# Patient Record
Sex: Female | Born: 1935 | Race: Black or African American | Hispanic: No | Smoking: Never smoker
Health system: Southern US, Community
[De-identification: ages and names within clinical notes are randomized; demographics above are authoritative.]

## PROBLEM LIST (undated history)

## (undated) DIAGNOSIS — E785 Hyperlipidemia, unspecified: Secondary | ICD-10-CM

## (undated) DIAGNOSIS — K219 Gastro-esophageal reflux disease without esophagitis: Secondary | ICD-10-CM

## (undated) DIAGNOSIS — R9439 Abnormal result of other cardiovascular function study: Secondary | ICD-10-CM

## (undated) DIAGNOSIS — E079 Disorder of thyroid, unspecified: Secondary | ICD-10-CM

## (undated) DIAGNOSIS — M069 Rheumatoid arthritis, unspecified: Secondary | ICD-10-CM

## (undated) DIAGNOSIS — Z90710 Acquired absence of both cervix and uterus: Secondary | ICD-10-CM

## (undated) DIAGNOSIS — I10 Essential (primary) hypertension: Secondary | ICD-10-CM

## (undated) DIAGNOSIS — H269 Unspecified cataract: Secondary | ICD-10-CM

## (undated) DIAGNOSIS — G4730 Sleep apnea, unspecified: Secondary | ICD-10-CM

## (undated) DIAGNOSIS — E119 Type 2 diabetes mellitus without complications: Secondary | ICD-10-CM

## (undated) DIAGNOSIS — T7840XA Allergy, unspecified, initial encounter: Secondary | ICD-10-CM

## (undated) DIAGNOSIS — F419 Anxiety disorder, unspecified: Secondary | ICD-10-CM

## (undated) HISTORY — DX: Abnormal result of other cardiovascular function study: R94.39

## (undated) HISTORY — DX: Gastro-esophageal reflux disease without esophagitis: K21.9

## (undated) HISTORY — DX: Essential (primary) hypertension: I10

## (undated) HISTORY — DX: Allergy, unspecified, initial encounter: T78.40XA

## (undated) HISTORY — DX: Type 2 diabetes mellitus without complications: E11.9

## (undated) HISTORY — DX: Unspecified cataract: H26.9

## (undated) HISTORY — DX: Disorder of thyroid, unspecified: E07.9

## (undated) HISTORY — DX: Anxiety disorder, unspecified: F41.9

## (undated) HISTORY — DX: Acquired absence of both cervix and uterus: Z90.710

## (undated) HISTORY — PX: TOTAL ABDOMINAL HYSTERECTOMY: SHX209

## (undated) HISTORY — DX: Hyperlipidemia, unspecified: E78.5

---

## 2003-08-09 ENCOUNTER — Emergency Department (HOSPITAL_COMMUNITY): Admission: EM | Admit: 2003-08-09 | Discharge: 2003-08-09 | Payer: Self-pay | Admitting: Emergency Medicine

## 2004-10-28 ENCOUNTER — Ambulatory Visit: Payer: Self-pay | Admitting: *Deleted

## 2010-07-21 ENCOUNTER — Ambulatory Visit: Payer: Self-pay | Admitting: Rheumatology

## 2010-07-29 ENCOUNTER — Ambulatory Visit: Payer: Self-pay | Admitting: Rheumatology

## 2013-09-19 ENCOUNTER — Encounter: Payer: Self-pay | Admitting: *Deleted

## 2013-09-20 ENCOUNTER — Encounter: Payer: Self-pay | Admitting: Cardiology

## 2013-09-20 ENCOUNTER — Ambulatory Visit (INDEPENDENT_AMBULATORY_CARE_PROVIDER_SITE_OTHER): Payer: Medicare Other | Admitting: Cardiology

## 2013-09-20 VITALS — BP 121/80 | HR 88 | Ht 67.0 in | Wt 190.0 lb

## 2013-09-20 DIAGNOSIS — E119 Type 2 diabetes mellitus without complications: Secondary | ICD-10-CM

## 2013-09-20 DIAGNOSIS — E785 Hyperlipidemia, unspecified: Secondary | ICD-10-CM

## 2013-09-20 DIAGNOSIS — I10 Essential (primary) hypertension: Secondary | ICD-10-CM

## 2013-09-20 DIAGNOSIS — R9439 Abnormal result of other cardiovascular function study: Secondary | ICD-10-CM

## 2013-09-20 HISTORY — DX: Abnormal result of other cardiovascular function study: R94.39

## 2013-09-20 HISTORY — DX: Hyperlipidemia, unspecified: E78.5

## 2013-09-20 HISTORY — DX: Essential (primary) hypertension: I10

## 2013-09-20 HISTORY — DX: Type 2 diabetes mellitus without complications: E11.9

## 2013-09-20 NOTE — Progress Notes (Signed)
[address] [place], [place]  [address] Phone: [phone] Fax:  [phone]  Date:  09/20/2013   ID:  Kristen Weber, DOB [birth year], MRN [id number]  PCP:  No primary provider on file.   History of Present Illness: Kristen Weber is a 79 y.o. female with diabetes, hypertension, hyperlipidemia who underwent nuclear stress test at [facility] interpreted as probably abnormal myocardial perfusion study with a large, mildly severe mid anterior, mid anteroseptal, apical anterior, apical septal and apical nearly completely reversible defect suggestive of probable ischemia or possible attenuation artifact. There is moderate motion artifact, sensitivity, specificity of the study is reduced by the noted artifact. No TID.  Saw Dr. Julio Alm in [place]. Leo Grosser MD is her PCP. She is not having any particular symptoms. No CP, no SOB. One time in the past had mild CP after eating perhaps.   No prior heart issues. Perhaps heart murmur in the past.     Wt Readings from Last 3 Encounters:  09/20/13 190 lb (86.183 kg)     Past Medical History  Diagnosis Date  . H/O: hysterectomy   . Diabetes mellitus     Type 2    No past surgical history on file.  Current Outpatient Prescriptions  Medication Sig Dispense Refill  . GLIPIZIDE XL 10 MG 24 hr tablet Take 10 mg by mouth daily.       . hydrochlorothiazide (HYDRODIURIL) 25 MG tablet Take 25 mg by mouth daily.       Marland Kitchen ibuprofen (ADVIL,MOTRIN) 800 MG tablet Take by mouth as needed.       Marland Kitchen LANTUS SOLOSTAR 100 UNIT/ML SOPN 30 Units daily.       Marland Kitchen losartan (COZAAR) 50 MG tablet Take 50 mg by mouth daily.       Marland Kitchen NITROSTAT 0.4 MG SL tablet Place 0.4 mg under the tongue every 5 (five) minutes as needed.       Marland Kitchen oxyCODONE-acetaminophen (PERCOCET/ROXICET) 5-325 MG per tablet Take by mouth as directed.       . simvastatin (ZOCOR) 20 MG tablet Take 20 mg by mouth at bedtime.       . traMADol (ULTRAM) 50 MG tablet Take 50 mg by mouth as  directed.        No current facility-administered medications for this visit.    Allergies:   No Known Allergies  Social History:  The patient  reports that she has never smoked. She does not have any smokeless tobacco history on file. She reports that she does not drink alcohol or use illicit drugs.   Family History  Problem Relation Age of Onset  . Diabetes Mother     ROS:  Please see the history of present illness.   Denies any bleeding, syncope, orthopnea, PND, rash, dysphagia.   All other systems reviewed and negative.   PHYSICAL EXAM: VS:  BP 121/80  Pulse 88  Ht 5\' 7"  (1.702 m)  Wt 190 lb (86.183 kg)  BMI 29.75 kg/m2 Well nourished, well developed, in no acute distressAmbulates with a cane HEENT: normal, [place]/AT, EOMI Neck: no JVD, normal carotid upstroke, no bruit Cardiac:  normal S1, S2; RRR; no murmur Lungs:  clear to auscultation bilaterally, no wheezing, rhonchi or rales Abd: soft, nontender, no hepatomegaly, no bruits Ext: no edema, 2+ distal pulses Skin: warm and dry GU: deferred Neuro: no focal abnormalities noted, AAO x 3  EKG:  Sinus rhythm, 88, left axis deviation, probable left atrial enlargement  ASSESSMENT AND PLAN:  1. Probably abnormal cardiovascular stress test/nuclear stress test - with equivocal nature of stress test, no TID, ejection fraction 65%, 5 minutes on treadmill with findings consistent with possible mild ischemia versus breast attenuation and current lack of symptoms, no chest pain, no shortness of breath, I would like to continue to monitor her and continue with aggressive primary prevention especially in light of her diabetes. If chest discomfort returns or other worrisome symptoms develop, I will pursue cardiac catheterization. We discussed the risks and benefits of cardiac catheterization including stroke, heart attack, death. I explained to them that if her stress test result or more conclusive of ischemia and if she were having ongoing  chest discomfort/angina that we would certainly be pursuing catheterization. 2. Diabetes-per primary physician. 3. Hypertension-currently well controlled. No changes made.  4. Hyperlipidemia-continue with statin. Excellent prevention. 5. I will see her back in 6 months or sooner if symptoms occur.  Signed, Donato Schultz, MD [facility]  09/20/2013 3:49 PM

## 2013-09-20 NOTE — Patient Instructions (Signed)
Your physician recommends that you continue on your current medications as directed. Please refer to the Current Medication list given to you today.  Your physician wants you to follow-up in: 6 months with Dr. Skains. You will receive a reminder letter in the mail two months in advance. If you don't receive a letter, please call our office to schedule the follow-up appointment.  

## 2013-09-26 ENCOUNTER — Encounter: Payer: Self-pay | Admitting: Cardiology

## 2013-11-27 ENCOUNTER — Encounter: Payer: Self-pay | Admitting: Neurology

## 2013-11-27 ENCOUNTER — Ambulatory Visit (INDEPENDENT_AMBULATORY_CARE_PROVIDER_SITE_OTHER): Payer: Medicare Other | Admitting: Neurology

## 2013-11-27 VITALS — BP 128/80 | HR 101 | Ht 65.0 in | Wt 192.0 lb

## 2013-11-27 DIAGNOSIS — R251 Tremor, unspecified: Secondary | ICD-10-CM | POA: Insufficient documentation

## 2013-11-27 DIAGNOSIS — R259 Unspecified abnormal involuntary movements: Secondary | ICD-10-CM

## 2013-11-27 MED ORDER — CARBIDOPA-LEVODOPA 25-100 MG PO TABS: 1.0000 | ORAL_TABLET | Freq: Three times a day (TID) | ORAL | Status: DC

## 2013-11-27 NOTE — Patient Instructions (Signed)
Overall you are doing fairly well but I do want to suggest a few things today:   Remember to drink plenty of fluid, eat healthy meals and do not skip any meals. Try to eat protein with a every meal and eat a healthy snack such as fruit or nuts in between meals. Try to keep a regular sleep-wake schedule and try to exercise daily, particularly in the form of walking, 20-30 minutes a day, if you can.   As far as your medications are concerned, I would like to suggest the following: 1)Start Sinemet 25-100 1/2 tablet 3 times a day at 8am, noon and 4pm. Do this for one week and then increase to 1 tablet three times a day at 8am, noon and 4pm.   I would like to see you back in 3 to 4 months sooner if we need to. Please call us with any interim questions, concerns, problems, updates or refill requests.   My clinical assistant and will answer any of your questions and relay your messages to me and also relay most of my messages to you.   Our phone number is [phone]. We also have an after hours call service for urgent matters and there is a physician on-call for urgent questions. For any emergencies you know to call 911 or go to the nearest emergency room

## 2013-11-27 NOTE — Progress Notes (Signed)
[facility]    Provider:  Dr Hosie Poisson Referring Provider: Barron Alvine, MD Primary Care Physician:  No primary provider on file.  CC:  tremor  HPI:  Kristen Weber is a 80 y.o. female here as a referral from Dr. Hollice Espy for tremor  Tremor started around a year ago, seems to be getting worse. Unsure if started unilateral or bilateral. No rest tremor, more of an action/postural tremor. Causing difficulty eating. Shaking of hand when holding cup. Handwriting has gotten messier and smaller. No change in voice. Has some muscle stiffness. Feels she has slowed down, not walking as fast, feels she shuffles some. No known history of REM behavior disorder. No aggravating factors for tremors. No change with caffeine. Does not drink EtOH. No family history of tremor. Memory is sharp. Normal sense of smell.   Has history of DM, HTN and chronic arthritic pain.   Review of Systems: Out of a complete 14 system review, the patient complains of only the following symptoms, and all other reviewed systems are negative. Positive weight loss fatigue blurred vision loss of vision eye pain easy bruising snoring restless legs difficulty swallowing tremor feeling cold snoring chest pain trouble swallowing constipation aching muscles cramps change in appetite  History   Social History  . Marital Status: Married    Spouse Name: N/A    Number of Children: N/A  . Years of Education: N/A   Occupational History  . Retired    Social History Main Topics  . Smoking status: Never Smoker   . Smokeless tobacco: Never Used  . Alcohol Use: No  . Drug Use: No  . Sexual Activity: Not on file   Other Topics Concern  . Not on file   Social History Narrative   Patient lives with her husband Greggory Stallion    Patient has 2 children.    Patient is retired.   Patient is right-handed.      Family History  Problem Relation Age of Onset  . Diabetes Mother     Past Medical History  Diagnosis Date  . H/O:  hysterectomy   . Diabetes mellitus     Type 2  . Abnormal cardiovascular stress test 09/20/2013    Possibly abnormal nuclear stress at [facility]-anteroseptal abnormality interpreted as possible breast attenuation as well, motion artifact. Normal EF. No TID. Watching medically  . Diabetes 09/20/2013  . Essential hypertension 09/20/2013  . Hyperlipidemia 09/20/2013    Past Surgical History  Procedure Laterality Date  . Total abdominal hysterectomy      Current Outpatient Prescriptions  Medication Sig Dispense Refill  . GLIPIZIDE XL 10 MG 24 hr tablet Take 10 mg by mouth daily.       . hydrochlorothiazide (HYDRODIURIL) 25 MG tablet Take 25 mg by mouth daily.       Marland Kitchen LANTUS SOLOSTAR 100 UNIT/ML SOPN 30 Units daily.       Marland Kitchen losartan (COZAAR) 50 MG tablet Take 50 mg by mouth daily.       Marland Kitchen NITROSTAT 0.4 MG SL tablet Place 0.4 mg under the tongue every 5 (five) minutes as needed.       Marland Kitchen oxyCODONE-acetaminophen (PERCOCET/ROXICET) 5-325 MG per tablet Take by mouth as directed.       . simvastatin (ZOCOR) 20 MG tablet Take 20 mg by mouth at bedtime.       . traMADol (ULTRAM) 50 MG tablet Take 50 mg by mouth as directed.        No current facility-administered medications  for this visit.    Allergies as of 11/27/2013  . (No Known Allergies)    Vitals: BP 128/80  Pulse 101  Ht 5\' 5"  (1.651 m)  Wt 192 lb (87.091 kg)  BMI 31.95 kg/m2 Last Weight:  Wt Readings from Last 1 Encounters:  11/27/13 192 lb (87.091 kg)   Last Height:   Ht Readings from Last 1 Encounters:  11/27/13 5\' 5"  (1.651 m)     Physical exam: Exam: Gen: NAD, conversant Eyes: anicteric sclerae, moist conjunctivae HENT: Atraumatic, oropharynx clear Neck: Trachea midline; supple,  Lungs: CTA, no wheezing, rales, rhonic                          CV: RRR, no MRG Abdomen: Soft, non-tender;  Extremities: No peripheral edema  Skin: Normal temperature, no rash,  Psych: Appropriate affect, pleasant  Neuro: MS:  AA&Ox3, appropriately interactive, normal affect   Speech: fluent w/o paraphasic error  Memory: good recent and remote recall  CN: PERRL, EOMI no nystagmus, no ptosis, sensation intact to LT V1-V3 bilat, face symmetric, no weakness, hearing grossly intact, palate elevates symmetrically, shoulder shrug 5/5 bilat,  tongue protrudes midline, no fasiculations noted.  Motor: normal bulk and tone Strength: 5/5  In all extremities  Coord: rapid alternating and point-to-point (FNF, HTS) movements intact. No rest tremor noted. No action or postural tremor noted. Mild right greater left intention tremor of hands. Patient is tremor is usually worse in this. Bradykinesia noted in bilateral hands with finger opening and closing, finger tapping and rapid alternating movements. Bradykinesia with foot tapping.   Reflexes: symmetrical, bilat downgoing toes  Sens: LT intact in all extremities  Gait: Stands slowly without assistance, mildly stooped, shuffling gait, appears antalgic gait favoring right leg, decreased arm swing left greater than right no re emergent tremor.  Assessment and Plan:  1)Tremor:   80 year old right-handed woman presenting for initial evaluation of tremor, described only as a traction postural tremor. Minimal intention tremor noted on exam, otherwise no tremor noted. Patient does have multiple parkinsonian features such as bradykinesia, shuffling gait, decreased arm swing. Patient does have strong arthritis history, so unclear how much of the the bradykinesia is due to arthritic changes. Patient's clinical history is consistent more with an essential tremor, but the physical exam shows no signs essential tremor and is positive for some parkinsonian features. There is often an overlap between these 2, though at this time suspect patient likely has an underlying parkinsonism. The possible diagnosis was discussed extensively with patient and her daughter. Expressed understanding. We  discussed different options, including DAT scan versus try patient on low-dose levodopa to see if there is a therapeutic benefit. Benefits and risks of both options were discussed and patient understanding. At this time will try patient on low-dose Sinemet 3 times a day. Patient followup in 3-4 months.   , DO  [facility] [address] [place], [address] [place]  Phone [phone] Fax [phone]

## 2014-02-05 ENCOUNTER — Telehealth: Payer: Self-pay | Admitting: Neurology

## 2014-02-05 NOTE — Telephone Encounter (Signed)
Patient came to the office today to relay that she tried the Sinemet IR for two weeks and didn't see any change, she is no longer taking med.  She just wanted the doctor to know.  She has a revisit on 03-05-14.

## 2014-03-05 ENCOUNTER — Ambulatory Visit (INDEPENDENT_AMBULATORY_CARE_PROVIDER_SITE_OTHER): Payer: Medicare Other | Admitting: Neurology

## 2014-03-05 ENCOUNTER — Encounter: Payer: Self-pay | Admitting: Neurology

## 2014-03-05 VITALS — BP 126/83 | HR 99 | Ht 65.0 in | Wt 194.0 lb

## 2014-03-05 DIAGNOSIS — R259 Unspecified abnormal involuntary movements: Secondary | ICD-10-CM

## 2014-03-05 DIAGNOSIS — R251 Tremor, unspecified: Secondary | ICD-10-CM

## 2014-03-05 MED ORDER — PSYLLIUM 0.52 G PO CAPS: 0.5200 g | ORAL_CAPSULE | Freq: Every day | ORAL | Status: DC

## 2014-03-05 MED ORDER — PROPRANOLOL HCL 10 MG PO TABS: 10.0000 mg | ORAL_TABLET | Freq: Two times a day (BID) | ORAL | Status: DC

## 2014-03-05 NOTE — Progress Notes (Signed)
[facility]    Provider:  Dr Hosie Poisson Referring Provider: No ref. provider found Primary Care Physician:  No primary provider on file.  CC:  tremor  HPI:  Kristen Weber is a 80 y.o. female here as a follow up for tremor. Returns today with continued tremor and concern over continued pain in bilateral knees. Scheduled to follow up for aquatic therapy for treatment. No other acute concerns at this time.   Initial visit 11/2013: Tremor started around a year ago, seems to be getting worse. Unsure if started unilateral or bilateral. No rest tremor, more of an action/postural tremor. Causing difficulty eating. Shaking of hand when holding cup. Handwriting has gotten messier and smaller. No change in voice. Has some muscle stiffness. Feels she has slowed down, not walking as fast, feels she shuffles some. No known history of REM behavior disorder. No aggravating factors for tremors. No change with caffeine. Does not drink EtOH. No family history of tremor. Memory is sharp. Normal sense of smell.   Has history of DM, HTN and chronic arthritic pain.   Review of Systems: Out of a complete 14 system review, the patient complains of only the following symptoms, and all other reviewed systems are negative. Positive loss of vision, joint pain, restless leg, back pain, aching muscles, muscle cramps, walking difficulty, tremors  History   Social History  . Marital Status: Married    Spouse Name: N/A    Number of Children: N/A  . Years of Education: N/A   Occupational History  . Retired    Social History Main Topics  . Smoking status: Never Smoker   . Smokeless tobacco: Never Used  . Alcohol Use: No  . Drug Use: No  . Sexual Activity: Not on file   Other Topics Concern  . Not on file   Social History Narrative   Patient lives with her husband Greggory Stallion    Patient has 2 children.    Patient is retired.   Patient is right-handed.      Family History  Problem Relation Age  of Onset  . Diabetes Mother     Past Medical History  Diagnosis Date  . H/O: hysterectomy   . Diabetes mellitus     Type 2  . Abnormal cardiovascular stress test 09/20/2013    Possibly abnormal nuclear stress at [facility]-anteroseptal abnormality interpreted as possible breast attenuation as well, motion artifact. Normal EF. No TID. Watching medically  . Diabetes 09/20/2013  . Essential hypertension 09/20/2013  . Hyperlipidemia 09/20/2013    Past Surgical History  Procedure Laterality Date  . Total abdominal hysterectomy      Current Outpatient Prescriptions  Medication Sig Dispense Refill  . ACCU-CHEK AVIVA PLUS test strip       . carbidopa-levodopa (SINEMET IR) 25-100 MG per tablet Take 1 tablet by mouth 3 (three) times daily.  90 tablet  6  . GLIPIZIDE XL 10 MG 24 hr tablet Take 10 mg by mouth daily.       . hydrochlorothiazide (HYDRODIURIL) 25 MG tablet Take 25 mg by mouth daily.       Marland Kitchen HYDROcodone-acetaminophen (NORCO/VICODIN) 5-325 MG per tablet       . LANTUS SOLOSTAR 100 UNIT/ML SOPN 30 Units daily.       Marland Kitchen losartan (COZAAR) 50 MG tablet Take 50 mg by mouth daily.       Marland Kitchen NITROSTAT 0.4 MG SL tablet Place 0.4 mg under the tongue every 5 (five) minutes as needed.       Marland Kitchen  oxyCODONE-acetaminophen (PERCOCET/ROXICET) 5-325 MG per tablet Take by mouth as directed.       . polyethylene glycol powder (GLYCOLAX/MIRALAX) powder       . simvastatin (ZOCOR) 20 MG tablet Take 20 mg by mouth at bedtime.       . traMADol (ULTRAM) 50 MG tablet Take 50 mg by mouth as directed.       . propranolol (INDERAL) 10 MG tablet Take 1 tablet (10 mg total) by mouth 2 (two) times daily.  60 tablet  3  . psyllium (REGULOID) 0.52 G capsule Take 1 capsule (0.52 g total) by mouth daily.  30 capsule  6   No current facility-administered medications for this visit.    Allergies as of 03/05/2014  . (No Known Allergies)    Vitals: BP 126/83  Pulse 99  Ht 5\' 5"  (1.651 m)  Wt 194 lb (87.998 kg)   BMI 32.28 kg/m2 Last Weight:  Wt Readings from Last 1 Encounters:  03/05/14 194 lb (87.998 kg)   Last Height:   Ht Readings from Last 1 Encounters:  03/05/14 5\' 5"  (1.651 m)     Physical exam: Exam: Gen: NAD, conversant Eyes: anicteric sclerae, moist conjunctivae HENT: Atraumatic, oropharynx clear Neck: Trachea midline; supple,  Lungs: CTA, no wheezing, rales, rhonic                          CV: RRR, no MRG Abdomen: Soft, non-tender;  Extremities: No peripheral edema  Skin: Normal temperature, no rash,  Psych: Appropriate affect, pleasant  Neuro: MS: AA&Ox3, appropriately interactive, normal affect   Speech: fluent w/o paraphasic error  Memory: good recent and remote recall  CN: PERRL, EOMI no nystagmus, no ptosis, sensation intact to LT V1-V3 bilat, face symmetric, no weakness, hearing grossly intact, palate elevates symmetrically, shoulder shrug 5/5 bilat,  tongue protrudes midline, no fasiculations noted.  Motor: normal bulk and tone Strength: 5/5  In all extremities  Coord: rapid alternating and point-to-point (FNF, HTS) movements intact. No rest tremor noted. No action or postural tremor noted. Mild right greater left intention tremor of hands. Patient states tremor is usually worse than this. Bradykinesia noted in bilateral hands with finger opening and closing, finger tapping and rapid alternating movements. Bradykinesia with foot tapping.   Reflexes: symmetrical, bilat downgoing toes  Sens: LT intact in all extremities  Gait: Stands slowly without assistance, mildly stooped, shuffling gait, appears antalgic gait favoring right leg, decreased arm swing left greater than right no re emergent tremor.  Assessment and Plan:  1)Tremor:   80 year old right-handed woman presenting for initial evaluation of tremor, described only as a action/intention tremor. Minimal intention tremor noted on exam but otherwise unremarkable. Tremor appears very mild but patient notes  it is negatively impacting her quality of life. Based on history and exam suspect this likely represents a mild essential tremor. Patient does have some bradykinesia on exam but this may represent symptoms of arthritis. The possible diagnosis was discussed extensively with patient and her daughter. Expressed understanding. Will try low dose beta blocker, can titrate up as tolerated. Follow up in 6 months.   , DO  [facility] [address] [place], [address] [place]  Phone [phone] Fax [phone]

## 2014-03-05 NOTE — Patient Instructions (Signed)
Overall you are doing fairly well but I do want to suggest a few things today:   Remember to drink plenty of fluid, eat healthy meals and do not skip any meals. Try to eat protein with a every meal and eat a healthy snack such as fruit or nuts in between meals. Try to keep a regular sleep-wake schedule and try to exercise daily, particularly in the form of walking, 20-30 minutes a day, if you can.   As far as your medications are concerned, I would like to suggest the following: 1)Start taking Propanolol 10mg  nightly for 1 week and then increase to 10mg  twice a day  I would like to see you back in 6 months, sooner if we need to. Please call with any interim questions, concerns, problems, updates or refill requests.   Please also call for any test results so we can go over those with you on the phone.  My clinical assistant and will answer any of your questions and relay your messages to me and also relay most of my messages to you.   Our phone number is [phone]. We also have an after hours call service for urgent matters and there is a physician on-call for urgent questions. For any emergencies you know to call 911 or go to the nearest emergency room

## 2014-03-06 ENCOUNTER — Telehealth: Payer: Self-pay | Admitting: Neurology

## 2014-03-06 NOTE — Telephone Encounter (Signed)
Spoke with patient, she states that her Rx for Tramadol 50 mg, has ran out and is requesting another Rx, Please advise

## 2014-03-06 NOTE — Telephone Encounter (Signed)
Informed patient that Rx refill will need to be obtained from prescribing doctor, patient expressed understanding, no further questions or concerns.

## 2014-03-06 NOTE — Telephone Encounter (Signed)
Please let her know that I did not order this and she needs to follow up with the prescribing doctor to get a refill

## 2014-09-05 ENCOUNTER — Ambulatory Visit: Payer: Medicare Other | Admitting: Neurology

## 2014-10-17 ENCOUNTER — Ambulatory Visit
Admission: RE | Admit: 2014-10-17 | Discharge: 2014-10-17 | Disposition: A | Discharge status: Home / Self Care | Payer: Medicare Other | Source: Ambulatory Visit | Attending: Nurse Practitioner | Admitting: Nurse Practitioner

## 2014-10-17 ENCOUNTER — Encounter: Payer: Self-pay | Admitting: Nurse Practitioner

## 2014-10-17 ENCOUNTER — Telehealth: Payer: Self-pay

## 2014-10-17 ENCOUNTER — Other Ambulatory Visit: Payer: Self-pay | Admitting: Nurse Practitioner

## 2014-10-17 ENCOUNTER — Inpatient Hospital Stay: Admission: RE | Admit: 2014-10-17 | Payer: Medicare Other | Source: Ambulatory Visit

## 2014-10-17 ENCOUNTER — Ambulatory Visit (INDEPENDENT_AMBULATORY_CARE_PROVIDER_SITE_OTHER): Payer: Medicare Other | Admitting: Nurse Practitioner

## 2014-10-17 VITALS — BP 110/80 | HR 97 | Ht 63.0 in | Wt 190.1 lb

## 2014-10-17 DIAGNOSIS — E119 Type 2 diabetes mellitus without complications: Secondary | ICD-10-CM

## 2014-10-17 DIAGNOSIS — I209 Angina pectoris, unspecified: Secondary | ICD-10-CM

## 2014-10-17 DIAGNOSIS — Z5181 Encounter for therapeutic drug level monitoring: Secondary | ICD-10-CM

## 2014-10-17 DIAGNOSIS — E785 Hyperlipidemia, unspecified: Secondary | ICD-10-CM

## 2014-10-17 DIAGNOSIS — I10 Essential (primary) hypertension: Secondary | ICD-10-CM

## 2014-10-17 DIAGNOSIS — R9439 Abnormal result of other cardiovascular function study: Secondary | ICD-10-CM

## 2014-10-17 DIAGNOSIS — R9389 Abnormal findings on diagnostic imaging of other specified body structures: Secondary | ICD-10-CM

## 2014-10-17 LAB — LIPID PANEL
Cholesterol: 184 mg/dL (ref 0–200)
HDL: 44.8 mg/dL (ref >39.00)
NonHDL: 139.2
Total CHOL/HDL Ratio: 4
Triglycerides: 311 mg/dL — ABNORMAL HIGH (ref 0.0–149.0)
VLDL: 62.2 mg/dL — ABNORMAL HIGH (ref 0.0–40.0)

## 2014-10-17 LAB — PROTIME-INR
INR: 1.2 ratio — ABNORMAL HIGH (ref 0.8–1.0)
Prothrombin Time: 13.5 s — ABNORMAL HIGH (ref 9.6–13.1)

## 2014-10-17 LAB — BASIC METABOLIC PANEL
BUN: 16 mg/dL (ref 6–23)
CO2: 28 mEq/L (ref 19–32)
Calcium: 10.4 mg/dL (ref 8.4–10.5)
Chloride: 98 mEq/L (ref 96–112)
Creatinine, Ser: 1.1 mg/dL (ref 0.4–1.2)
GFR: 61.12 mL/min (ref >60.00)
Glucose, Bld: 105 mg/dL — ABNORMAL HIGH (ref 70–99)
Potassium: 3.9 mEq/L (ref 3.5–5.1)
Sodium: 135 mEq/L (ref 135–145)

## 2014-10-17 LAB — HEPATIC FUNCTION PANEL
ALT: 12 U/L (ref 0–35)
AST: 19 U/L (ref 0–37)
Albumin: 3.7 g/dL (ref 3.5–5.2)
Alkaline Phosphatase: 69 U/L (ref 39–117)
Bilirubin, Direct: 0 mg/dL (ref 0.0–0.3)
Total Bilirubin: 0.4 mg/dL (ref 0.2–1.2)
Total Protein: 7.6 g/dL (ref 6.0–8.3)

## 2014-10-17 LAB — CBC
HCT: 39.7 % (ref 36.0–46.0)
Hemoglobin: 12.9 g/dL (ref 12.0–15.0)
MCHC: 32.5 g/dL (ref 30.0–36.0)
MCV: 88.2 fl (ref 78.0–100.0)
Platelets: 299 10*3/uL (ref 150.0–400.0)
RBC: 4.5 Mil/uL (ref 3.87–5.11)
RDW: 14.4 % (ref 11.5–15.5)
WBC: 3.6 10*3/uL — ABNORMAL LOW (ref 4.0–10.5)

## 2014-10-17 LAB — LDL CHOLESTEROL, DIRECT: Direct LDL: 64.8 mg/dL

## 2014-10-17 LAB — APTT: aPTT: 36 s — ABNORMAL HIGH (ref 23.4–32.7)

## 2014-10-17 LAB — HEMOGLOBIN A1C: Hgb A1c MFr Bld: 7.9 % — ABNORMAL HIGH (ref 4.6–6.5)

## 2014-10-17 MED ORDER — METOPROLOL TARTRATE 25 MG PO TABS: 25.0000 mg | ORAL_TABLET | Freq: Two times a day (BID) | ORAL | Status: DC

## 2014-10-17 MED ORDER — NITROGLYCERIN 0.4 MG SL SUBL: 0.4000 mg | SUBLINGUAL_TABLET | SUBLINGUAL | Status: DC | PRN

## 2014-10-17 MED ORDER — ISOSORBIDE MONONITRATE ER 30 MG PO TB24: 30.0000 mg | ORAL_TABLET | Freq: Every day | ORAL | Status: DC

## 2014-10-17 NOTE — Telephone Encounter (Signed)
Patient called no answer.Left message on personal answering machine to call me.I received message from Norma Fredrickson NP you need to have a chest ct this Friday 10/19/14.Call me back for time.

## 2014-10-17 NOTE — Progress Notes (Addendum)
Kristen Weber Date of Birth: 25-Aug-1934 Medical Record #[id number]  History of Present Illness: Kristen Weber is seen today for a work in visit. Seen for Dr. Anne Fu. She is a 81 y.o. female with diabetes, hypertension, hyperlipidemia, OA and tremor who underwent nuclear stress test noted in June of 2014 at [facility] interpreted as probably abnormal myocardial perfusion study with a large, mildly severe mid anterior, mid anteroseptal, apical anterior, apical septal and apical nearly completely reversible defect suggestive of probable ischemia or possible attenuation artifact. There is moderate motion artifact, sensitivity, specificity of the study is reduced by the noted artifact. No TID. She has been managed medically. She previously had no symptoms reported.  Saw Dr. Anne Fu a little over one year ago. He favored continued medical management but noted that he would pursue cardiac cath if she became symptomatic.   Comes in today. Here with her daughter. She has basically done ok over the past year until about 3 weeks ago. Has started having "heaviness" over her left breast - typically occurs in the mornings but she can have later in the day. She has associated shortness of breath. No radiation of the discomfort. She has more fatigue. Sometimes will last a few minutes - other times up to an hour or so. She has not used NTG - did not think to use that. Symptoms occur at rest. She does not exercise. Limited by arthritis. BP ok. Says her sugars are ok but not able to tell me her A1C and does not check her sugars. She was planning on having cataract surgery on Monday.   Current Outpatient Prescriptions  Medication Sig Dispense Refill  . ACCU-CHEK AVIVA PLUS test strip     . aspirin 81 MG tablet Take 81 mg by mouth.    . carbidopa-levodopa (SINEMET IR) 25-100 MG per tablet Take 1 tablet by mouth 3 (three) times daily. 90 tablet 6  . GLIPIZIDE XL 10 MG 24 hr tablet Take 10 mg by mouth daily.     Marland Kitchen glucose blood  test strip     . hydrochlorothiazide (HYDRODIURIL) 25 MG tablet Take 25 mg by mouth daily.     Marland Kitchen LANTUS SOLOSTAR 100 UNIT/ML SOPN 30 Units daily.     Marland Kitchen losartan (COZAAR) 50 MG tablet Take 50 mg by mouth daily.     Marland Kitchen NITROSTAT 0.4 MG SL tablet Place 0.4 mg under the tongue every 5 (five) minutes as needed.     Marland Kitchen omeprazole (PRILOSEC) 20 MG capsule Take 20 mg by mouth.    . oxyCODONE-acetaminophen (PERCOCET/ROXICET) 5-325 MG per tablet Take by mouth as directed.     . propranolol (INDERAL) 10 MG tablet Take 1 tablet (10 mg total) by mouth 2 (two) times daily. 60 tablet 3  . senna-docusate (SENOKOT-S) 8.6-50 MG per tablet Take by mouth.    . simvastatin (ZOCOR) 20 MG tablet Take 20 mg by mouth at bedtime.     . traMADol (ULTRAM) 50 MG tablet Take 50 mg by mouth as directed.      No current facility-administered medications for this visit.    Allergies  Allergen Reactions  . Lisinopril Cough    cough    Past Medical History  Diagnosis Date  . H/O: hysterectomy   . Diabetes mellitus     Type 2  . Abnormal cardiovascular stress test 09/20/2013    Possibly abnormal nuclear stress at [place]-anteroseptal abnormality interpreted as possible breast attenuation as well, motion artifact. Normal EF. No TID. Watching medically  .  Diabetes 09/20/2013  . Essential hypertension 09/20/2013  . Hyperlipidemia 09/20/2013    Past Surgical History  Procedure Laterality Date  . Total abdominal hysterectomy      History  Smoking status  . Never Smoker   Smokeless tobacco  . Never Used    History  Alcohol Use No    Family History  Problem Relation Age of Onset  . Diabetes Mother     Review of Systems: The review of systems is per the HPI.  All other systems were reviewed and are negative.  Physical Exam: BP 110/80 mmHg  Pulse 97  Ht 5\' 3"  (1.6 m)  Wt 190 lb 1.9 oz (86.238 kg)  BMI 33.69 kg/m2 Patient is very pleasant and in no acute distress. Skin is warm and dry. Color is  normal.  HEENT is unremarkable. Normocephalic/atraumatic. PERRL. Sclera are nonicteric. Neck is supple. No masses. No JVD. Lungs are clear. Cardiac exam shows a regular rate and rhythm -  Occasional ectopic.  Abdomen is obese but soft. Extremities are without edema. Gait and ROM are intact. Using a cane. No gross neurologic deficits noted.  Wt Readings from Last 3 Encounters:  10/17/14 190 lb 1.9 oz (86.238 kg)  03/05/14 194 lb (87.998 kg)  11/27/13 192 lb (87.091 kg)    LABORATORY DATA/PROCEDURES: EKG today with NSR - PVC  No results found for: WBC, HGB, HCT, PLT, GLUCOSE, CHOL, TRIG, HDL, LDLDIRECT, LDLCALC, ALT, AST, NA, K, CL, CREATININE, BUN, CO2, TSH, PSA, INR, GLUF, HGBA1C, MICROALBUR  BNP (last 3 results) No results for input(s): PROBNP in the last 8760 hours.  Nuclear Cardiology Report From Care Everywhere Dated June 2014 Impression: 1.  Probably Abnormal myocardial perfusion study. 2.  There is a large, mildly severe mid anterior, mid anteroseptal, apical  anterior, apical septal and apical nearly completely reversible defect  suggestive of probable ischemia or possible attenuation artifact. 3.  There is moderate motion artifact, sensitivity specificity of the  study is reduced by the noted artifact.  Ordering Provider:  July 2014 Indication:     80 y.o. female presents with complaint of chest pain. Cardiac Risk Factors: Diabetic, hypertension, hyperlipidemia  Procedure:  SPECT myocardial perfusion with rest and stress imaging  Technique:  The patient was brought to the nuclear medicine area and after  informa consent was obtained an IV was started.  The patient then received   10.7 mCi of Tc 99-m sestamibi.  Approximately 30 minutes later resting  images were acquired.  Patient then underwent stress testing utilizing  Bruce protocol and achieved target heart.  At peak stress the patient was  injected with 32.0 mCi of  Tc 99-m sestamibi.  Approximately one hour  later  stress images were obtained.   EKG findings: Baseline EKG Findings: Normal sinus rhythm with occasional PVC Baseline Heart Rate:  88 Baseline BP   110/78 Target Heart Rate:  118 Peak Heart Rate:  136 Peak Blood Pressure:  182/90 Exercise Time:  5 minutes and 0 seconds Workload:   7.0 METs Chest Pain:   none Arrhythmias:   PVCs EKG Findings:   Patient had bigeminy the start of exercise which resolved  after 1 minute.  Nuclear images findings: Rest and stress images were reviewed.  There was a large size, mild in  severity mid anterior, apical anterior, mid anteroseptal, apical septal  and apical partially reversible defect suggestive of probable ischemia or  possible attenuation.  Review of raw data suggest motion artifact.  TID  estimate  of 0.86.  Summed stress score of 13, Summed rest score of 2, and  Summed difference score of 11.  Gated imaging revealed no wall motion with  an estimated ejection fraction of greater than 65%.  Right ventricle is  noted to be normal in size and function.    Assessment / Plan: 1. Chest pain -  Most likely angina - has numerous CV risk factors and prior abnormal Myoview. Stopping Inderal. Start Lopressor 25 mg BID. Start Imdur 30 mg a day. Use NTG prn and to go to the ER if she has used 3 NTG in 15 minutes. Will arrange for cardiac catheterization - arranged with Dr. Eldridge Dace on Monday at 9am. CXR and labs today. The procedure, risks and benefits have been reviewed and she is willing to proceed.   2. Prior abnormal Myoview -  Now with symptoms - will proceed on with cardiac cath  3. HTN -  BP ok on current regimen.  4. DM  5. HLD - check lipids today.   Further disposition to follow.   Patient is agreeable to this plan and will call if any problems develop in the interim.   Rosalio Macadamia, RN, ANP-C [facility] [address] [place], [place]  [address] [phone]  Addendum:  Patient was  sent for CXR today as part of her pre op - result is as follows:  COMPARISON: PA and lateral chest x-ray dated June 17, 2010  FINDINGS: There is new abnormally increased density inferior and lateral to the left hilum. It is difficult to visualize on the lateral film. It measures 2.7 x 3.4 cm The lung markings are coarse bilaterally in the lower lung zones and there is scarring in the lateral costophrenic gutters. There is no significant pleural effusion. The heart and pulmonary vascularity exhibit no acute abnormalities. Central prominence of the right pulmonary artery is stable. The trachea is midline. The bony thorax exhibits no acute abnormality.  IMPRESSION: 1. There is new increased density in the mid to lower left lung. This may lie posteriorly in the lower lobe images difficult to visualize on the lateral film. This may reflect local pneumonia or a parenchymal mass. Chest CT scanning is recommended. 2. There is no evidence of CHF.  These results will be called to the ordering clinician or representative by the Radiologist Assistant, and communication documented in the PACS or zVision Dashboard.   Electronically Signed  By: David Swaziland  On: 10/17/2014 10:37  This has been reviewed with Dr. Ladona Ridgel (DOD). Will try to proceed with CT of the chest to further define - have attempted 4 x to contact the patient and have left instructions for her to call us back so we can try to complete prior to the cardiac cath. Our office is open Friday and we have availability to proceed with CT on Friday.   Rosalio Macadamia, RN, ANP-C [facility] [address] [place], [place]  [address] [phone]

## 2014-10-17 NOTE — Telephone Encounter (Signed)
Patient's daughter Steward Drone called no answer.Left message on personal answering machine to call me.Mother needs to have a chest ct this Friday 10/19/14.Call me for time.

## 2014-10-17 NOTE — Patient Instructions (Addendum)
We will be checking the following labs today BMET, CBC, PT, PTT, HPF, lipids, A1C  Stay on your current medicines but stop Inderal  Start Lopressor 25 mg twice a day  Start Imdur 30 mg a day  Use your NTG under your tongue for recurrent chest pain. May take one tablet every 5 minutes. If you are still having discomfort after 3 tablets in 15 minutes, call 911.  Please go to [facility] to [facility] on the first floor for a chest Xray - you may walk in.   Your provider has recommended a cardiac catherization  You are scheduled for a cardiac catheterization on Monday, November 30th at [facility] with Dr. Eldridge Dace or associate.  Go to [facility] 2nd Floor Short Stay on Monday, November 30th at [facility] thru the [employer] entrance A No food or drink after midnight on Sunday You may take your medications with a sip of water on the day of your procedure but do not take your Glipizide  1/2 dose insulin on Sunday night  Coronary Angiogram A coronary angiogram, also called coronary angiography, is an X-ray procedure used to look at the arteries in the heart. In this procedure, a dye (contrast dye) is injected through a long, hollow tube (catheter). The catheter is about the size of a piece of cooked spaghetti and is inserted through your groin, wrist, or arm. The dye is injected into each artery, and X-rays are then taken to show if there is a blockage in the arteries of your heart.  LET [facility] CARE PROVIDER KNOW ABOUT:  Any allergies you have, including allergies to shellfish or contrast dye.   All medicines you are taking, including vitamins, herbs, eye drops, creams, and over-the-counter medicines.   Previous problems you or members of your family have had with the use of anesthetics.   Any blood disorders you have.   Previous surgeries you have had.  History of kidney problems or failure.   Other medical conditions you have.  RISKS AND COMPLICATIONS   Generally, a coronary angiogram is a safe procedure. However, about 1 person out of 1000 can have problems that may include:  Allergic reaction to the dye.  Bleeding/bruising from the access site or other locations.  Kidney injury, especially in people with impaired kidney function.  Stroke (rare).  Heart attack (rare).  Irregular rhythms (rare)  Death (rare)  BEFORE THE PROCEDURE   Do not eat or drink anything after midnight the night before the procedure or as directed by your health care provider.   Ask your health care provider about changing or stopping your regular medicines. This is especially important if you are taking diabetes medicines or blood thinners.  PROCEDURE  You may be given a medicine to help you relax (sedative) before the procedure. This medicine is given through an intravenous (IV) access tube that is inserted into one of your veins.   The area where the catheter will be inserted will be washed and shaved. This is usually done in the groin but may be done in the fold of your arm (near your elbow) or in the wrist.   A medicine will be given to numb the area where the catheter will be inserted (local anesthetic).   The health care provider will insert the catheter into an artery. The catheter will be guided by using a special type of X-ray (fluoroscopy) of the blood vessel being examined.   A special dye will then be injected into  the catheter, and X-rays will be taken. The dye will help to show where any narrowing or blockages are located in the heart arteries.    AFTER THE PROCEDURE   If the procedure is done through the leg, you will be kept in bed lying flat for several hours. You will be instructed to not bend or cross your legs.  The insertion site will be checked frequently.   The pulse in your feet or wrist will be checked frequently.   Additional blood tests, X-rays, and an electrocardiogram may be done.    Call the [facility] office at [phone] if you have any questions, problems or concerns.

## 2014-10-19 ENCOUNTER — Ambulatory Visit (INDEPENDENT_AMBULATORY_CARE_PROVIDER_SITE_OTHER)
Admission: RE | Admit: 2014-10-19 | Discharge: 2014-10-19 | Disposition: A | Discharge status: Home / Self Care | Payer: Medicare Other | Source: Ambulatory Visit | Attending: Nurse Practitioner | Admitting: Nurse Practitioner

## 2014-10-19 ENCOUNTER — Telehealth: Payer: Self-pay

## 2014-10-19 ENCOUNTER — Telehealth: Payer: Self-pay | Admitting: Nurse Practitioner

## 2014-10-19 DIAGNOSIS — R938 Abnormal findings on diagnostic imaging of other specified body structures: Secondary | ICD-10-CM

## 2014-10-19 MED ORDER — IOHEXOL 300 MG/ML  SOLN
80.0000 mL | Freq: Once | INTRAMUSCULAR | Status: AC | PRN
  Administered 2014-10-19: 80 mL via INTRAVENOUS

## 2014-10-19 NOTE — Telephone Encounter (Signed)
Spoke to patient's daughter Steward Drone someone already called her .Stated she has Ct chest scheduled this morning at 10:00 am.

## 2014-10-22 ENCOUNTER — Encounter (HOSPITAL_COMMUNITY): Admission: RE | Payer: Self-pay | Source: Ambulatory Visit

## 2014-10-22 ENCOUNTER — Telehealth: Payer: Self-pay | Admitting: *Deleted

## 2014-10-22 ENCOUNTER — Ambulatory Visit (HOSPITAL_COMMUNITY)
Admission: RE | Admit: 2014-10-22 | Payer: Medicare Other | Source: Ambulatory Visit | Admitting: Interventional Cardiology

## 2014-10-22 SURGERY — LEFT HEART CATHETERIZATION WITH CORONARY ANGIOGRAM
Anesthesia: LOCAL

## 2014-10-22 NOTE — Telephone Encounter (Signed)
I would like to keep this visit to recheck her.  Is she taking the Imdur and Lopressor as prescribed - would continue this as well.

## 2014-10-22 NOTE — Telephone Encounter (Signed)
S/w pt's daughter is taking medication currently prescribed at o/v.  Is agreeable to plan will come in on December 18 for f/u ov.

## 2014-10-22 NOTE — Telephone Encounter (Signed)
S/w pt's daughter brought pt to [facility] ER and was treated for pneumonia given antibiotic. Pt is feeling better,  pain in chest subsided,  has a little cough.  Daughter stated since pt is feeling better does pt need to come back in for scheduled f/u appointment to discuss cath.  Will route to Norma Fredrickson, NP, to advise.

## 2014-11-09 ENCOUNTER — Ambulatory Visit: Payer: Medicare Other | Admitting: Nurse Practitioner

## 2014-11-14 ENCOUNTER — Ambulatory Visit: Payer: Medicare Other | Admitting: Nurse Practitioner

## 2014-12-13 ENCOUNTER — Encounter: Payer: Self-pay | Admitting: Cardiology

## 2014-12-13 ENCOUNTER — Telehealth: Payer: Self-pay | Admitting: Cardiology

## 2014-12-13 ENCOUNTER — Ambulatory Visit (INDEPENDENT_AMBULATORY_CARE_PROVIDER_SITE_OTHER): Payer: Medicare Other | Admitting: Cardiology

## 2014-12-13 VITALS — BP 114/72 | HR 87 | Ht 63.0 in | Wt 191.0 lb

## 2014-12-13 DIAGNOSIS — E785 Hyperlipidemia, unspecified: Secondary | ICD-10-CM

## 2014-12-13 DIAGNOSIS — R9439 Abnormal result of other cardiovascular function study: Secondary | ICD-10-CM

## 2014-12-13 DIAGNOSIS — I10 Essential (primary) hypertension: Secondary | ICD-10-CM

## 2014-12-13 NOTE — Progress Notes (Signed)
[name] Date of Birth: 13-Dec-1933 Medical Record #[id number]  History of Present Illness: Kristen Weber is a 81 y.o. female with diabetes, hypertension, hyperlipidemia, OA and tremor who underwent nuclear stress test noted in June of 2014 at [facility] interpreted as probably abnormal myocardial perfusion study with a large, mildly severe mid anterior, mid anteroseptal, apical anterior, apical septal and apical nearly completely reversible defect suggestive of probable ischemia or possible attenuation artifact. There is moderate motion artifact, sensitivity, specificity of the study is reduced by the noted artifact. No TID. She has been managed medically. She previously had no symptoms reported.  I saw her a little over one year ago. I favored continued medical management but noted that we would pursue cardiac cath if she became symptomatic.   She ended up seeing Norma Fredrickson with her daughter complaining of heaviness over her left breast typically occurs in the mornings but she can have later in the day. She has associated shortness of breath. No radiation of the discomfort. She has more fatigue. Sometimes will last a few minutes - other times up to an hour or so. She has not used NTG - did not think to use that. Symptoms occur at rest. She does not exercise. Limited by arthritis. BP ok. Says her sugars are ok but not able to tell me her A1C and does not check her sugars. She was planning on having cataract surgery.   During that discussion, cardiac catheterization was set up however she had a chest x-ray which showed a left lower lobe mass which was then evaluated with CT scan of chest which showed possible developing left lower lobe pneumonia. She was referred to her primary physician for further treatment.  She is here today to discuss once again cardiac catheterization.  She states that she is no longer having any chest discomfort and would like to hold off on catheterization.  Current  Outpatient Prescriptions  Medication Sig Dispense Refill  . ACCU-CHEK AVIVA PLUS test strip     . aspirin 81 MG tablet Take 81 mg by mouth.    . DUREZOL 0.05 % EMUL   2  . GLIPIZIDE XL 10 MG 24 hr tablet Take 10 mg by mouth daily.     Marland Kitchen glucose blood test strip     . hydrochlorothiazide (HYDRODIURIL) 25 MG tablet Take 25 mg by mouth daily.     . ILEVRO 0.3 % SUSP   4  . isosorbide mononitrate (IMDUR) 30 MG 24 hr tablet Take 1 tablet (30 mg total) by mouth daily. 30 tablet 3  . LANTUS SOLOSTAR 100 UNIT/ML SOPN 30 Units daily.     Marland Kitchen losartan (COZAAR) 50 MG tablet Take 50 mg by mouth daily.     . metoprolol tartrate (LOPRESSOR) 25 MG tablet Take 1 tablet (25 mg total) by mouth 2 (two) times daily. (Patient taking differently: Take 25 mg by mouth daily. ) 60 tablet 3  . nitroGLYCERIN (NITROSTAT) 0.4 MG SL tablet Place 1 tablet (0.4 mg total) under the tongue every 5 (five) minutes as needed. 25 tablet 6  . omeprazole (PRILOSEC) 20 MG capsule Take 20 mg by mouth.    . senna-docusate (SENOKOT-S) 8.6-50 MG per tablet Take by mouth.    . simvastatin (ZOCOR) 40 MG tablet Take 40 mg by mouth daily.  5  . traMADol (ULTRAM) 50 MG tablet Take 50 mg by mouth as directed.      No current facility-administered medications for this visit.    Allergies  Allergen Reactions  . Lisinopril Cough    cough    Past Medical History  Diagnosis Date  . H/O: hysterectomy   . Diabetes mellitus     Type 2  . Abnormal cardiovascular stress test 09/20/2013    Possibly abnormal nuclear stress at [place]-anteroseptal abnormality interpreted as possible breast attenuation as well, motion artifact. Normal EF. No TID. Watching medically  . Diabetes 09/20/2013  . Essential hypertension 09/20/2013  . Hyperlipidemia 09/20/2013    Past Surgical History  Procedure Laterality Date  . Total abdominal hysterectomy      History  Smoking status  . Never Smoker   Smokeless tobacco  . Never Used    History    Alcohol Use No    Family History  Problem Relation Age of Onset  . Diabetes Mother     Review of Systems: The review of systems is per the HPI.  All other systems were reviewed and are negative.  Physical Exam: BP 114/72 mmHg  Pulse 87  Ht 5\' 3"  (1.6 m)  Wt 191 lb (86.637 kg)  BMI 33.84 kg/m2 Patient is very pleasant and in no acute distress. Skin is warm and dry. Color is normal.  HEENT is unremarkable. Normocephalic/atraumatic. PERRL. Sclera are nonicteric. Neck is supple. No masses. No JVD. Lungs are clear. Cardiac exam shows a regular rate and rhythm -  Occasional ectopic.  Abdomen is obese but soft. Extremities are without edema. Gait and ROM are intact. Using a cane. No gross neurologic deficits noted.  Wt Readings from Last 3 Encounters:  12/13/14 191 lb (86.637 kg)  10/17/14 190 lb 1.9 oz (86.238 kg)  03/05/14 194 lb (87.998 kg)    LABORATORY DATA/PROCEDURES: EKG  with NSR - PVC  Lab Results  Component Value Date   WBC 3.6* 10/17/2014   HGB 12.9 10/17/2014   HCT 39.7 10/17/2014   PLT 299.0 10/17/2014   GLUCOSE 105* 10/17/2014   CHOL 184 10/17/2014   TRIG 311.0* 10/17/2014   HDL 44.80 10/17/2014   LDLDIRECT 64.8 10/17/2014   ALT 12 10/17/2014   AST 19 10/17/2014   NA 135 10/17/2014   K 3.9 10/17/2014   CL 98 10/17/2014   CREATININE 1.1 10/17/2014   BUN 16 10/17/2014   CO2 28 10/17/2014   INR 1.2* 10/17/2014   HGBA1C 7.9* 10/17/2014   Nuclear Cardiology Report From Care Everywhere Dated June 2014 Impression: 1.  Probably Abnormal myocardial perfusion study. 2.  There is a large, mildly severe mid anterior, mid anteroseptal, apical  anterior, apical septal and apical nearly completely reversible defect  suggestive of probable ischemia or possible attenuation artifact. 3.  There is moderate motion artifact, sensitivity specificity of the  study is reduced by the noted artifact.  Ordering Provider:  July 2014 Indication:     81 y.o. female presents  with complaint of chest pain. Cardiac Risk Factors: Diabetic, hypertension, hyperlipidemia  Procedure:  SPECT myocardial perfusion with rest and stress imaging    EKG findings: Baseline EKG Findings: Normal sinus rhythm with occasional PVC Baseline Heart Rate:  88 Baseline BP   110/78 Target Heart Rate:  118 Peak Heart Rate:  136 Peak Blood Pressure:  182/90 Exercise Time:  5 minutes and 0 seconds Workload:   7.0 METs Chest Pain:   none Arrhythmias:   PVCs EKG Findings:   Patient had bigeminy the start of exercise which resolved  after 1 minute.  Nuclear images findings: Rest and stress images were reviewed.  There was a  large size, mild in  severity mid anterior, apical anterior, mid anteroseptal, apical septal  and apical partially reversible defect suggestive of probable ischemia or  possible attenuation.  Review of raw data suggest motion artifact.  TID  estimate of 0.86.  Summed stress score of 13, Summed rest score of 2, and  Summed difference score of 11.  Gated imaging revealed no wall motion with  an estimated ejection fraction of greater than 65%.  Right ventricle is  noted to be normal in size and function.    Assessment / Plan:  1. Chest pain -   has numerous CV risk factors and prior abnormal Myoview.  Lopressor 25 mg BID. Started Imdur 30 mg a day. Use NTG prn and to go to the ER if she has used 3 NTG in 15 minutes. Her previous catheterization was canceled secondary to left lower lobe pneumonia. She does not wish to proceed with catheterization at this time. I will have low threshold for ordering catheterization. I described to her risks and benefits.  2. Prior abnormal Myoview -  Now with symptoms -offered catheterization but she did not wish to proceed. She feels better now that pneumonia is cleared.  3. HTN -  BP ok on current regimen.  4. DM  5. HLD -  LDL 64  Patient is agreeable to this plan and will call if any problems develop in the interim.  We  will see her back in 6 months.  Donato Schultz, MD [facility] [address] [place], [place]  [address] [phone]

## 2014-12-13 NOTE — Patient Instructions (Signed)
The current medical regimen is effective;  continue present plan and medications.  Follow up in 6 months with Dr. Skains.  You will receive a letter in the mail 2 months before you are due.  Please call us when you receive this letter to schedule your follow up appointment.  Thank you for choosing [facility]!!     

## 2014-12-13 NOTE — Telephone Encounter (Signed)
New Msg         Pt daughter calling, pt was medication for tremors a few months ago.   Pt daughter wants to know if medication that Norma Fredrickson prescribed helps with tremors as well?   Please return call.

## 2014-12-13 NOTE — Telephone Encounter (Signed)
Spoke with daughter who wanted to know the name of the medication that Lawson Fiscal stopped at the office visit in 09/2014.  Advised Inderal was stopped at that time.

## 2014-12-14 ENCOUNTER — Ambulatory Visit: Payer: Self-pay | Admitting: Cardiology

## 2015-02-11 ENCOUNTER — Ambulatory Visit (INDEPENDENT_AMBULATORY_CARE_PROVIDER_SITE_OTHER): Payer: Medicare Other | Admitting: Nurse Practitioner

## 2015-02-11 ENCOUNTER — Other Ambulatory Visit: Payer: Self-pay | Admitting: Nurse Practitioner

## 2015-02-11 ENCOUNTER — Observation Stay (HOSPITAL_COMMUNITY)
Admission: AD | Admit: 2015-02-11 | Discharge: 2015-02-12 | Disposition: A | Discharge status: Home / Self Care | Payer: Medicare Other | Source: Ambulatory Visit | Attending: Cardiology | Admitting: Cardiology

## 2015-02-11 ENCOUNTER — Ambulatory Visit (HOSPITAL_COMMUNITY): Admit: 2015-02-11 | Payer: Self-pay | Admitting: Cardiology

## 2015-02-11 ENCOUNTER — Encounter: Payer: Self-pay | Admitting: Nurse Practitioner

## 2015-02-11 ENCOUNTER — Encounter (HOSPITAL_COMMUNITY): Payer: Self-pay

## 2015-02-11 VITALS — BP 142/90 | HR 80 | Ht 64.0 in | Wt 194.8 lb

## 2015-02-11 DIAGNOSIS — R9439 Abnormal result of other cardiovascular function study: Secondary | ICD-10-CM | POA: Diagnosis not present

## 2015-02-11 DIAGNOSIS — M1990 Unspecified osteoarthritis, unspecified site: Secondary | ICD-10-CM | POA: Insufficient documentation

## 2015-02-11 DIAGNOSIS — G4733 Obstructive sleep apnea (adult) (pediatric): Secondary | ICD-10-CM | POA: Diagnosis not present

## 2015-02-11 DIAGNOSIS — E119 Type 2 diabetes mellitus without complications: Secondary | ICD-10-CM | POA: Insufficient documentation

## 2015-02-11 DIAGNOSIS — Z79899 Other long term (current) drug therapy: Secondary | ICD-10-CM | POA: Insufficient documentation

## 2015-02-11 DIAGNOSIS — R079 Chest pain, unspecified: Secondary | ICD-10-CM

## 2015-02-11 DIAGNOSIS — I10 Essential (primary) hypertension: Secondary | ICD-10-CM | POA: Diagnosis not present

## 2015-02-11 DIAGNOSIS — I25110 Atherosclerotic heart disease of native coronary artery with unstable angina pectoris: Secondary | ICD-10-CM | POA: Diagnosis not present

## 2015-02-11 DIAGNOSIS — I200 Unstable angina: Secondary | ICD-10-CM

## 2015-02-11 DIAGNOSIS — R251 Tremor, unspecified: Secondary | ICD-10-CM | POA: Insufficient documentation

## 2015-02-11 DIAGNOSIS — Z794 Long term (current) use of insulin: Secondary | ICD-10-CM | POA: Diagnosis not present

## 2015-02-11 DIAGNOSIS — Z9079 Acquired absence of other genital organ(s): Secondary | ICD-10-CM | POA: Insufficient documentation

## 2015-02-11 DIAGNOSIS — E785 Hyperlipidemia, unspecified: Secondary | ICD-10-CM | POA: Insufficient documentation

## 2015-02-11 HISTORY — DX: Rheumatoid arthritis, unspecified: M06.9

## 2015-02-11 HISTORY — DX: Sleep apnea, unspecified: G47.30

## 2015-02-11 LAB — COMPREHENSIVE METABOLIC PANEL
ALT: 18 U/L (ref 0–35)
AST: 23 U/L (ref 0–37)
Albumin: 3.7 g/dL (ref 3.5–5.2)
Alkaline Phosphatase: 68 U/L (ref 39–117)
Anion gap: 6 (ref 5–15)
BUN: 18 mg/dL (ref 6–23)
CO2: 31 mmol/L (ref 19–32)
Calcium: 10.3 mg/dL (ref 8.4–10.5)
Chloride: 100 mmol/L (ref 96–112)
Creatinine, Ser: 0.94 mg/dL (ref 0.50–1.10)
GFR calc Af Amer: 66 mL/min — ABNORMAL LOW (ref >90)
GFR calc non Af Amer: 57 mL/min — ABNORMAL LOW (ref >90)
Glucose, Bld: 102 mg/dL — ABNORMAL HIGH (ref 70–99)
Potassium: 3.9 mmol/L (ref 3.5–5.1)
Sodium: 137 mmol/L (ref 135–145)
Total Bilirubin: 0.2 mg/dL — ABNORMAL LOW (ref 0.3–1.2)
Total Protein: 6.2 g/dL (ref 6.0–8.3)

## 2015-02-11 LAB — GLUCOSE, CAPILLARY: Glucose-Capillary: 197 mg/dL — ABNORMAL HIGH (ref 70–99)

## 2015-02-11 LAB — CBC WITH DIFFERENTIAL/PLATELET
Basophils Absolute: 0 10*3/uL (ref 0.0–0.1)
Basophils Relative: 1 % (ref 0–1)
Eosinophils Absolute: 0.2 10*3/uL (ref 0.0–0.7)
Eosinophils Relative: 4 % (ref 0–5)
HCT: 41.1 % (ref 36.0–46.0)
Hemoglobin: 13.4 g/dL (ref 12.0–15.0)
Lymphocytes Relative: 41 % (ref 12–46)
Lymphs Abs: 1.4 10*3/uL (ref 0.7–4.0)
MCH: 29.7 pg (ref 26.0–34.0)
MCHC: 32.6 g/dL (ref 30.0–36.0)
MCV: 91.1 fL (ref 78.0–100.0)
Monocytes Absolute: 0.3 10*3/uL (ref 0.1–1.0)
Monocytes Relative: 8 % (ref 3–12)
Neutro Abs: 1.6 10*3/uL — ABNORMAL LOW (ref 1.7–7.7)
Neutrophils Relative %: 46 % (ref 43–77)
Platelets: 180 10*3/uL (ref 150–400)
RBC: 4.51 MIL/uL (ref 3.87–5.11)
RDW: 14.2 % (ref 11.5–15.5)
WBC: 3.5 10*3/uL — ABNORMAL LOW (ref 4.0–10.5)

## 2015-02-11 LAB — TSH: TSH: 1.27 u[IU]/mL (ref 0.350–4.500)

## 2015-02-11 LAB — TROPONIN I
Troponin I: <0.03 ng/mL (ref <0.031)
Troponin I: <0.03 ng/mL (ref <0.031)

## 2015-02-11 LAB — MAGNESIUM: Magnesium: 2.1 mg/dL (ref 1.5–2.5)

## 2015-02-11 LAB — APTT: aPTT: 36 seconds (ref 24–37)

## 2015-02-11 LAB — PROTIME-INR
INR: 1.11 (ref 0.00–1.49)
Prothrombin Time: 14.5 seconds (ref 11.6–15.2)

## 2015-02-11 LAB — BRAIN NATRIURETIC PEPTIDE: B Natriuretic Peptide: 57.1 pg/mL (ref 0.0–100.0)

## 2015-02-11 SURGERY — LEFT HEART CATHETERIZATION WITH CORONARY ANGIOGRAM

## 2015-02-11 MED ORDER — HEPARIN (PORCINE) IN NACL 100-0.45 UNIT/ML-% IJ SOLN
900.0000 [IU]/h | INTRAMUSCULAR | Status: DC
  Administered 2015-02-11 (×2): 900 [IU]/h via INTRAVENOUS
  Filled 2015-02-11 (×2): qty 250

## 2015-02-11 MED ORDER — SODIUM CHLORIDE 0.9 % IV SOLN: 250.0000 mL | INTRAVENOUS | Status: DC | PRN

## 2015-02-11 MED ORDER — ASPIRIN 81 MG PO CHEW
324.0000 mg | CHEWABLE_TABLET | ORAL | Status: AC
  Administered 2015-02-11: 324 mg via ORAL
  Filled 2015-02-11: qty 4

## 2015-02-11 MED ORDER — ACETAMINOPHEN 325 MG PO TABS: 650.0000 mg | ORAL_TABLET | ORAL | Status: DC | PRN

## 2015-02-11 MED ORDER — SODIUM CHLORIDE 0.9 % IJ SOLN: 3.0000 mL | INTRAMUSCULAR | Status: DC | PRN

## 2015-02-11 MED ORDER — LOSARTAN POTASSIUM 50 MG PO TABS
50.0000 mg | ORAL_TABLET | Freq: Every day | ORAL | Status: DC
  Administered 2015-02-11 – 2015-02-12 (×2): 50 mg via ORAL
  Filled 2015-02-11 (×2): qty 1

## 2015-02-11 MED ORDER — SIMVASTATIN 40 MG PO TABS
40.0000 mg | ORAL_TABLET | Freq: Every day | ORAL | Status: DC
  Administered 2015-02-11: 40 mg via ORAL
  Filled 2015-02-11 (×2): qty 1

## 2015-02-11 MED ORDER — ASPIRIN EC 81 MG PO TBEC
81.0000 mg | DELAYED_RELEASE_TABLET | Freq: Every day | ORAL | Status: DC
  Filled 2015-02-11: qty 1

## 2015-02-11 MED ORDER — HYDROCHLOROTHIAZIDE 25 MG PO TABS
25.0000 mg | ORAL_TABLET | Freq: Every day | ORAL | Status: DC
  Filled 2015-02-11 (×2): qty 1

## 2015-02-11 MED ORDER — HEPARIN BOLUS VIA INFUSION
4000.0000 [IU] | Freq: Once | INTRAVENOUS | Status: AC
  Administered 2015-02-11: 4000 [IU] via INTRAVENOUS
  Filled 2015-02-11: qty 4000

## 2015-02-11 MED ORDER — GLIPIZIDE 10 MG PO TABS
10.0000 mg | ORAL_TABLET | Freq: Every day | ORAL | Status: DC
  Filled 2015-02-11 (×2): qty 1

## 2015-02-11 MED ORDER — NITROGLYCERIN 0.4 MG SL SUBL: 0.4000 mg | SUBLINGUAL_TABLET | SUBLINGUAL | Status: DC | PRN

## 2015-02-11 MED ORDER — ONDANSETRON HCL 4 MG/2ML IJ SOLN: 4.0000 mg | Freq: Four times a day (QID) | INTRAMUSCULAR | Status: DC | PRN

## 2015-02-11 MED ORDER — TRAMADOL HCL 50 MG PO TABS
50.0000 mg | ORAL_TABLET | Freq: Two times a day (BID) | ORAL | Status: DC | PRN
Start: 2015-02-11 — End: 2015-02-12

## 2015-02-11 MED ORDER — SODIUM CHLORIDE 0.9 % IJ SOLN: 3.0000 mL | Freq: Two times a day (BID) | INTRAMUSCULAR | Status: DC

## 2015-02-11 MED ORDER — ASPIRIN 300 MG RE SUPP
300.0000 mg | RECTAL | Status: AC
  Filled 2015-02-11: qty 1

## 2015-02-11 MED ORDER — PANTOPRAZOLE SODIUM 40 MG PO TBEC
40.0000 mg | DELAYED_RELEASE_TABLET | Freq: Every day | ORAL | Status: DC
  Administered 2015-02-11 – 2015-02-12 (×2): 40 mg via ORAL
  Filled 2015-02-11: qty 1

## 2015-02-11 MED ORDER — SODIUM CHLORIDE 0.9 % IV SOLN
INTRAVENOUS | Status: DC
  Administered 2015-02-11: 21:00:00 via INTRAVENOUS

## 2015-02-11 MED ORDER — ISOSORBIDE MONONITRATE ER 30 MG PO TB24
30.0000 mg | ORAL_TABLET | Freq: Every day | ORAL | Status: DC
  Administered 2015-02-11: 30 mg via ORAL
  Filled 2015-02-11 (×2): qty 1

## 2015-02-11 MED ORDER — NITROGLYCERIN 2 % TD OINT
1.0000 [in_us] | TOPICAL_OINTMENT | Freq: Three times a day (TID) | TRANSDERMAL | Status: DC
  Administered 2015-02-11 – 2015-02-12 (×2): 1 [in_us] via TOPICAL
  Filled 2015-02-11: qty 30

## 2015-02-11 MED ORDER — INSULIN GLARGINE 100 UNIT/ML ~~LOC~~ SOLN
30.0000 [IU] | Freq: Every day | SUBCUTANEOUS | Status: DC
  Administered 2015-02-11: 30 [IU] via SUBCUTANEOUS
  Filled 2015-02-11 (×2): qty 0.3

## 2015-02-11 MED ORDER — METOPROLOL TARTRATE 25 MG PO TABS
25.0000 mg | ORAL_TABLET | Freq: Two times a day (BID) | ORAL | Status: DC
  Filled 2015-02-11 (×3): qty 1

## 2015-02-11 MED ORDER — SODIUM CHLORIDE 0.9 % IJ SOLN
3.0000 mL | Freq: Two times a day (BID) | INTRAMUSCULAR | Status: DC
  Administered 2015-02-11: 3 mL via INTRAVENOUS

## 2015-02-11 NOTE — Progress Notes (Signed)
CARDIOLOGY OFFICE NOTE  Date:  02/11/2015    Kristen Weber Date of Birth: 1934-06-05 Medical Record #[id number]  PCP:  Jonetta Osgood, NP  Cardiologist:  [facility]    Chief Complaint  Patient presents with  . Chest Pain    Work in visit/post ER - seen for Dr. Anne Fu     History of Present Illness: Kristen Weber is a 81 y.o. female who presents today for a work in visit/post ER - seen for Dr. Anne Fu.   She has a history of diabetes, hypertension, hyperlipidemia, OA and tremor who underwent nuclear stress test noted in June of 2014 at [facility] interpreted as probably abnormal myocardial perfusion study with a large, mildly severe mid anterior, mid anteroseptal, apical anterior, apical septal and apical nearly completely reversible defect suggestive of probable ischemia or possible attenuation artifact. There is moderate motion artifact, sensitivity, specificity of the study is reduced by the noted artifact. No TID. She has been managed medically. She previously had no symptoms reported.  Saw Dr. Anne Fu over one year ago. He favored continued medical management but noted that he would pursue cardiac cath if she became symptomatic.  Last seen by me in November of 2015 - she was having chest pain - I arranged for a cardiac cath but this was cancelled due to pre op CXR showing pneumonia. Her pneumonia was treated - her chest pain was resolved and the patient and family opted for continued observation with medical management.   Saw Dr. Anne Fu this past January - was doing well and again, wanted to hold off on cardiac catheterization.  Records from Care Everywhere reviewed - was at [facility] over the weekend - Was seen for unstable angina over the weekend - had had several days of chest pain - intermittent - associated with shortness of breath as well. Worse with activities.  HR noted to be 100 by EKG. She agreed to proceeding on with cardiac cath - Had arrangements to be transferred for inpatient  cath - patient then refused - wanted to be seen back in the office first. Thus added to my schedule in the [facility] clinic for today.  Comes back today. Here with family. They are all very upset - apparently they were told to be here this morning at 10 am to see Dr. Anne Fu and had a cardiac cath arranged for 4pm today with Dr. [place] (which has been cancelled) - They waited for a while - went to lunch and then came back - basically here since 10 am today. I was not made aware of this. She is willing to proceed on with cardiac cath. Chest pain continues. Had it yesterday. Tells me that this started last Wednesday - feels like a heaviness across her chest - has happened every day - went to the ER on Saturday - did not want to be transferred due to having to care for her husband. Had more discomfort yesterday with vacuuming. Her spells will last 30 minutes to an hour or more. Feels short of breath. She used NTG yesterday with relief. Has not had any today. Lives [place] fair distance away.   Past Medical History  Diagnosis Date  . H/O: hysterectomy   . Diabetes mellitus     Type 2  . Abnormal cardiovascular stress test 09/20/2013    Possibly abnormal nuclear stress at [place]-anteroseptal abnormality interpreted as possible breast attenuation as well, motion artifact. Normal EF. No TID. Watching medically  . Diabetes 09/20/2013  . Essential hypertension  09/20/2013  . Hyperlipidemia 09/20/2013    Past Surgical History  Procedure Laterality Date  . Total abdominal hysterectomy       Medications: Current Outpatient Prescriptions  Medication Sig Dispense Refill  . ACCU-CHEK AVIVA PLUS test strip     . aspirin 81 MG tablet Take 81 mg by mouth.    Marland Kitchen GLIPIZIDE XL 10 MG 24 hr tablet Take 10 mg by mouth daily.     Marland Kitchen glucose blood test strip     . hydrochlorothiazide (HYDRODIURIL) 25 MG tablet Take 25 mg by mouth daily.     . isosorbide mononitrate (IMDUR) 30 MG 24 hr tablet Take 1 tablet (30 mg total) by  mouth daily. 30 tablet 3  . LANTUS SOLOSTAR 100 UNIT/ML SOPN 28 Units daily.     Marland Kitchen losartan (COZAAR) 50 MG tablet Take 50 mg by mouth daily.     . nitroGLYCERIN (NITROSTAT) 0.4 MG SL tablet Place 1 tablet (0.4 mg total) under the tongue every 5 (five) minutes as needed. 25 tablet 6  . omeprazole (PRILOSEC) 20 MG capsule Take 20 mg by mouth.    . simvastatin (ZOCOR) 40 MG tablet Take 40 mg by mouth daily.  5  . traMADol (ULTRAM) 50 MG tablet Take 50 mg by mouth as directed.     . DUREZOL 0.05 % EMUL   2  . ILEVRO 0.3 % SUSP   4  . metoprolol tartrate (LOPRESSOR) 25 MG tablet Take 1 tablet (25 mg total) by mouth 2 (two) times daily. (Patient taking differently: Take 25 mg by mouth daily. ) 60 tablet 3   No current facility-administered medications for this visit.    Allergies: Allergies  Allergen Reactions  . Lisinopril Cough    cough    Social History: The patient  reports that she has never smoked. She has never used smokeless tobacco. She reports that she does not drink alcohol or use illicit drugs.   Family History: The patient's family history includes Diabetes in her mother.   Review of Systems: Please see the history of present illness.   Otherwise, the review of systems is positive for recent chills, chest pain, DOE, visual disturbance, cough, back pain, muscle pain, easy bruising, leg pain, skipped heart beats, irregular heart beats, snoring, constipation and balance issues.   All other systems are reviewed and negative.   Physical Exam: VS:  BP 142/90 mmHg  Pulse 80  Ht 5\' 4"  (1.626 m)  Wt 194 lb 12.8 oz (88.361 kg)  BMI 33.42 kg/m2 .  BMI Body mass index is 33.42 kg/(m^2).  Wt Readings from Last 3 Encounters:  02/11/15 194 lb 12.8 oz (88.361 kg)  12/13/14 191 lb (86.637 kg)  10/17/14 190 lb 1.9 oz (86.238 kg)    General: Pleasant. She is obese. She is in no acute distress. She is currently painfree.  HEENT: Normal. Neck: Supple, no JVD, carotid bruits, or masses  noted.  Cardiac: Regular rate and rhythm. No murmurs, rubs, or gallops. No edema.  Respiratory:  Lungs are clear to auscultation bilaterally with normal work of breathing.  GI: Soft and nontender.  MS: No deformity or atrophy. Gait and ROM intact. Skin: Warm and dry. Color is normal.  Neuro:  Strength and sensation are intact and no gross focal deficits noted.  Psych: Alert, appropriate and with normal affect.   LABORATORY DATA:  EKG:  EKG is ordered today. This shows NSR - rate of 80.   Lab Results  Component Value Date  WBC 3.6* 10/17/2014   HGB 12.9 10/17/2014   HCT 39.7 10/17/2014   PLT 299.0 10/17/2014   GLUCOSE 105* 10/17/2014   CHOL 184 10/17/2014   TRIG 311.0* 10/17/2014   HDL 44.80 10/17/2014   LDLDIRECT 64.8 10/17/2014   ALT 12 10/17/2014   AST 19 10/17/2014   NA 135 10/17/2014   K 3.9 10/17/2014   CL 98 10/17/2014   CREATININE 1.1 10/17/2014   BUN 16 10/17/2014   CO2 28 10/17/2014   INR 1.2* 10/17/2014   HGBA1C 7.9* 10/17/2014    BNP (last 3 results) No results for input(s): BNP in the last 8760 hours.  ProBNP (last 3 results) No results for input(s): PROBNP in the last 8760 hours.   Other Studies Reviewed Today:  Xr Chest Pa And Lateral from Care Everywhere  02/09/2015 CLINICAL DATA: Midsternal to left-sided chest pain EXAM: CHEST 2 VIEW COMPARISON: 10/17/2014 FINDINGS: Heart size and vascular pattern normal. Stable uncoiling of the aorta. Mild horizontally oriented linear opacities lateral left lower lobe may represent scarring from prior pneumonia. There is mild bronchitic change. No pleural effusion. IMPRESSION: Mild scarring or atelectasis left lung base. Electronically Signed By: Esperanza Heir M.D. On: 02/09/2015 11:22   EKG from Care Everywhere Normal sinus rhythm with occasional extra complexes. Rate of 100 beats a minute. Q wave in lead 3. QTC 451. No change from prior EKG of October 19, 2014.   Nuclear Cardiology Report From Care Everywhere  Dated June 2014 Impression: 1. Probably Abnormal myocardial perfusion study. 2. There is a large, mildly severe mid anterior, mid anteroseptal, apical  anterior, apical septal and apical nearly completely reversible defect  suggestive of probable ischemia or possible attenuation artifact. 3. There is moderate motion artifact, sensitivity specificity of the  study is reduced by the noted artifact.  Ordering Provider: Leo Grosser Indication: 81 y.o. female presents with complaint of chest pain. Cardiac Risk Factors: Diabetic, hypertension, hyperlipidemia  Procedure: SPECT myocardial perfusion with rest and stress imaging  Technique: The patient was brought to the nuclear medicine area and after  informa consent was obtained an IV was started. The patient then received  10.7 mCi of Tc 99-m sestamibi. Approximately 30 minutes later resting  images were acquired. Patient then underwent stress testing utilizing  Bruce protocol and achieved target heart. At peak stress the patient was  injected with 32.0 mCi of Tc 99-m sestamibi. Approximately one hour  later stress images were obtained.   EKG findings: Baseline EKG Findings: Normal sinus rhythm with occasional PVC Baseline Heart Rate: 88 Baseline BP 110/78 Target Heart Rate: 118 Peak Heart Rate: 136 Peak Blood Pressure: 182/90 Exercise Time: 5 minutes and 0 seconds Workload: 7.0 METs Chest Pain: none Arrhythmias: PVCs EKG Findings: Patient had bigeminy the start of exercise which resolved  after 1 minute.  Nuclear images findings: Rest and stress images were reviewed. There was a large size, mild in  severity mid anterior, apical anterior, mid anteroseptal, apical septal  and apical partially reversible defect suggestive of probable ischemia or  possible attenuation. Review of raw data suggest motion artifact. TID  estimate of 0.86. Summed stress score of 13, Summed rest score of 2, and  Summed  difference score of 11. Gated imaging revealed no wall motion with  an estimated ejection fraction of greater than 65%. Right ventricle is  noted to be normal in size and function.   Assessment/Plan:  1. Chest pain - with known previous abnormal nuclear study - now with recurrent symptoms -  would proceed on with cardiac cath as previously planned - The patient understands that risks include but are not limited to stroke (1 in 1000), death (1 in 1000), kidney failure [usually temporary] (1 in 500), bleeding (1 in 200), allergic reaction [possibly serious] (1 in 200), and agrees to proceed. Due to progression in symptoms - will go ahead and admit to [facility] - seen by Dr. Katrinka Blazing who is in agreement.   2. DM  3. HTN - borderline control  4. HLD - on statin  Current medicines are reviewed with the patient today.  The patient does not have concerns regarding medicines other than what has been noted above.  The following changes have been made:  See above.  Labs/ tests ordered today include:    Orders Placed This Encounter  Procedures  . EKG 12-Lead     Disposition:   Admit to [facility]. Transferred by private vehicle.   Patient is agreeable to this plan and will call if any problems develop in the interim.   Signed: Rosalio Macadamia, RN, ANP-C 02/11/2015 3:44 PM  [facility] [address] [place], [place]  [address] Phone: [phone] Fax: [phone]

## 2015-02-11 NOTE — Patient Instructions (Addendum)
We are going to admit you to the hospital today  We have a heart catheterization arranged for 9 am tomorrow morning with Dr. Tresa Endo

## 2015-02-11 NOTE — H&P (Signed)
Kristen Weber  02/11/2015 3:00 PM  Office Visit  MRN:  [id number]   Description: Female DOB: 1934/08/25  Provider: Rosalio Macadamia, NP  Department: [facility]       Vital Signs  Most recent update: 02/11/2015 3:35 PM by Burnell Blanks    BP Pulse Ht Wt BMI    142/90 mmHg 80 5\' 4"  (1.626 m) 194 lb 12.8 oz (88.361 kg) 33.42 kg/m2    Vitals History     Progress Notes      , NP at 02/11/2015 3:11 PM     Status: Signed       Expand All Collapse All       CARDIOLOGY OFFICE NOTE  Date: 02/11/2015    02/13/2015 Date of Birth: 02-13-1934 Medical Record 09/16/1934  PCP: #[id number], NP Cardiologist: [facility]   Chief Complaint  Patient presents with  . Chest Pain    Work in visit/post ER - seen for Dr. [facility]     History of Present Illness: Kristen Weber is a 81 y.o. female who presents today for a work in visit/post ER - seen for Dr. 70.   She has a history of diabetes, hypertension, hyperlipidemia, OA and tremor who underwent nuclear stress test noted in June of 2014 at [facility] interpreted as probably abnormal myocardial perfusion study with a large, mildly severe mid anterior, mid anteroseptal, apical anterior, apical septal and apical nearly completely reversible defect suggestive of probable ischemia or possible attenuation artifact. There is moderate motion artifact, sensitivity, specificity of the study is reduced by the noted artifact. No TID. She has been managed medically. She previously had no symptoms reported.  Saw [facility] over one year ago. He favored continued medical management but noted that he would pursue cardiac cath if she became symptomatic.  Last seen by me in November of 2015 - she was having chest pain - I arranged for a cardiac cath but this was cancelled due to pre op CXR showing pneumonia. Her pneumonia was treated - her chest pain was resolved and the patient and family opted for continued  observation with medical management.   Saw Dr. 09-14-1992 this past January - was doing well and again, wanted to hold off on cardiac catheterization.  Records from Care Everywhere reviewed - was at [facility] over the weekend - Was seen for unstable angina over the weekend - had had several days of chest pain - intermittent - associated with shortness of breath as well. Worse with activities. HR noted to be 100 by EKG. She agreed to proceeding on with cardiac cath - Had arrangements to be transferred for inpatient cath - patient then refused - wanted to be seen back in the office first. Thus added to my schedule in the [facility] clinic for today.  Comes back today. Here with family. They are all very upset - apparently they were told to be here this morning at 10 am to see Dr. [facility] and had a cardiac cath arranged for 4pm today with Dr. Anne Fu (which has been cancelled) - They waited for a while - went to lunch and then came back - basically here since 10 am today. I was not made aware of this. She is willing to proceed on with cardiac cath. Chest pain continues. Had it yesterday. Tells me that this started last Wednesday - feels like a heaviness across her chest - has happened every day - went to the ER on Saturday - did not  want to be transferred due to having to care for her husband. Had more discomfort yesterday with vacuuming. Her spells will last 30 minutes to an hour or more. Feels short of breath. She used NTG yesterday with relief. Has not had any today. Lives a fair distance away.   Past Medical History  Diagnosis Date  . H/O: hysterectomy   . Diabetes mellitus     Type 2  . Abnormal cardiovascular stress test 09/20/2013    Possibly abnormal nuclear stress at [place]-anteroseptal abnormality interpreted as possible breast attenuation as well, motion artifact. Normal EF. No TID. Watching medically  . Diabetes 09/20/2013  . Essential hypertension 09/20/2013  .  Hyperlipidemia 09/20/2013    Past Surgical History  Procedure Laterality Date  . Total abdominal hysterectomy       Medications: Current Outpatient Prescriptions  Medication Sig Dispense Refill  . ACCU-CHEK AVIVA PLUS test strip     . aspirin 81 MG tablet Take 81 mg by mouth.    Marland Kitchen GLIPIZIDE XL 10 MG 24 hr tablet Take 10 mg by mouth daily.     Marland Kitchen glucose blood test strip     . hydrochlorothiazide (HYDRODIURIL) 25 MG tablet Take 25 mg by mouth daily.     . isosorbide mononitrate (IMDUR) 30 MG 24 hr tablet Take 1 tablet (30 mg total) by mouth daily. 30 tablet 3  . LANTUS SOLOSTAR 100 UNIT/ML SOPN 28 Units daily.     Marland Kitchen losartan (COZAAR) 50 MG tablet Take 50 mg by mouth daily.     . nitroGLYCERIN (NITROSTAT) 0.4 MG SL tablet Place 1 tablet (0.4 mg total) under the tongue every 5 (five) minutes as needed. 25 tablet 6  . omeprazole (PRILOSEC) 20 MG capsule Take 20 mg by mouth.    . simvastatin (ZOCOR) 40 MG tablet Take 40 mg by mouth daily.  5  . traMADol (ULTRAM) 50 MG tablet Take 50 mg by mouth as directed.     . DUREZOL 0.05 % EMUL   2  . ILEVRO 0.3 % SUSP   4  . metoprolol tartrate (LOPRESSOR) 25 MG tablet Take 1 tablet (25 mg total) by mouth 2 (two) times daily. (Patient taking differently: Take 25 mg by mouth daily. ) 60 tablet 3   No current facility-administered medications for this visit.    Allergies: Allergies  Allergen Reactions  . Lisinopril Cough    cough    Social History: The patient  reports that she has never smoked. She has never used smokeless tobacco. She reports that she does not drink alcohol or use illicit drugs.  Family History: The patient's family history includes Diabetes in her mother.   Review of Systems: Please see the history of present illness. Otherwise, the review of systems is positive for recent chills, chest pain, DOE, visual  disturbance, cough, back pain, muscle pain, easy bruising, leg pain, skipped heart beats, irregular heart beats, snoring, constipation and balance issues. All other systems are reviewed and negative.   Physical Exam: VS: BP 142/90 mmHg  Pulse 80  Ht 5\' 4"  (1.626 m)  Wt 194 lb 12.8 oz (88.361 kg)  BMI 33.42 kg/m2 . BMI Body mass index is 33.42 kg/(m^2).  Wt Readings from Last 3 Encounters:  02/11/15 194 lb 12.8 oz (88.361 kg)  12/13/14 191 lb (86.637 kg)  10/17/14 190 lb 1.9 oz (86.238 kg)    General: Pleasant. She is obese. She is in no acute distress. She is currently painfree.  HEENT: Normal.  Neck: Supple, no JVD, carotid bruits, or masses noted.  Cardiac: Regular rate and rhythm. No murmurs, rubs, or gallops. No edema.  Respiratory: Lungs are clear to auscultation bilaterally with normal work of breathing.  GI: Soft and nontender.  MS: No deformity or atrophy. Gait and ROM intact.  Skin: Warm and dry. Color is normal.  Neuro: Strength and sensation are intact and no gross focal deficits noted.  Psych: Alert, appropriate and with normal affect.   LABORATORY DATA:  EKG: EKG is ordered today. This shows NSR - rate of 80.    Recent Labs    Lab Results  Component Value Date   WBC 3.6* 10/17/2014   HGB 12.9 10/17/2014   HCT 39.7 10/17/2014   PLT 299.0 10/17/2014   GLUCOSE 105* 10/17/2014   CHOL 184 10/17/2014   TRIG 311.0* 10/17/2014   HDL 44.80 10/17/2014   LDLDIRECT 64.8 10/17/2014   ALT 12 10/17/2014   AST 19 10/17/2014   NA 135 10/17/2014   K 3.9 10/17/2014   CL 98 10/17/2014   CREATININE 1.1 10/17/2014   BUN 16 10/17/2014   CO2 28 10/17/2014   INR 1.2* 10/17/2014   HGBA1C 7.9* 10/17/2014      BNP (last 3 results)  Recent Labs (within last 365 days)    No results for input(s): BNP in the last 8760 hours.    ProBNP (last 3 results)  Recent Labs (within  last 365 days)    No results for input(s): PROBNP in the last 8760 hours.     Other Studies Reviewed Today:  Xr Chest Pa And Lateral from Care Everywhere  02/09/2015 CLINICAL DATA: Midsternal to left-sided chest pain EXAM: CHEST 2 VIEW COMPARISON: 10/17/2014 FINDINGS: Heart size and vascular pattern normal. Stable uncoiling of the aorta. Mild horizontally oriented linear opacities lateral left lower lobe may represent scarring from prior pneumonia. There is mild bronchitic change. No pleural effusion. IMPRESSION: Mild scarring or atelectasis left lung base. Electronically Signed By: Esperanza Heir M.D. On: 02/09/2015 11:22   EKG from Care Everywhere Normal sinus rhythm with occasional extra complexes. Rate of 100 beats a minute. Q wave in lead 3. QTC 451. No change from prior EKG of October 19, 2014.   Nuclear Cardiology Report From Care Everywhere Dated June 2014 Impression: 1. Probably Abnormal myocardial perfusion study. 2. There is a large, mildly severe mid anterior, mid anteroseptal, apical  anterior, apical septal and apical nearly completely reversible defect  suggestive of probable ischemia or possible attenuation artifact. 3. There is moderate motion artifact, sensitivity specificity of the  study is reduced by the noted artifact.  Ordering Provider: Leo Grosser Indication: 81 y.o. female presents with complaint of chest pain. Cardiac Risk Factors: Diabetic, hypertension, hyperlipidemia  Procedure: SPECT myocardial perfusion with rest and stress imaging  Technique: The patient was brought to the nuclear medicine area and after  informa consent was obtained an IV was started. The patient then received  10.7 mCi of Tc 99-m sestamibi. Approximately 30 minutes later resting  images were acquired. Patient then underwent stress testing utilizing  Bruce protocol and achieved target heart. At peak stress the patient was  injected with 32.0 mCi of Tc 99-m  sestamibi. Approximately one hour  later stress images were obtained.   EKG findings: Baseline EKG Findings: Normal sinus rhythm with occasional PVC Baseline Heart Rate: 88 Baseline BP 110/78 Target Heart Rate: 118 Peak Heart Rate: 136 Peak Blood Pressure: 182/90 Exercise Time: 5 minutes and 0 seconds Workload: 7.0  METs Chest Pain: none Arrhythmias: PVCs EKG Findings: Patient had bigeminy the start of exercise which resolved  after 1 minute.  Nuclear images findings: Rest and stress images were reviewed. There was a large size, mild in  severity mid anterior, apical anterior, mid anteroseptal, apical septal  and apical partially reversible defect suggestive of probable ischemia or  possible attenuation. Review of raw data suggest motion artifact. TID  estimate of 0.86. Summed stress score of 13, Summed rest score of 2, and  Summed difference score of 11. Gated imaging revealed no wall motion with  an estimated ejection fraction of greater than 65%. Right ventricle is  noted to be normal in size and function.   Assessment/Plan:  1. Chest pain - with known previous abnormal nuclear study - now with recurrent symptoms - would proceed on with cardiac cath as previously planned - The patient understands that risks include but are not limited to stroke (1 in 1000), death (1 in 1000), kidney failure [usually temporary] (1 in 500), bleeding (1 in 200), allergic reaction [possibly serious] (1 in 200), and agrees to proceed. Due to progression in symptoms - will go ahead and admit to [facility] - seen by Dr. Katrinka Blazing who is in agreement.   2. DM  3. HTN - borderline control  4. HLD - on statin  Current medicines are reviewed with the patient today. The patient does not have concerns regarding medicines other than what has been noted above.  The following changes have been made: See above.  Labs/ tests ordered today include:   Orders Placed This Encounter    Procedures  . EKG 12-Lead     Disposition: Admit to [facility]. Transferred by private vehicle.   Patient is agreeable to this plan and will call if any problems develop in the interim.   Signed: Rosalio Macadamia, RN, ANP-C 02/11/2015 3:44 PM  [facility] [address] [place], [place] [address] Phone: [phone] Fax: [phone]                   Referring Provider     Jonetta Osgood, NP     Diagnoses     Chest pain, unspecified chest pain type - Primary    ICD-9-CM: 786.50 ICD-10-CM: R07.9    Essential hypertension     ICD-9-CM: 401.9 ICD-10-CM: I10    Abnormal stress test     ICD-9-CM: 794.39 ICD-10-CM: R94.39    Hyperlipidemia     ICD-9-CM: 272.4 ICD-10-CM: E78.5    Type 2 diabetes mellitus without complication     ICD-9-CM: 250.00 ICD-10-CM: E11.9       Reason for Visit     Chest Pain    Work in visit/post ER - seen for Dr. Anne Fu    Reason for Visit History        Discontinued Medications       Reason for Discontinue    senna-docusate (SENOKOT-S) 8.6-50 MG per tablet Patient Preference      Level of Service     PR OFFICE OUTPATIENT VISIT 25 MINUTES [99214]      Follow-up and Disposition     Routing History       All Charges for This Encounter     Code Description Service Date Service Provider Modifiers Qty    93000 PR ELECTROCARDIOGRAM, COMPLETE 02/11/2015 Rosalio Macadamia, NP  1    [phone] PR OFFICE OUTPATIENT VISIT 25 MINUTES 02/11/2015 Rosalio Macadamia, NP  1      AVS Reports     No AVS Snapshots are available for this encounter.     Patient Instructions     We are going to admit you to the hospital today  We have a heart catheterization arranged for 9 am tomorrow morning with Dr. Tresa Endo        Patient Instructions History      Routing History     There are no sent or routed communications associated  with this encounter.     Chart Reviewed By     Jake Bathe, MD on 02/11/2015 5:40 PM     Previous Visit       Provider Department Encounter #    12/13/2014 2:57 PM Norma Fredrickson, NP [facility] [address] Office [id number]

## 2015-02-11 NOTE — Progress Notes (Signed)
Pt walked to unit ambulating self to room. No report was called or notification of patient coming. Pt alert and oriented with daughter at bedside. No signs of respiratory distress. No c/o pain. VSS. Pt hooked up to monitor. Will admit patient and contact MD for orders. Jillyn Hidden, RN

## 2015-02-11 NOTE — Progress Notes (Addendum)
ANTICOAGULATION CONSULT NOTE - Initial Consult  Pharmacy Consult for Heparin Indication: chest pain/ACS  Allergies  Allergen Reactions  . Lisinopril Cough    cough    Patient Measurements: Weight: 199 lb 11.8 oz (90.6 kg)  IBW 55 kg Heparin Dosing Weight: 75 kg  Vital Signs: Temp: 97.6 F (36.4 C) (03/21 1732) Temp Source: Oral (03/21 1732) BP: 136/86 mmHg (03/21 1732) Pulse Rate: 80 (03/21 1732)  Labs: No results for input(s): HGB, HCT, PLT, APTT, LABPROT, INR, HEPARINUNFRC, CREATININE, CKTOTAL, CKMB, TROPONINI in the last 72 hours.  CrCl cannot be calculated (Patient has no serum creatinine result on file.).   Medical History: Past Medical History  Diagnosis Date  . H/O: hysterectomy   . Diabetes mellitus     Type 2  . Abnormal cardiovascular stress test 09/20/2013    Possibly abnormal nuclear stress at [place]-anteroseptal abnormality interpreted as possible breast attenuation as well, motion artifact. Normal EF. No TID. Watching medically  . Diabetes 09/20/2013  . Essential hypertension 09/20/2013  . Hyperlipidemia 09/20/2013    Medications:  See electronic med rec  Assessment: 81 y.o. female presents to o/p cardiology office with chest pain - pt with known previous abnormal nuclear study. Pt with recent admission to [facility] over the weekend for unstable angina. To begin IV heparin and plan for cath tomorrow. Baseline labs pending.  Goal of Therapy:  Heparin level 0.3-0.7 units/ml Monitor platelets by anticoagulation protocol: Yes   Plan:  1. Heparin IV bolus 4000 units 2. Heparin gtt at 900 units/hr 3. Will f/u 8 hr heparin level 4. Daily heparin level and CBC  Christoper Fabian, PharmD, BCPS Clinical pharmacist, pager [phone] 02/11/2015,5:53 PM   Addendum: Baseline CBC ok.  Christoper Fabian, PharmD, BCPS Clinical pharmacist, pager [phone] 02/11/2015 7:18 PM

## 2015-02-12 ENCOUNTER — Encounter (HOSPITAL_COMMUNITY)
Admission: AD | Disposition: A | Discharge status: Home / Self Care | Payer: Self-pay | Source: Ambulatory Visit | Attending: Cardiology

## 2015-02-12 ENCOUNTER — Encounter (HOSPITAL_COMMUNITY): Payer: Self-pay | Admitting: General Practice

## 2015-02-12 ENCOUNTER — Ambulatory Visit (HOSPITAL_COMMUNITY): Admission: RE | Admit: 2015-02-12 | Payer: Medicare Other | Source: Ambulatory Visit | Admitting: Cardiovascular Disease

## 2015-02-12 ENCOUNTER — Other Ambulatory Visit: Payer: Self-pay

## 2015-02-12 DIAGNOSIS — R079 Chest pain, unspecified: Secondary | ICD-10-CM

## 2015-02-12 DIAGNOSIS — I10 Essential (primary) hypertension: Secondary | ICD-10-CM | POA: Diagnosis not present

## 2015-02-12 DIAGNOSIS — E119 Type 2 diabetes mellitus without complications: Secondary | ICD-10-CM | POA: Diagnosis not present

## 2015-02-12 DIAGNOSIS — I25110 Atherosclerotic heart disease of native coronary artery with unstable angina pectoris: Secondary | ICD-10-CM | POA: Diagnosis not present

## 2015-02-12 DIAGNOSIS — G4733 Obstructive sleep apnea (adult) (pediatric): Secondary | ICD-10-CM | POA: Diagnosis not present

## 2015-02-12 HISTORY — PX: LEFT HEART CATHETERIZATION WITH CORONARY ANGIOGRAM: SHX5451

## 2015-02-12 HISTORY — PX: CARDIAC CATHETERIZATION: SHX172

## 2015-02-12 LAB — GLUCOSE, CAPILLARY
GLUCOSE-CAPILLARY: 74 mg/dL (ref 70–99)
GLUCOSE-CAPILLARY: 73 mg/dL (ref 70–99)
Glucose-Capillary: 100 mg/dL — ABNORMAL HIGH (ref 70–99)
Glucose-Capillary: 121 mg/dL — ABNORMAL HIGH (ref 70–99)

## 2015-02-12 LAB — CBC
HCT: 38.6 % (ref 36.0–46.0)
Hemoglobin: 12 g/dL (ref 12.0–15.0)
MCH: 28.2 pg (ref 26.0–34.0)
MCHC: 31.1 g/dL (ref 30.0–36.0)
MCV: 90.6 fL (ref 78.0–100.0)
PLATELETS: 157 10*3/uL (ref 150–400)
RBC: 4.26 MIL/uL (ref 3.87–5.11)
RDW: 14.1 % (ref 11.5–15.5)
WBC: 3.2 10*3/uL — ABNORMAL LOW (ref 4.0–10.5)

## 2015-02-12 LAB — TROPONIN I: Troponin I: <0.03 ng/mL (ref <0.031)

## 2015-02-12 LAB — HEPARIN LEVEL (UNFRACTIONATED): Heparin Unfractionated: 0.39 IU/mL (ref 0.30–0.70)

## 2015-02-12 SURGERY — LEFT HEART CATHETERIZATION WITH CORONARY ANGIOGRAM

## 2015-02-12 MED ORDER — ISOSORBIDE MONONITRATE ER 60 MG PO TB24
60.0000 mg | ORAL_TABLET | Freq: Every day | ORAL | Status: DC
Start: 2015-02-13 — End: 2015-03-13

## 2015-02-12 MED ORDER — MIDAZOLAM HCL 2 MG/2ML IJ SOLN
INTRAMUSCULAR | Status: AC
  Filled 2015-02-12: qty 2

## 2015-02-12 MED ORDER — HEPARIN (PORCINE) IN NACL 2-0.9 UNIT/ML-% IJ SOLN
INTRAMUSCULAR | Status: AC
  Filled 2015-02-12: qty 1000

## 2015-02-12 MED ORDER — VERAPAMIL HCL 2.5 MG/ML IV SOLN
INTRAVENOUS | Status: AC
  Filled 2015-02-12: qty 2

## 2015-02-12 MED ORDER — ISOSORBIDE MONONITRATE ER 60 MG PO TB24: 60.0000 mg | ORAL_TABLET | Freq: Every day | ORAL | Status: DC

## 2015-02-12 MED ORDER — ONDANSETRON HCL 4 MG/2ML IJ SOLN: 4.0000 mg | Freq: Four times a day (QID) | INTRAMUSCULAR | Status: DC | PRN

## 2015-02-12 MED ORDER — ASPIRIN 81 MG PO CHEW
CHEWABLE_TABLET | ORAL | Status: AC
  Filled 2015-02-12: qty 1

## 2015-02-12 MED ORDER — SODIUM CHLORIDE 0.9 % IV SOLN
INTRAVENOUS | Status: DC
  Administered 2015-02-12: 12:00:00 via INTRAVENOUS

## 2015-02-12 MED ORDER — FENTANYL CITRATE 0.05 MG/ML IJ SOLN
INTRAMUSCULAR | Status: AC
  Filled 2015-02-12: qty 2

## 2015-02-12 MED ORDER — LIDOCAINE HCL (PF) 1 % IJ SOLN
INTRAMUSCULAR | Status: AC
  Filled 2015-02-12: qty 30

## 2015-02-12 MED ORDER — ACETAMINOPHEN 325 MG PO TABS: 650.0000 mg | ORAL_TABLET | ORAL | Status: DC | PRN

## 2015-02-12 MED ORDER — HEPARIN SODIUM (PORCINE) 1000 UNIT/ML IJ SOLN
INTRAMUSCULAR | Status: AC
  Filled 2015-02-12: qty 1

## 2015-02-12 MED ORDER — NITROGLYCERIN 1 MG/10 ML FOR IR/CATH LAB
INTRA_ARTERIAL | Status: AC
  Filled 2015-02-12: qty 10

## 2015-02-12 MED ORDER — ATORVASTATIN CALCIUM 80 MG PO TABS: 80.0000 mg | ORAL_TABLET | Freq: Every day | ORAL | Status: DC

## 2015-02-12 MED ORDER — ASPIRIN EC 81 MG PO TBEC: 81.0000 mg | DELAYED_RELEASE_TABLET | Freq: Every day | ORAL | Status: DC

## 2015-02-12 NOTE — Progress Notes (Signed)
Subjective: Just back from the cath lab.  No complaints  Objective: Vital signs in last 24 hours: Temp:  [97.6 F (36.4 C)-98.1 F (36.7 C)] 98.1 F (36.7 C) (03/22 0557) Pulse Rate:  [26-80] 80 (03/22 1033) Resp:  [16-22] 22 (03/22 1033) BP: (90-142)/(60-90) 115/85 mmHg (03/22 1033) SpO2:  [97 %-99 %] 97 % (03/22 1033) Weight:  [194 lb 12.8 oz (88.361 kg)-199 lb 11.8 oz (90.6 kg)] 199 lb 11.8 oz (90.6 kg) (03/21 1732)    Intake/Output from previous day: 03/21 0701 - 03/22 0700 In: 120 [P.O.:120] Out: -  Intake/Output this shift:    Medications Current Facility-Administered Medications  Medication Dose Route Frequency Provider Last Rate Last Dose  . 0.9 %  sodium chloride infusion  250 mL Intravenous PRN Rosalio Macadamia, NP      . 0.9 %  sodium chloride infusion   Intravenous Continuous Rosalio Macadamia, NP 50 mL/hr at 02/11/15 2031    . 0.9 %  sodium chloride infusion  250 mL Intravenous PRN Rosalio Macadamia, NP      . 0.9 %  sodium chloride infusion   Intravenous Continuous Lennette Bihari, MD 150 mL/hr at 02/12/15 1050 1,000 mL at 02/12/15 1050  . acetaminophen (TYLENOL) tablet 650 mg  650 mg Oral Q4H PRN Rosalio Macadamia, NP      . aspirin EC tablet 81 mg  81 mg Oral Daily Rosalio Macadamia, NP      . glipiZIDE (GLUCOTROL) tablet 10 mg  10 mg Oral QAC breakfast Rosalio Macadamia, NP   10 mg at 02/12/15 0630  . heparin ADULT infusion 100 units/mL (25000 units/250 mL)  900 Units/hr Intravenous Continuous Titus Mould, RPH 9 mL/hr at 02/11/15 2031 900 Units/hr at 02/11/15 2031  . hydrochlorothiazide (HYDRODIURIL) tablet 25 mg  25 mg Oral Daily Rosalio Macadamia, NP   25 mg at 02/11/15 2305  . insulin glargine (LANTUS) injection 30 Units  30 Units Subcutaneous QHS Rosalio Macadamia, NP   30 Units at 02/11/15 2304  . [START ON 02/13/2015] isosorbide mononitrate (IMDUR) 24 hr tablet 60 mg  60 mg Oral Daily Lennette Bihari, MD      . losartan (COZAAR) tablet 50 mg  50 mg Oral Daily Rosalio Macadamia, NP   50 mg at 02/11/15 2305  . metoprolol tartrate (LOPRESSOR) tablet 25 mg  25 mg Oral BID Rosalio Macadamia, NP   25 mg at 02/11/15 2140  . nitroGLYCERIN (NITROSTAT) SL tablet 0.4 mg  0.4 mg Sublingual Q5 Min x 3 PRN Rosalio Macadamia, NP      . ondansetron [facility]) injection 4 mg  4 mg Intravenous Q6H PRN Rosalio Macadamia, NP      . pantoprazole (PROTONIX) EC tablet 40 mg  40 mg Oral Daily Rosalio Macadamia, NP   40 mg at 02/11/15 2304  . simvastatin (ZOCOR) tablet 40 mg  40 mg Oral q1800 Rosalio Macadamia, NP   40 mg at 02/11/15 2306  . sodium chloride 0.9 % injection 3 mL  3 mL Intravenous Q12H Rosalio Macadamia, NP   3 mL at 02/11/15 2314  . sodium chloride 0.9 % injection 3 mL  3 mL Intravenous PRN Rosalio Macadamia, NP      . sodium chloride 0.9 % injection 3 mL  3 mL Intravenous Q12H Rosalio Macadamia, NP   3 mL at 02/11/15 2311  . sodium chloride 0.9 % injection  3 mL  3 mL Intravenous PRN Rosalio Macadamia, NP      . traMADol Janean Sark) tablet 50 mg  50 mg Oral Q12H PRN Rosalio Macadamia, NP        PE: General appearance: alert, cooperative and no distress Lungs: clear to auscultation bilaterally Heart: regular rate and rhythm, S1, S2 normal, no murmur, click, rub or gallop Extremities: No LEE Pulses: 2+ and symmetric Skin: Warm and dry Neurologic: Grossly normal  Lab Results:   Recent Labs  02/11/15 1816 02/12/15 0528  WBC 3.5* 3.2*  HGB 13.4 12.0  HCT 41.1 38.6  PLT 180 157   BMET  Recent Labs  02/11/15 1816  NA 137  K 3.9  CL 100  CO2 31  GLUCOSE 102*  BUN 18  CREATININE 0.94  CALCIUM 10.3   PT/INR  Recent Labs  02/11/15 1816  LABPROT 14.5  INR 1.11   Cardiac Panel (last 3 results)  Recent Labs  02/11/15 1816 02/11/15 2255 02/12/15 0528  TROPONINI <0.03 <0.03 <0.03    Assessment/Plan  Active Problems:   Unstable angina   DM   HTN   HLD  SP left heart cath this morning revealing normal LV function with an ejection fraction of approximately  55%.  Mild coronary obstructive disease with evidence for mild coronary calcification of the proximal LAD with segmental 30% tandem proximal to mid LAD stenoses; 80% proximal stenosis in a diminutive ramus intermediate vessel; normal large left circumflex coronary artery; mild 40% proximal, 40-50% stenosis just beyond the acute margin and 40-50% mid PDA stenoses.   Imdur increased to 60.   BP stable.  DC home later today.       HAGER, BRYAN PA-C 02/12/2015 11:13 AM  I have seen and examined the patient along with HAGER, BRYAN PA-C.  I have reviewed the chart, notes and new data.  I agree with PA's note.  PLAN: Enhanced antianginal medical therapy. DC home after bed rest complete  Thurmon Fair, MD, [facility] [phone] 02/12/2015, 12:47 PM

## 2015-02-12 NOTE — Interval H&P Note (Signed)
Cath Lab Visit (complete for each Cath Lab visit)  Clinical Evaluation Leading to the Procedure:   ACS: No.  Non-ACS:    Anginal Classification: CCS III  Anti-ischemic medical therapy: Maximal Therapy (2 or more classes of medications)  Non-Invasive Test Results: High-risk stress test findings: cardiac mortality >3%/year  Prior CABG: No previous CABG      History and Physical Interval Note:  02/12/2015 9:26 AM  Kristen Weber  has presented today for surgery, with the diagnosis of cp  The various methods of treatment have been discussed with the patient and family. After consideration of risks, benefits and other options for treatment, the patient has consented to  Procedure(s): LEFT HEART CATHETERIZATION WITH CORONARY ANGIOGRAM (N/A) as a surgical intervention .  The patient's history has been reviewed, patient examined, no change in status, stable for surgery.  I have reviewed the patient's chart and labs.  Questions were answered to the patient's satisfaction.     Coutney Wildermuth A

## 2015-02-12 NOTE — CV Procedure (Signed)
Kristen Weber is a 81 y.o. female    [id number]  [id number] LOCATION:  FACILITY: [facility]  PHYSICIAN: Lennette Bihari, MD, [facility] [birth year]   DATE OF PROCEDURE:  02/12/2015    CARDIAC CATHETERIZATION     HISTORY:    Kristen Weber is a 81 y.o. female who has a history of diabetes mellitus, hypertension, hyperlipidemia, and obstructive sleep apnea.  Remotely, she had an abnormal nuclear perfusion study at [facility] in 2014, however, there was artifact noted on that study.  Patient has experienced episodes of chest pain.  She was seen by Ledell Peoples yesterday and admitted.  She presents now for definitive cardiac catheterization.    PROCEDURE: Left heart catheterization: coronary angiography, left ventriculography   The patient was brought to the [facility] cardiac catherization laboratory in the fasting state.  The right radial artery and right femoral region were prepped and draped.  A right radial pulse was not very strong.  She was premedicated with Versed 2 mg and fentanyl 25 g. Several attempts were made for access and this ultimately was aborted and catheterization was transitioned to the femoral approach.  Right femoral access was obtained without difficulty.  Xylocaine 1% was used for local anesthesia. A 5 French sheath was inserted into the R femoral artery. Diagnostic catheterizatiion was done with 5 [place] FL5, FR4, and pigtail catheters. Left ventriculography was done with  25 cc Omnipaque contrast. Hemostasis was obtained by direct manual compression. The patient tolerated the procedure well.   HEMODYNAMICS:   Central Aorta: 126/73   Left Ventricle: 126/2  ANGIOGRAPHY:  Left main:  Angiographically short vessel which  trifurcated into a large LAD, a diminutive ramus intermediate vessel, and a large left circumflex coronary artery.  LAD: There evidence for mild proximal calcification of the LAD.  The LAD gave rise to 2 proximal diagonal vessels.  There was scattered 30% mild  narrowing in the LAD between the first and second diagonal, and also after the second diagonal vessel.  The LAD was large caliber and extended to the left ventricular apex.  The vessel gave rise to several septal perforating arteries.    Ramus Intermediate: Very small diminutive vessel that had 80% proximal diffuse stenosis in a vessel that was approximately 1.5 mm.   Left circumflex:  Large vessel that gave rise to one major bifurcating obtuse marginal branch.  The circumflex was free of significant disease.  Right coronary artery:  Moderate sized vessel that had smooth 40% eccentric proximal narrowing.  There was 40-50% narrowing just beyond the acute margin.  There was 40-50% mid PDA stenosis.   Left ventriculography revealed  normal LV function with an ejection fraction of 55%.  There were no significant wall motion abnormalities.  There is no evidence for mitral regurgitation.    Total contrast used: 80 cc Omnipaque    IMPRESSION:  Normal LV function with an ejection fraction of approximately 55%.    Mild coronary obstructive disease with evidence for mild coronary calcification of the proximal LAD with segmental 30% tandem proximal to mid LAD stenoses; 80% proximal stenosis in a diminutive ramus intermediate vessel; normal large left circumflex coronary artery; mild 40% proximal, 40-50% stenosis just beyond the acute margin and 40-50% mid PDA stenoses.    RECOMMENDATION:  Medical therapy.      Lennette Bihari, MD, [facility] 02/12/2015 10:30 AM

## 2015-02-12 NOTE — Progress Notes (Signed)
ANTICOAGULATION CONSULT NOTE  Pharmacy Consult for Heparin Indication: chest pain/ACS  Allergies  Allergen Reactions  . Lisinopril Cough    cough    Patient Measurements: Weight: 199 lb 11.8 oz (90.6 kg)  IBW 55 kg Heparin Dosing Weight: 75 kg  Vital Signs: Temp: 98.1 F (36.7 C) (03/21 2011) Temp Source: Oral (03/21 2011) BP: 132/67 mmHg (03/21 2011) Pulse Rate: 50 (03/21 2130)  Labs:  Recent Labs  02/11/15 1816 02/11/15 2255 02/12/15 0250  HGB 13.4  --   --   HCT 41.1  --   --   PLT 180  --   --   APTT 36  --   --   LABPROT 14.5  --   --   INR 1.11  --   --   HEPARINUNFRC  --   --  0.39  CREATININE 0.94  --   --   TROPONINI <0.03 <0.03  --     Estimated Creatinine Clearance: 53.8 mL/min (by C-G formula based on Cr of 0.94).  Assessment: 82 y.o. female with chest pain for heparin  Goal of Therapy:  Heparin level 0.3-0.7 units/ml Monitor platelets by anticoagulation protocol: Yes   Plan:  Continue Heparin at current rate  F/U after cath  Geannie Risen, PharmD, BCPS

## 2015-02-12 NOTE — Discharge Summary (Signed)
Physician Discharge Summary      Cardiologist: Skains Patient ID: Kristen Weber MRN: [id number] DOB/AGE: September 30, 1934 81 y.o.  Admit date: 02/11/2015 Discharge date: 02/12/2015  Admission Diagnoses:  Unstable Angina  Discharge Diagnoses:  Active Problems:   Unstable angina   DM  HTN  HLD   Discharged Condition: stable  Hospital Course:  The patient is a 79 female with a history of diabetes, hypertension, hyperlipidemia, OA and tremor who underwent nuclear stress test noted in June of 2014 at [facility] interpreted as probably abnormal myocardial perfusion study with a large, mildly severe mid anterior, mid anteroseptal, apical anterior, apical septal and apical nearly completely reversible defect suggestive of probable ischemia or possible attenuation artifact. There is moderate motion artifact, sensitivity, specificity of the study is reduced by the noted artifact. No TID. She has been managed medically. She previously had no symptoms reported.  Saw Dr. Anne Fu over one year ago. He favored continued medical management but noted that he would pursue cardiac cath if she became symptomatic.  Last seen by me in November of 2015 - she was having chest pain - I arranged for a cardiac cath but this was cancelled due to pre op CXR showing pneumonia. Her pneumonia was treated - her chest pain was resolved and the patient and family opted for continued observation with medical management.   Saw Dr. Anne Fu this past January - was doing well and again, wanted to hold off on cardiac catheterization.  Records from Care Everywhere reviewed - was at [facility] over the weekend - Was seen for unstable angina over the weekend - had had several days of chest pain - intermittent - associated with shortness of breath as well. Worse with activities. HR noted to be 100 by EKG. She agreed to proceeding on with cardiac cath - Had arrangements to be transferred for inpatient cath - patient then refused - wanted to be  seen back in the office first. Thus added to my schedule in the [facility] clinic for today.  Comes back today. Here with family. They are all very upset - apparently they were told to be here this morning at 10 am to see Dr. Anne Fu and had a cardiac cath arranged for 4pm today with Dr. [place] (which has been cancelled) - They waited for a while - went to lunch and then came back - basically here since 10 am today. I was not made aware of this. She is willing to proceed on with cardiac cath. Chest pain continues. Had it yesterday. Tells me that this started last Wednesday - feels like a heaviness across her chest - has happened every day - went to the ER on Saturday - did not want to be transferred due to having to care for her husband. Had more discomfort yesterday with vacuuming. Her spells will last 30 minutes to an hour or more. Feels short of breath. She used NTG yesterday with relief. Has not had any today. Lives [place] fair distance away.   She was admitted for a left heart cath which was completed and revealed normal LV function with an ejection fraction of approximately 55%. Mild coronary obstructive disease with evidence for mild coronary calcification of the proximal LAD with segmental 30% tandem proximal to mid LAD stenoses; 80% proximal stenosis in a diminutive ramus intermediate vessel; normal large left circumflex coronary artery; mild 40% proximal, 40-50% stenosis just beyond the acute margin and 40-50% mid PDA stenoses. Imdur was increased to 60.  BP stable. The patient was  seen by Dr. Royann Shivers who felt she was stable for DC home.   Consults: None  Significant Diagnostic Studies: CARDIAC CATHETERIZATION   PROCEDURE: Left heart catheterization: coronary angiography, left ventriculography   The patient was brought to the [facility] cardiac catherization laboratory in the fasting state. The right radial artery and right femoral region were prepped and draped. A right radial pulse was not  very strong. She was premedicated with Versed 2 mg and fentanyl 25 g. Several attempts were made for access and this ultimately was aborted and catheterization was transitioned to the femoral approach. Right femoral access was obtained without difficulty. Xylocaine 1% was used for local anesthesia. A 5 French sheath was inserted into the R femoral artery. Diagnostic catheterizatiion was done with 5 [place] FL5, FR4, and pigtail catheters. Left ventriculography was done with 25 cc Omnipaque contrast. Hemostasis was obtained by direct manual compression. The patient tolerated the procedure well.   HEMODYNAMICS:  Central Aorta: 126/73  Left Ventricle: 126/2  ANGIOGRAPHY:  Left main: Angiographically short vessel which trifurcated into a large LAD, a diminutive ramus intermediate vessel, and a large left circumflex coronary artery.  LAD: There evidence for mild proximal calcification of the LAD. The LAD gave rise to 2 proximal diagonal vessels. There was scattered 30% mild narrowing in the LAD between the first and second diagonal, and also after the second diagonal vessel. The LAD was large caliber and extended to the left ventricular apex. The vessel gave rise to several septal perforating arteries.   Ramus Intermediate: Very small diminutive vessel that had 80% proximal diffuse stenosis in a vessel that was approximately 1.5 mm.   Left circumflex: Large vessel that gave rise to one major bifurcating obtuse marginal branch. The circumflex was free of significant disease.  Right coronary artery: Moderate sized vessel that had smooth 40% eccentric proximal narrowing. There was 40-50% narrowing just beyond the acute margin. There was 40-50% mid PDA stenosis.  Left ventriculography revealed normal LV function with an ejection fraction of 55%. There were no significant wall motion abnormalities. There is no evidence for mitral regurgitation.   Total contrast used: 80 cc  Omnipaque   IMPRESSION:  Normal LV function with an ejection fraction of approximately 55%.   Mild coronary obstructive disease with evidence for mild coronary calcification of the proximal LAD with segmental 30% tandem proximal to mid LAD stenoses; 80% proximal stenosis in a diminutive ramus intermediate vessel; normal large left circumflex coronary artery; mild 40% proximal, 40-50% stenosis just beyond the acute margin and 40-50% mid PDA stenoses.   RECOMMENDATION:  Medical therapy.  Lennette Bihari, MD, [facility] 02/12/2015  Treatments: See above  Discharge Exam: Blood pressure 120/65, pulse 75, temperature 98.2 F (36.8 C), temperature source Oral, resp. rate 22, weight 199 lb 11.8 oz (90.6 kg), SpO2 100 %.   Disposition: Final discharge disposition not confirmed     Medication List    TAKE these medications        ACCU-CHEK AVIVA PLUS test strip  Generic drug:  glucose blood  1 each by Other route 2 (two) times daily.     aspirin EC 81 MG tablet  Take 81 mg by mouth daily.     GLIPIZIDE XL 10 MG 24 hr tablet  Generic drug:  glipiZIDE  Take 10 mg by mouth daily.     hydrochlorothiazide 25 MG tablet  Commonly known as:  HYDRODIURIL  Take 25 mg by mouth daily.     isosorbide mononitrate 60 MG  24 hr tablet  Commonly known as:  IMDUR  Take 1 tablet (60 mg total) by mouth daily.  Start taking on:  02/13/2015     LANTUS SOLOSTAR 100 UNIT/ML Solostar Pen  Generic drug:  Insulin Glargine  28 Units daily.     losartan 50 MG tablet  Commonly known as:  COZAAR  Take 50 mg by mouth daily.     metoprolol tartrate 25 MG tablet  Commonly known as:  LOPRESSOR  Take 1 tablet (25 mg total) by mouth 2 (two) times daily.     nitroGLYCERIN 0.4 MG SL tablet  Commonly known as:  NITROSTAT  Place 1 tablet (0.4 mg total) under the tongue every 5 (five) minutes as needed.     omeprazole 20 MG capsule  Commonly known as:  PRILOSEC  Take 20 mg by mouth daily as needed  (indigestion).     OVER THE COUNTER MEDICATION  Apply 1 patch topically daily as needed (back pain). Pain patches     polyethylene glycol packet  Commonly known as:  MIRALAX / GLYCOLAX  Take 17 g by mouth daily as needed for mild constipation, moderate constipation or severe constipation.     simvastatin 40 MG tablet  Commonly known as:  ZOCOR  Take 40 mg by mouth daily.     traMADol 50 MG tablet  Commonly known as:  ULTRAM  Take 50 mg by mouth as directed.     Vitamin D (Ergocalciferol) 50000 UNITS Caps capsule  Commonly known as:  DRISDOL  Take 50,000 Units by mouth every 7 (seven) days.       Follow-up Information    Follow up with Norma Fredrickson, NP.   Specialty:  Nurse Practitioner   Why:  The office will call you with the follow up appt date and time.    Contact information:   [address] [place] [address] [phone]      Greater than 30 minutes was spent completing the patient's discharge.    SignedWilburt Finlay, PAC 02/12/2015, 3:48 PM

## 2015-02-12 NOTE — Progress Notes (Signed)
Site area: RFA Site Prior to Removal:  Level 0 Pressure Applied For:24min Manual:   yes Patient Status During Pull:stable   Post Pull Site:  Level 0 Post Pull Instructions Given:  yes Post Pull Pulses Present: doppler Dressing Applied: clear  Bedrest begins @1115   Comments:

## 2015-02-12 NOTE — Progress Notes (Signed)
UR completed 

## 2015-02-13 ENCOUNTER — Encounter (HOSPITAL_COMMUNITY): Payer: Self-pay | Admitting: Cardiovascular Disease

## 2015-02-13 LAB — HEMOGLOBIN A1C
Hgb A1c MFr Bld: 6.6 % — ABNORMAL HIGH (ref 4.8–5.6)
Mean Plasma Glucose: 143 mg/dL

## 2015-02-14 LAB — POCT ACTIVATED CLOTTING TIME: ACTIVATED CLOTTING TIME: 104 s

## 2015-03-13 ENCOUNTER — Ambulatory Visit (INDEPENDENT_AMBULATORY_CARE_PROVIDER_SITE_OTHER): Payer: Medicare Other | Admitting: Nurse Practitioner

## 2015-03-13 ENCOUNTER — Encounter: Payer: Self-pay | Admitting: Nurse Practitioner

## 2015-03-13 VITALS — BP 98/68 | HR 87 | Resp 18 | Ht 65.0 in | Wt 192.0 lb

## 2015-03-13 DIAGNOSIS — R9439 Abnormal result of other cardiovascular function study: Secondary | ICD-10-CM

## 2015-03-13 DIAGNOSIS — E785 Hyperlipidemia, unspecified: Secondary | ICD-10-CM | POA: Diagnosis not present

## 2015-03-13 DIAGNOSIS — Z9889 Other specified postprocedural states: Secondary | ICD-10-CM

## 2015-03-13 DIAGNOSIS — I10 Essential (primary) hypertension: Secondary | ICD-10-CM

## 2015-03-13 MED ORDER — OMEPRAZOLE 20 MG PO CPDR: 20.0000 mg | DELAYED_RELEASE_CAPSULE | Freq: Every day | ORAL | Status: DC

## 2015-03-13 NOTE — Patient Instructions (Addendum)
No Lab Today  Medication Instructions: Continue with your current medicines but take your Prilosec every day - I have sent this to your drug store.  Verify your dose of Imdur at home today and call me if it is different from the 30 mg that is listed.    Testing/Procedures to be arranged: NONE   Follow-Up: See Dr. Anne Fu back in 6 months   Other Special Instructions: N/A  Call the [facility] office at [phone] if you have any questions, problems or concerns.

## 2015-03-13 NOTE — Progress Notes (Signed)
CARDIOLOGY OFFICE NOTE  Date:  03/13/2015    Kristen Weber Date of Birth: 07/13/1934 Medical Record #[id number]  PCP:  Jonetta Osgood, NP  Cardiologist:  [facility]    Chief Complaint  Patient presents with  . S/P cardiac catheterization    Seen for Dr. Anne Fu     History of Present Illness: Kristen Weber is a 81 y.o. female who presents today for a post cardiac cath visit. Seen for Dr. Anne Fu. She has a history of diabetes, hypertension, hyperlipidemia, OA and tremor who underwent nuclear stress test noted in June of 2014 at [facility] interpreted as probably abnormal myocardial perfusion study with a large, mildly severe mid anterior, mid anteroseptal, apical anterior, apical septal and apical nearly completely reversible defect suggestive of probable ischemia or possible attenuation artifact. There is moderate motion artifact, sensitivity, specificity of the study is reduced by the noted artifact. No TID. She has been managed medically. She previously had no symptoms reported.  I saw her in November of 2015 - she was having chest pain - I arranged for a cardiac cath but this was cancelled due to pre op CXR showing pneumonia. Her pneumonia was treated - her chest pain was resolved and the patient and family opted for continued observation with medical management.   Presented back to ER at [facility] last month with chest pain - refused admission/transfer to [facility]. Seen back in the office and was admitted for cardiac cath - see results below - she is to be managed medically.   Comes back today. Here with her family. She is doing ok. Has woken up with 2 spells of discomfort - no exertional symptoms whatsoever. She is active. Family feels it is more GI related. She typically will not take her Prilosec. Diet is pretty bad. Not dizzy or lightheaded. BP ok at home. Not sure if dose of Imdur is correct - will need to verify.   Past Medical History  Diagnosis Date  . H/O: hysterectomy   . Abnormal  cardiovascular stress test 09/20/2013    Possibly abnormal nuclear stress at [place]-anteroseptal abnormality interpreted as possible breast attenuation as well, motion artifact. Normal EF. No TID. Watching medically  . Essential hypertension 09/20/2013  . Hyperlipidemia 09/20/2013  . Sleep apnea     uses cpap  . Diabetes mellitus     Type 2  . Diabetes 09/20/2013  . RA (rheumatoid arthritis)     Past Surgical History  Procedure Laterality Date  . Total abdominal hysterectomy    . Cardiac catheterization  02/12/2015  . Left heart catheterization with coronary angiogram N/A 02/12/2015    Procedure: LEFT HEART CATHETERIZATION WITH CORONARY ANGIOGRAM;  Surgeon: Lennette Bihari, MD;  Location: [facility] CATH LAB;  Service: Cardiovascular;  Laterality: N/A;     Medications: Current Outpatient Prescriptions  Medication Sig Dispense Refill  . ACCU-CHEK AVIVA PLUS test strip 1 each by Other route 2 (two) times daily.     Marland Kitchen aspirin EC 81 MG tablet Take 81 mg by mouth daily.    Marland Kitchen GLIPIZIDE XL 10 MG 24 hr tablet Take 10 mg by mouth daily.     . hydrochlorothiazide (HYDRODIURIL) 25 MG tablet Take 25 mg by mouth daily.     . isosorbide mononitrate (IMDUR) 30 MG 24 hr tablet Take 30 mg by mouth daily.  3  . LANTUS SOLOSTAR 100 UNIT/ML SOPN 28 Units daily.     Marland Kitchen losartan (COZAAR) 50 MG tablet Take 50 mg by mouth daily.     Marland Kitchen  metoprolol tartrate (LOPRESSOR) 25 MG tablet Take 1 tablet (25 mg total) by mouth 2 (two) times daily. 60 tablet 3  . nitroGLYCERIN (NITROSTAT) 0.4 MG SL tablet Place 1 tablet (0.4 mg total) under the tongue every 5 (five) minutes as needed. 25 tablet 6  . omeprazole (PRILOSEC) 20 MG capsule Take 1 capsule (20 mg total) by mouth daily. 90 capsule 3  . OVER THE COUNTER MEDICATION Apply 1 patch topically daily as needed (back pain). Pain patches    . polyethylene glycol (MIRALAX / GLYCOLAX) packet Take 17 g by mouth daily as needed for mild constipation, moderate constipation or severe  constipation.    . simvastatin (ZOCOR) 40 MG tablet Take 40 mg by mouth daily.  5  . traMADol (ULTRAM) 50 MG tablet Take 50 mg by mouth as directed.     . Vitamin D, Ergocalciferol, (DRISDOL) 50000 UNITS CAPS capsule Take 50,000 Units by mouth every 7 (seven) days.     No current facility-administered medications for this visit.    Allergies: Allergies  Allergen Reactions  . Lisinopril Cough    cough    Social History: The patient  reports that she has never smoked. She has never used smokeless tobacco. She reports that she does not drink alcohol or use illicit drugs.   Family History: The patient's family history includes Diabetes in her mother.   Review of Systems: Please see the history of present illness.  Has had recent GI "bug". This is resolved.    All other systems are reviewed and negative.   Physical Exam: VS:  BP 98/68 mmHg  Pulse 87  Resp 18  Ht 5\' 5"  (1.651 m)  Wt 192 lb (87.091 kg)  BMI 31.95 kg/m2  SpO2 99% .  BMI Body mass index is 31.95 kg/(m^2).  Wt Readings from Last 3 Encounters:  03/13/15 192 lb (87.091 kg)  02/11/15 199 lb 11.8 oz (90.6 kg)  02/11/15 194 lb 12.8 oz (88.361 kg)    General: Pleasant. Well developed, well nourished and in no acute distress.  HEENT: Normal. Neck: Supple, no JVD, carotid bruits, or masses noted.  Cardiac: Regular rate and rhythm. No murmurs, rubs, or gallops. No edema.  Respiratory:  Lungs are clear to auscultation bilaterally with normal work of breathing.  GI: Soft and nontender.  MS: No deformity or atrophy. Gait and ROM intact. Skin: Warm and dry. Color is normal.  Neuro:  Strength and sensation are intact and no gross focal deficits noted.  Psych: Alert, appropriate and with normal affect.   LABORATORY DATA:  EKG:  EKG is not ordered today..  Lab Results  Component Value Date   WBC 3.2* 02/12/2015   HGB 12.0 02/12/2015   HCT 38.6 02/12/2015   PLT 157 02/12/2015   GLUCOSE 102* 02/11/2015   CHOL 184  10/17/2014   TRIG 311.0* 10/17/2014   HDL 44.80 10/17/2014   LDLDIRECT 64.8 10/17/2014   ALT 18 02/11/2015   AST 23 02/11/2015   NA 137 02/11/2015   K 3.9 02/11/2015   CL 100 02/11/2015   CREATININE 0.94 02/11/2015   BUN 18 02/11/2015   CO2 31 02/11/2015   TSH 1.270 02/11/2015   INR 1.11 02/11/2015   HGBA1C 6.6* 02/11/2015    BNP (last 3 results)  Recent Labs  02/11/15 1816  BNP 57.1    ProBNP (last 3 results) No results for input(s): PROBNP in the last 8760 hours.   Other Studies Reviewed Today:   Nuclear Cardiology Report From  Care Everywhere Dated June 2014  Nuclear images findings: Rest and stress images were reviewed. There was a large size, mild in  severity mid anterior, apical anterior, mid anteroseptal, apical septal  and apical partially reversible defect suggestive of probable ischemia or  possible attenuation. Review of raw data suggest motion artifact. TID  estimate of 0.86. Summed stress score of 13, Summed rest score of 2, and  Summed difference score of 11. Gated imaging revealed no wall motion with  an estimated ejection fraction of greater than 65%. Right ventricle is  noted to be normal in size and function.   CARDIAC CATHETERIZATION     PROCEDURE: Left heart catheterization: coronary angiography, left ventriculography   ANGIOGRAPHY:  Left main: Angiographically short vessel which trifurcated into a large LAD, a diminutive ramus intermediate vessel, and a large left circumflex coronary artery.  LAD: There evidence for mild proximal calcification of the LAD. The LAD gave rise to 2 proximal diagonal vessels. There was scattered 30% mild narrowing in the LAD between the first and second diagonal, and also after the second diagonal vessel. The LAD was large caliber and extended to the left ventricular apex. The vessel gave rise to several septal perforating arteries.   Ramus Intermediate: Very small diminutive vessel that had 80%  proximal diffuse stenosis in a vessel that was approximately 1.5 mm.   Left circumflex: Large vessel that gave rise to one major bifurcating obtuse marginal branch. The circumflex was free of significant disease.  Right coronary artery: Moderate sized vessel that had smooth 40% eccentric proximal narrowing. There was 40-50% narrowing just beyond the acute margin. There was 40-50% mid PDA stenosis.   Left ventriculography revealed normal LV function with an ejection fraction of 55%. There were no significant wall motion abnormalities. There is no evidence for mitral regurgitation.   Total contrast used: 80 cc Omnipaque    IMPRESSION:  Normal LV function with an ejection fraction of approximately 55%.   Mild coronary obstructive disease with evidence for mild coronary calcification of the proximal LAD with segmental 30% tandem proximal to mid LAD stenoses; 80% proximal stenosis in a diminutive ramus intermediate vessel; normal large left circumflex coronary artery; mild 40% proximal, 40-50% stenosis just beyond the acute margin and 40-50% mid PDA stenoses.   RECOMMENDATION:  Medical therapy.  Lennette Bihari, MD, [facility] 02/12/2015 10:30 AM  Assessment/Plan:  1. Chest pain - with known previous abnormal nuclear study - now s/p cath - to manage medically - I have asked her to verify her dose of Imdur. Increase PPI to every day and not prn. Continue with her other medicines.   2. DM  3. HTN - recheck by me is 104/70. I have left her on her current regimen.   4. HLD - on statin  Current medicines are reviewed with the patient today.  The patient does not have concerns regarding medicines other than what has been noted above.  The following changes have been made:  See above.  Labs/ tests ordered today include:   No orders of the defined types were placed in this encounter.     Disposition:   FU with Dr. Anne Fu in 6 months.   Patient is agreeable to this plan and  will call if any problems develop in the interim.   Signed: Rosalio Macadamia, RN, ANP-C 03/13/2015 9:02 AM  [facility] [address] [place], [place]  [address] Phone: [phone] Fax: [phone]

## 2015-09-27 ENCOUNTER — Emergency Department (HOSPITAL_COMMUNITY)
Admission: EM | Admit: 2015-09-27 | Discharge: 2015-09-27 | Disposition: A | Discharge status: Home / Self Care | Payer: Medicare Other | Attending: Emergency Medicine | Admitting: Emergency Medicine

## 2015-09-27 ENCOUNTER — Encounter (HOSPITAL_COMMUNITY): Payer: Self-pay | Admitting: Family Medicine

## 2015-09-27 ENCOUNTER — Other Ambulatory Visit: Payer: Self-pay | Admitting: Cardiology

## 2015-09-27 ENCOUNTER — Emergency Department (HOSPITAL_COMMUNITY): Payer: Medicare Other

## 2015-09-27 ENCOUNTER — Ambulatory Visit (INDEPENDENT_AMBULATORY_CARE_PROVIDER_SITE_OTHER): Payer: Medicare Other

## 2015-09-27 DIAGNOSIS — R5383 Other fatigue: Secondary | ICD-10-CM | POA: Insufficient documentation

## 2015-09-27 DIAGNOSIS — Z9889 Other specified postprocedural states: Secondary | ICD-10-CM | POA: Diagnosis not present

## 2015-09-27 DIAGNOSIS — M069 Rheumatoid arthritis, unspecified: Secondary | ICD-10-CM | POA: Diagnosis not present

## 2015-09-27 DIAGNOSIS — Z90710 Acquired absence of both cervix and uterus: Secondary | ICD-10-CM | POA: Insufficient documentation

## 2015-09-27 DIAGNOSIS — Z7982 Long term (current) use of aspirin: Secondary | ICD-10-CM | POA: Diagnosis not present

## 2015-09-27 DIAGNOSIS — R001 Bradycardia, unspecified: Secondary | ICD-10-CM | POA: Diagnosis not present

## 2015-09-27 DIAGNOSIS — I493 Ventricular premature depolarization: Secondary | ICD-10-CM | POA: Diagnosis not present

## 2015-09-27 DIAGNOSIS — R9439 Abnormal result of other cardiovascular function study: Secondary | ICD-10-CM | POA: Diagnosis present

## 2015-09-27 DIAGNOSIS — R0602 Shortness of breath: Secondary | ICD-10-CM | POA: Diagnosis present

## 2015-09-27 DIAGNOSIS — G4730 Sleep apnea, unspecified: Secondary | ICD-10-CM | POA: Diagnosis not present

## 2015-09-27 DIAGNOSIS — Z9981 Dependence on supplemental oxygen: Secondary | ICD-10-CM | POA: Diagnosis not present

## 2015-09-27 DIAGNOSIS — I10 Essential (primary) hypertension: Secondary | ICD-10-CM | POA: Diagnosis not present

## 2015-09-27 DIAGNOSIS — D72819 Decreased white blood cell count, unspecified: Secondary | ICD-10-CM | POA: Diagnosis present

## 2015-09-27 DIAGNOSIS — E785 Hyperlipidemia, unspecified: Secondary | ICD-10-CM | POA: Diagnosis not present

## 2015-09-27 DIAGNOSIS — E119 Type 2 diabetes mellitus without complications: Secondary | ICD-10-CM | POA: Diagnosis not present

## 2015-09-27 DIAGNOSIS — E668 Other obesity: Secondary | ICD-10-CM | POA: Insufficient documentation

## 2015-09-27 DIAGNOSIS — I2510 Atherosclerotic heart disease of native coronary artery without angina pectoris: Secondary | ICD-10-CM | POA: Diagnosis present

## 2015-09-27 DIAGNOSIS — Z79899 Other long term (current) drug therapy: Secondary | ICD-10-CM | POA: Insufficient documentation

## 2015-09-27 DIAGNOSIS — Z794 Long term (current) use of insulin: Secondary | ICD-10-CM | POA: Diagnosis not present

## 2015-09-27 LAB — BASIC METABOLIC PANEL
Anion gap: 6 (ref 5–15)
BUN: 15 mg/dL (ref 6–20)
CHLORIDE: 105 mmol/L (ref 101–111)
CO2: 27 mmol/L (ref 22–32)
Calcium: 10.5 mg/dL — ABNORMAL HIGH (ref 8.9–10.3)
Creatinine, Ser: 1.06 mg/dL — ABNORMAL HIGH (ref 0.44–1.00)
GFR calc Af Amer: 56 mL/min — ABNORMAL LOW (ref >60)
GFR calc non Af Amer: 49 mL/min — ABNORMAL LOW (ref >60)
Glucose, Bld: 103 mg/dL — ABNORMAL HIGH (ref 65–99)
POTASSIUM: 3.7 mmol/L (ref 3.5–5.1)
SODIUM: 138 mmol/L (ref 135–145)

## 2015-09-27 LAB — CBC
HCT: 40.4 % (ref 36.0–46.0)
Hemoglobin: 12.6 g/dL (ref 12.0–15.0)
MCH: 28.4 pg (ref 26.0–34.0)
MCHC: 31.2 g/dL (ref 30.0–36.0)
MCV: 91 fL (ref 78.0–100.0)
Platelets: 156 10*3/uL (ref 150–400)
RBC: 4.44 MIL/uL (ref 3.87–5.11)
RDW: 14.1 % (ref 11.5–15.5)
WBC: 3 10*3/uL — AB (ref 4.0–10.5)

## 2015-09-27 LAB — I-STAT TROPONIN, ED: Troponin i, poc: 0.01 ng/mL (ref 0.00–0.08)

## 2015-09-27 NOTE — Consult Note (Signed)
Reason for Consult:   Fatigue, slow HR  Requesting Physician: ED MD Primary Cardiologist Dr Anne Fu  HPI  81 y/o overweight AA female seen by Dr Anne Fu in the past. She had an abnormal Nuclear stress at [facility] in 2014. She really didn't have symptoms of chest pain till Nov 2015. The plan was for her to have a cath b ut she had an abnormal CXR. Further work up suggested CAP. She was treated for this and did well until March 2016 when she had recurrent chest pain and it was decided to proceed with diagnostic cath. This was done 02/12/15 and revealed moderate CAD, plan was to continue medical Rx. Her LVF was normal.           Today she went to her PCP at [facility] with complaints of fatigue for the past week. "I'm just not myself". No syncope or pre syncope, she did have occasional dizziness. She denies any palpitations or dyspnea. At her PCP's office she was not have a heart rate in the "20's to 30's". This is not documented with telemetry or EKG. The pt says they took her pulse "with a machine". She was transported by EMS. En route she had PVCs, in the ED she has had PVCs and trigeminy. No slow HR have been documented.   PMHx:  Past Medical History  Diagnosis Date  . H/O: hysterectomy   . Abnormal cardiovascular stress test 09/20/2013    Possibly abnormal nuclear stress at [place]-anteroseptal abnormality interpreted as possible breast attenuation as well, motion artifact. Normal EF. No TID. Watching medically  . Essential hypertension 09/20/2013  . Hyperlipidemia 09/20/2013  . Sleep apnea     uses cpap  . Diabetes mellitus (HCC)     Type 2  . Diabetes (HCC) 09/20/2013  . RA (rheumatoid arthritis) [facility])     Past Surgical History  Procedure Laterality Date  . Total abdominal hysterectomy    . Cardiac catheterization  02/12/2015  . Left heart catheterization with coronary angiogram N/A 02/12/2015    Procedure: LEFT HEART CATHETERIZATION WITH CORONARY ANGIOGRAM;   Surgeon: Lennette Bihari, MD;  Location: [facility] CATH LAB;  Service: Cardiovascular;  Laterality: N/A;    SOCHx:  reports that she has never smoked. She has never used smokeless tobacco. She reports that she does not drink alcohol or use illicit drugs.  FAMHx: Family History  Problem Relation Age of Onset  . Diabetes Mother     ALLERGIES: Allergies  Allergen Reactions  . Lisinopril Cough    cough    ROS: Review of Systems: General: negative for chills, fever, night sweats or weight changes.  Cardiovascular: negative for chest pain, dyspnea on exertion, edema, orthopnea, palpitations, paroxysmal nocturnal dyspnea or shortness of breath HEENT: negative for any visual disturbances, blindness, glaucoma Dermatological: negative for rash Respiratory: negative for cough, hemoptysis, or wheezing Urologic: negative for hematuria or dysuria Abdominal: negative for nausea, vomiting, diarrhea, bright red blood per rectum, melena, or hematemesis Neurologic: negative for visual changes, syncope, or dizziness Musculoskeletal: negative for back pain, joint pain, or swelling Psych: cooperative and appropriate All other systems reviewed and are otherwise negative except as noted above.   HOME MEDICATIONS: Prior to Admission medications   Medication Sig Start Date End Date Taking? Authorizing Provider  aspirin EC 81 MG tablet Take 81 mg by mouth daily.   Yes Historical Provider, MD  GLIPIZIDE XL 10 MG 24 hr tablet Take 10 mg by mouth  daily.  09/07/13  Yes Historical Provider, MD  hydrochlorothiazide (HYDRODIURIL) 25 MG tablet Take 25 mg by mouth daily.  09/19/13  Yes Historical Provider, MD  isosorbide mononitrate (IMDUR) 30 MG 24 hr tablet Take 30 mg by mouth daily. 12/28/14  Yes Historical Provider, MD  LANTUS SOLOSTAR 100 UNIT/ML SOPN Inject 30 Units into the skin daily.  09/07/13  Yes Historical Provider, MD  metoprolol tartrate (LOPRESSOR) 25 MG tablet Take 1 tablet (25 mg total) by mouth 2 (two)  times daily. 10/17/14  Yes Rosalio Macadamia, NP  simvastatin (ZOCOR) 40 MG tablet Take 40 mg by mouth daily. 10/26/14  Yes Historical Provider, MD  ACCU-CHEK AVIVA PLUS test strip 1 each by Other route 2 (two) times daily.  02/09/14   Historical Provider, MD  losartan (COZAAR) 50 MG tablet Take 50 mg by mouth daily.  07/14/13   Historical Provider, MD  nitroGLYCERIN (NITROSTAT) 0.4 MG SL tablet Place 1 tablet (0.4 mg total) under the tongue every 5 (five) minutes as needed. 10/17/14   Rosalio Macadamia, NP  omeprazole (PRILOSEC) 20 MG capsule Take 1 capsule (20 mg total) by mouth daily. Patient taking differently: Take 20 mg by mouth daily as needed. For acid reflux 03/13/15 03/12/16  Rosalio Macadamia, NP  OVER THE COUNTER MEDICATION Apply 1 patch topically daily as needed (back pain). Pain patches    Historical Provider, MD  polyethylene glycol (MIRALAX / GLYCOLAX) packet Take 17 g by mouth daily as needed for mild constipation, moderate constipation or severe constipation.    Historical Provider, MD  traMADol (ULTRAM) 50 MG tablet Take 50 mg by mouth as directed.  09/19/13   Historical Provider, MD  Vitamin D, Ergocalciferol, (DRISDOL) 50000 UNITS CAPS capsule Take 50,000 Units by mouth every 7 (seven) days.    Historical Provider, MD    HOSPITAL MEDICATIONS: I have reviewed the patient's current medications.  VITALS: Blood pressure 130/97, pulse 74, temperature 97.7 F (36.5 C), temperature source Oral, resp. rate 17, SpO2 99 %.  PHYSICAL EXAM: General appearance: alert, cooperative, no distress and moderately obese Neck: no carotid bruit and no JVD Lungs: clear to auscultation bilaterally Heart: regular rate and rhythm Abdomen: obese, non tender Extremities: extremities normal, atraumatic, no cyanosis or edema Pulses: 2+ and symmetric Skin: Skin color, texture, turgor normal. No rashes or lesions Neurologic: Grossly normal  LABS: Results for orders placed or performed during the hospital  encounter of 09/27/15 (from the past 24 hour(s))  Basic metabolic panel     Status: Abnormal   Collection Time: 09/27/15 11:59 AM  Result Value Ref Range   Sodium 138 135 - 145 mmol/L   Potassium 3.7 3.5 - 5.1 mmol/L   Chloride 105 101 - 111 mmol/L   CO2 27 22 - 32 mmol/L   Glucose, Bld 103 (H) 65 - 99 mg/dL   BUN 15 6 - 20 mg/dL   Creatinine, Ser 5.49 (H) 0.44 - 1.00 mg/dL   Calcium 82.6 (H) 8.9 - 10.3 mg/dL   GFR calc non Af Amer 49 (L) >60 mL/min   GFR calc Af Amer 56 (L) >60 mL/min   Anion gap 6 5 - 15  CBC     Status: Abnormal   Collection Time: 09/27/15 11:59 AM  Result Value Ref Range   WBC 3.0 (L) 4.0 - 10.5 K/uL   RBC 4.44 3.87 - 5.11 MIL/uL   Hemoglobin 12.6 12.0 - 15.0 g/dL   HCT 41.5 83.0 - 94.0 %  MCV 91.0 78.0 - 100.0 fL   MCH 28.4 26.0 - 34.0 pg   MCHC 31.2 30.0 - 36.0 g/dL   RDW 77.4 12.8 - 78.6 %   Platelets 156 150 - 400 K/uL  I-stat troponin, ED (not at [facility])     Status: None   Collection Time: 09/27/15 12:27 PM  Result Value Ref Range   Troponin i, poc 0.01 0.00 - 0.08 ng/mL   Comment 3            EKG: NSR PVCs  IMAGING: Dg Chest 2 View  09/27/2015  CLINICAL DATA:  Shortness of breath, dyspnea on exertion for 7 days. EXAM: CHEST  2 VIEW COMPARISON:  02/09/2015 FINDINGS: Mild cardiomegaly without edema or CHF. No effusion or pneumothorax. Minor bibasilar streaky atelectasis versus scarring. Aorta is atherosclerotic and mildly ectatic. Degenerative changes of the spine. IMPRESSION: cardiomegaly without CHF or pneumonia Bibasilar atelectasis versus scarring. Stable exam. Electronically Signed   By: Judie Petit.  Shick M.D.   On: 09/27/2015 12:14    IMPRESSION: Principal Problem:   Fatigue Active Problems:   Frequent PVCs   Diabetes (HCC)   Essential hypertension   CAD (coronary artery disease)- mild CAD 02/12/15   Abnormal cardiovascular stress test 2014   Hyperlipidemia   Leukopenia-appeasrs to be chronic   RECOMMENDATION: It's possible her "slow  HR" was actually PVCs which are not always picked up by B/P machines and pulse monitors. Review of previous EKGs show she has had PVCs in the past. I'm not sure this explains her generalized fatigue. TSH was normal in March 2016. Consider decreasing her beta blocker, try a statin holiday.   Time Spent Directly with Patient: 45 minutes  Abelino Derrick [phone] beeper 09/27/2015, 2:34 PM   I have seen and examined the patient along with Corine Shelter, PAI have reviewed the chart, notes and new data.  I agree with PA/NP's note.  Key new complaints: fatigue is her only complaint; no syncope/near-syncope Key examination changes: incessant ectopy, otherwise normal CV exam Key new findings / data: trigeminy and bigeminy with monomorphic PVCs on monitor and ECG  PLAN: Suspect bradycardia was artifactual due to undercounting of PVCs with automatic BP cuff. I do not know if there is any connection between her PVCs and her fatigue (or her beta blocker and her fatigue). Will schedule outpatient arrhythmia monitor and f/u with Dr. Anne Fu.  Thurmon Fair, MD, [facility] [phone] 09/27/2015, 3:03 PM

## 2015-09-27 NOTE — Discharge Instructions (Signed)
If you were given medicines take as directed.  If you are on coumadin or contraceptives realize their levels and effectiveness is altered by many different medicines.  If you have any reaction (rash, tongues swelling, other) to the medicines stop taking and see a physician.    If your blood pressure was elevated in the ER make sure you follow up for management with a primary doctor or return for chest pain, shortness of breath or stroke symptoms.  Please follow up as directed and return to the ER or see a physician for new or worsening symptoms.  Thank you. Filed Vitals:   09/27/15 1118 09/27/15 1200 09/27/15 1245 09/27/15 1330  BP: 127/94 136/90 121/87 130/97  Pulse: 80 80  74  Temp: 97.7 F (36.5 C)     TempSrc: Oral     Resp: 20 18 17 17   SpO2: 97% 100% 98% 99%

## 2015-09-27 NOTE — ED Notes (Signed)
Patient has returned from being out of the department; patient placed back on the monitor, continuous pulse oximetry and blood pressure cuff; visitors at bedside

## 2015-09-27 NOTE — ED Notes (Signed)
Pt comfortable with discharge and follow up instructions. No prescriptions. 

## 2015-09-27 NOTE — ED Provider Notes (Addendum)
CSN: [id number]     Arrival date & time 09/27/15  1101 History   First MD Initiated Contact with Patient 09/27/15 1103     Chief Complaint  Patient presents with  . Shortness of Breath  . Irregular Heart Beat     (Consider location/radiation/quality/duration/timing/severity/associated sxs/prior Treatment) HPI Comments: Pt with DM, lipids, HTN, sleep apnea presents with fatigue and episode of bradycardia.  Currently mild fatigue, no cp or sob. Sxs for the past few weeks.  No new medications or changes.  No infectious sxs. Minimal sxs during low hr.   The history is provided by the patient.    Past Medical History  Diagnosis Date  . H/O: hysterectomy   . Abnormal cardiovascular stress test 09/20/2013    Possibly abnormal nuclear stress at [place]-anteroseptal abnormality interpreted as possible breast attenuation as well, motion artifact. Normal EF. No TID. Watching medically  . Essential hypertension 09/20/2013  . Hyperlipidemia 09/20/2013  . Sleep apnea     uses cpap  . Diabetes mellitus (HCC)     Type 2  . Diabetes (HCC) 09/20/2013  . RA (rheumatoid arthritis) [facility])    Past Surgical History  Procedure Laterality Date  . Total abdominal hysterectomy    . Cardiac catheterization  02/12/2015  . Left heart catheterization with coronary angiogram N/A 02/12/2015    Procedure: LEFT HEART CATHETERIZATION WITH CORONARY ANGIOGRAM;  Surgeon: Lennette Bihari, MD;  Location: [facility] CATH LAB;  Service: Cardiovascular;  Laterality: N/A;   Family History  Problem Relation Age of Onset  . Diabetes Mother    Social History  Substance Use Topics  . Smoking status: Never Smoker   . Smokeless tobacco: Never Used  . Alcohol Use: No   OB History    No data available     Review of Systems  Constitutional: Positive for fatigue. Negative for fever and chills.  HENT: Negative for congestion.   Eyes: Negative for visual disturbance.  Respiratory: Negative for shortness of breath.    Cardiovascular: Negative for chest pain.  Gastrointestinal: Negative for vomiting and abdominal pain.  Genitourinary: Negative for dysuria and flank pain.  Musculoskeletal: Negative for back pain, neck pain and neck stiffness.  Skin: Negative for rash.  Neurological: Negative for light-headedness and headaches.      Allergies  Lisinopril  Home Medications   Prior to Admission medications   Medication Sig Start Date End Date Taking? Authorizing Provider  aspirin EC 81 MG tablet Take 81 mg by mouth daily.   Yes Historical Provider, MD  GLIPIZIDE XL 10 MG 24 hr tablet Take 10 mg by mouth daily.  09/07/13  Yes Historical Provider, MD  hydrochlorothiazide (HYDRODIURIL) 25 MG tablet Take 25 mg by mouth daily.  09/19/13  Yes Historical Provider, MD  isosorbide mononitrate (IMDUR) 30 MG 24 hr tablet Take 30 mg by mouth daily. 12/28/14  Yes Historical Provider, MD  LANTUS SOLOSTAR 100 UNIT/ML SOPN Inject 30 Units into the skin daily.  09/07/13  Yes Historical Provider, MD  metoprolol tartrate (LOPRESSOR) 25 MG tablet Take 1 tablet (25 mg total) by mouth 2 (two) times daily. 10/17/14  Yes Rosalio Macadamia, NP  simvastatin (ZOCOR) 40 MG tablet Take 40 mg by mouth daily. 10/26/14  Yes Historical Provider, MD  ACCU-CHEK AVIVA PLUS test strip 1 each by Other route 2 (two) times daily.  02/09/14   Historical Provider, MD  losartan (COZAAR) 50 MG tablet Take 50 mg by mouth daily.  07/14/13   Historical Provider, MD  nitroGLYCERIN (NITROSTAT) 0.4 MG SL tablet Place 1 tablet (0.4 mg total) under the tongue every 5 (five) minutes as needed. 10/17/14   Rosalio Macadamia, NP  omeprazole (PRILOSEC) 20 MG capsule Take 1 capsule (20 mg total) by mouth daily. Patient taking differently: Take 20 mg by mouth daily as needed. For acid reflux 03/13/15 03/12/16  Rosalio Macadamia, NP  OVER THE COUNTER MEDICATION Apply 1 patch topically daily as needed (back pain). Pain patches    Historical Provider, MD  polyethylene glycol  (MIRALAX / GLYCOLAX) packet Take 17 g by mouth daily as needed for mild constipation, moderate constipation or severe constipation.    Historical Provider, MD  traMADol (ULTRAM) 50 MG tablet Take 50 mg by mouth as directed.  09/19/13   Historical Provider, MD  Vitamin D, Ergocalciferol, (DRISDOL) 50000 UNITS CAPS capsule Take 50,000 Units by mouth every 7 (seven) days.    Historical Provider, MD   BP 130/97 mmHg  Pulse 74  Temp(Src) 97.7 F (36.5 C) (Oral)  Resp 17  SpO2 99% Physical Exam  Constitutional: She appears well-developed and well-nourished.  HENT:  Head: Normocephalic and atraumatic.  Eyes: Conjunctivae are normal. Right eye exhibits no discharge. Left eye exhibits no discharge.  Neck: Normal range of motion. Neck supple. No tracheal deviation present.  Cardiovascular: Normal rate and regular rhythm.   Pulmonary/Chest: Effort normal and breath sounds normal.  Abdominal: Soft. She exhibits no distension. There is no tenderness. There is no guarding.  Musculoskeletal: She exhibits no edema.  Neurological: She is alert.  Skin: Skin is warm. No rash noted.  Psychiatric: She has a normal mood and affect.  Nursing note and vitals reviewed.   ED Course  Procedures (including critical care time) Labs Review Labs Reviewed  BASIC METABOLIC PANEL - Abnormal; Notable for the following:    Glucose, Bld 103 (*)    Creatinine, Ser 1.06 (*)    Calcium 10.5 (*)    GFR calc non Af Amer 49 (*)    GFR calc Af Amer 56 (*)    All other components within normal limits  CBC - Abnormal; Notable for the following:    WBC 3.0 (*)    All other components within normal limits  I-STAT TROPOININ, ED    Imaging Review Dg Chest 2 View  09/27/2015  CLINICAL DATA:  Shortness of breath, dyspnea on exertion for 7 days. EXAM: CHEST  2 VIEW COMPARISON:  02/09/2015 FINDINGS: Mild cardiomegaly without edema or CHF. No effusion or pneumothorax. Minor bibasilar streaky atelectasis versus scarring. Aorta  is atherosclerotic and mildly ectatic. Degenerative changes of the spine. IMPRESSION: cardiomegaly without CHF or pneumonia Bibasilar atelectasis versus scarring. Stable exam. Electronically Signed   By: Judie Petit.  Shick M.D.   On: 09/27/2015 12:14   I have personally reviewed and evaluated these images and lab results as part of my medical decision-making.   EKG Interpretation   Date/Time:  Friday September 27 2015 11:14:36 EDT Ventricular Rate:  76 PR Interval:  170 QRS Duration: 92 QT Interval:  389 QTC Calculation: 437 R Axis:   7 Text Interpretation:  Sinus rhythm Ventricular trigeminy LAE, consider  biatrial enlargement Borderline low voltage, extremity leads Confirmed by  Christiaan Strebeck  MD, Jacier Gladu ([id number]) on 09/27/2015 11:37:40 AM      MDM   Final diagnoses:  Bradycardia  Other fatigue   Patient presents after heart rate episode in the 20s. Patient had minimal symptoms during the event. Discussed with cardiology and will arrange Holter  monitor and outpatient follow-up. Patient had no symptoms or rhythm changes in the ER. Discussed holding metoprolol and discussing further with cardiology. Results and differential diagnosis were discussed with the patient/parent/guardian. Xrays were independently reviewed by myself.  Close follow up outpatient was discussed, comfortable with the plan.   Medications - No data to display  Filed Vitals:   09/27/15 1118 09/27/15 1200 09/27/15 1245 09/27/15 1330  BP: 127/94 136/90 121/87 130/97  Pulse: 80 80  74  Temp: 97.7 F (36.5 C)     TempSrc: Oral     Resp: 20 18 17 17   SpO2: 97% 100% 98% 99%    Final diagnoses:  Bradycardia  Other fatigue       , MD 09/27/15 1450  13/04/16, MD 10/17/15 10/19/15  2878, MD 10/17/15 [id number]

## 2015-09-27 NOTE — ED Notes (Signed)
PT presents from her PCP office via EMS with c/o Dyspnea on Exertion x7days.  Per EMS her heart rate is in 60-70 range at rest, but drops to 20-30 with activity.  She denies pain, no edema, no distress at this time.  Irregular rhythm on monitor.

## 2015-10-01 ENCOUNTER — Telehealth: Payer: Self-pay | Admitting: Cardiology

## 2015-10-01 NOTE — Telephone Encounter (Signed)
Follow up   Pt daughter is calling, waiting on returned call

## 2015-10-01 NOTE — Telephone Encounter (Signed)
Called to speak with Steward Drone as requested RE: concerns of fatigue.  She states she is busy speaking with someone on the other line and will call back.

## 2015-10-01 NOTE — Telephone Encounter (Signed)
Would be OK with me to stop metoprolol and give her diltiazem CD 120 QD.   Donato Schultz, MD

## 2015-10-01 NOTE — Telephone Encounter (Signed)
Spoke with daughter who reports pt has been complaining of fatigue for some time.  She was recently seen in the ED and was having frequent PVCs.  She was ordered to have a cardiac monitor placed which occurred on Friday.  Daughter states they were told pt's Metoprolol was the probable cause of her fatigue and daughter is wanting to know if medication can be changed.  Advised I will review information with Dr Anne Fu however he probably would not would to make a change in her medications until we have the results of her monitor back.  Pt was given a follow up appt at daughter's request for 12/7.  She is aware I will call back with any new orders.

## 2015-10-01 NOTE — Telephone Encounter (Signed)
Left message to c/b.

## 2015-10-01 NOTE — Telephone Encounter (Signed)
New Message  Pt dughter calling to speak w/ RN concerning pt's recent bout of fatigue- since hospital stay. Please call back and discuss.

## 2015-10-02 MED ORDER — ATORVASTATIN CALCIUM 20 MG PO TABS: 20.0000 mg | ORAL_TABLET | Freq: Every day | ORAL | Status: DC

## 2015-10-02 MED ORDER — DILTIAZEM HCL ER COATED BEADS 120 MG PO CP24: 120.0000 mg | ORAL_CAPSULE | Freq: Every day | ORAL | Status: DC

## 2015-10-02 NOTE — Telephone Encounter (Signed)
Follow up   Pt is returning call    

## 2015-10-02 NOTE — Telephone Encounter (Signed)
Left message to call back to discuss medication change. 

## 2015-10-02 NOTE — Telephone Encounter (Signed)
Reviewed information with Steward Drone who states understanding.  RX for Diltiazem CD 120 mg QD will be sent into [facility] at daughter's request and Dr Anne Fu orders.  Daughter will call back if further concerns or issues.

## 2015-10-02 NOTE — Telephone Encounter (Signed)
Reviewed simvastatin and diltiazem interaction with Dr Anne Fu who gave orders for the pt to change simvastatin 40 mg to Atorvastatin 20 mg a day.  Daughter aware.  rx sent into pharmacy.

## 2015-10-10 ENCOUNTER — Encounter: Payer: Self-pay | Admitting: Cardiology

## 2015-10-25 ENCOUNTER — Ambulatory Visit: Payer: Medicare Other | Admitting: Cardiology

## 2015-10-30 ENCOUNTER — Ambulatory Visit: Payer: Medicare Other | Admitting: Cardiology

## 2015-12-02 ENCOUNTER — Ambulatory Visit: Payer: Medicare Other | Admitting: Cardiology

## 2015-12-04 ENCOUNTER — Ambulatory Visit (INDEPENDENT_AMBULATORY_CARE_PROVIDER_SITE_OTHER): Payer: Medicare Other | Admitting: Cardiology

## 2015-12-04 ENCOUNTER — Encounter: Payer: Self-pay | Admitting: Cardiology

## 2015-12-04 VITALS — BP 104/80 | HR 97 | Ht 64.0 in | Wt 198.0 lb

## 2015-12-04 DIAGNOSIS — E785 Hyperlipidemia, unspecified: Secondary | ICD-10-CM

## 2015-12-04 DIAGNOSIS — I10 Essential (primary) hypertension: Secondary | ICD-10-CM

## 2015-12-04 DIAGNOSIS — E119 Type 2 diabetes mellitus without complications: Secondary | ICD-10-CM

## 2015-12-04 DIAGNOSIS — Z9889 Other specified postprocedural states: Secondary | ICD-10-CM

## 2015-12-04 NOTE — Patient Instructions (Signed)
Medication Instructions:  Your physician recommends that you continue on your current medications as directed. Please refer to the Current Medication list given to you today.   Labwork: none  Testing/Procedures: none  Follow-Up: Your physician wants you to follow-up in: 12 months with Dr. Skains. You will receive a reminder letter in the mail two months in advance. If you don't receive a letter, please call our office to schedule the follow-up appointment.   Any Other Special Instructions Will Be Listed Below (If Applicable).     If you need a refill on your cardiac medications before your next appointment, please call your pharmacy.   

## 2015-12-04 NOTE — Progress Notes (Signed)
CARDIOLOGY OFFICE NOTE  Date:  12/04/2015    Kristen Weber Date of Birth: 09-Nov-1934 Medical Record #[id number]  PCP:  Jonetta Osgood, NP  Cardiologist:  Anne Fu       History of Present Illness: Kristen Weber is a 82 y.o. female who presents today for follow-up of coronary artery disease, had cardiac catheterization 01/2015-medical management, moderate diffuse disease.  She has a history of diabetes, hypertension, hyperlipidemia, OA and tremor who underwent nuclear stress test noted in June of 2014 at [facility] interpreted as probably abnormal myocardial perfusion study with a large, mildly severe mid anterior, mid anteroseptal, apical anterior, apical septal and apical nearly completely reversible defect suggestive of probable ischemia or possible attenuation artifact. There is moderate motion artifact, sensitivity, specificity of the study is reduced by the noted artifact. No TID.   In November of 2015 - she was having chest pain - cardiac cath ordered but this was cancelled due to pre op CXR showing pneumonia. Her pneumonia was treated - her chest pain was resolved and the patient and family opted for continued observation with medical management.   Presented back to ER at [facility] with chest pain - refused admission/transfer to [facility]. Seen back in the office and was admitted for cardiac cath - see results below - she is to be managed medically.   Comes back today. She's had some back pain, muscle pain, snoring, nausea, balance issues, leg pain. She dips snuff. No recent CP. No SOB.  Overall she is pleased with how she is doing. Her primary has been monitoring her CBC. She threw one of her pill bottles away and she is unsure of which one that is. She is asking for refills on medications that we give her.  Here with her family member  Past Medical History  Diagnosis Date  . H/O: hysterectomy   . Abnormal cardiovascular stress test 09/20/2013    Possibly abnormal nuclear stress at  [place]-anteroseptal abnormality interpreted as possible breast attenuation as well, motion artifact. Normal EF. No TID. Watching medically  . Essential hypertension 09/20/2013  . Hyperlipidemia 09/20/2013  . Sleep apnea     uses cpap  . Diabetes mellitus (HCC)     Type 2  . Diabetes (HCC) 09/20/2013  . RA (rheumatoid arthritis) [facility])     Past Surgical History  Procedure Laterality Date  . Total abdominal hysterectomy    . Cardiac catheterization  02/12/2015  . Left heart catheterization with coronary angiogram N/A 02/12/2015    Procedure: LEFT HEART CATHETERIZATION WITH CORONARY ANGIOGRAM;  Surgeon: Lennette Bihari, MD;  Location: [facility] CATH LAB;  Service: Cardiovascular;  Laterality: N/A;     Medications: Current Outpatient Prescriptions  Medication Sig Dispense Refill  . ACCU-CHEK AVIVA PLUS test strip 1 each by Other route 2 (two) times daily.     Marland Kitchen aspirin EC 81 MG tablet Take 81 mg by mouth daily.    Marland Kitchen atorvastatin (LIPITOR) 20 MG tablet Take 1 tablet (20 mg total) by mouth daily. 30 tablet 6  . diltiazem (CARDIZEM CD) 120 MG 24 hr capsule Take 1 capsule (120 mg total) by mouth daily. 30 capsule 6  . GLIPIZIDE XL 10 MG 24 hr tablet Take 10 mg by mouth daily.     . hydrochlorothiazide (HYDRODIURIL) 25 MG tablet Take 25 mg by mouth daily.     . isosorbide mononitrate (IMDUR) 30 MG 24 hr tablet Take 30 mg by mouth daily.  3  . LANTUS SOLOSTAR 100 UNIT/ML  SOPN Inject 30 Units into the skin daily.     Marland Kitchen losartan (COZAAR) 50 MG tablet Take 50 mg by mouth daily.     . nitroGLYCERIN (NITROSTAT) 0.4 MG SL tablet Place 1 tablet (0.4 mg total) under the tongue every 5 (five) minutes as needed. 25 tablet 6  . OVER THE COUNTER MEDICATION Apply 1 patch topically daily as needed (back pain). Pain patches    . polyethylene glycol (MIRALAX / GLYCOLAX) packet Take 17 g by mouth daily as needed for mild constipation, moderate constipation or severe constipation.    . traMADol (ULTRAM) 50 MG tablet  Take 50 mg by mouth as directed.      No current facility-administered medications for this visit.    Allergies: Allergies  Allergen Reactions  . Lisinopril Cough    cough    Social History: The patient  reports that she has never smoked. She has never used smokeless tobacco. She reports that she does not drink alcohol or use illicit drugs.   Family History: The patient's family history includes Diabetes in her mother.   Review of Systems: Please see the history of present illness.     All other systems are reviewed and negative.   Physical Exam: VS:  BP 104/80 mmHg  Pulse 97  Ht 5\' 4"  (1.626 m)  Wt 198 lb (89.812 kg)  BMI 33.97 kg/m2  SpO2 96% .  BMI Body mass index is 33.97 kg/(m^2).  Wt Readings from Last 3 Encounters:  12/04/15 198 lb (89.812 kg)  03/13/15 192 lb (87.091 kg)  02/11/15 199 lb 11.8 oz (90.6 kg)    General: Pleasant. Well developed, well nourished and in no acute distress.  HEENT: Normal. Neck: Supple, no JVD, carotid bruits, or masses noted.  Cardiac: Regular rate and rhythm. No murmurs, rubs, or gallops. No edema.  Respiratory:  Lungs are clear to auscultation bilaterally with normal work of breathing.  GI: Soft and nontender.  MS: No deformity or atrophy. Gait and ROM intact. Skin: Warm and dry. Color is normal.  Neuro:  Strength and sensation are intact and no gross focal deficits noted.  Psych: Alert, appropriate and with normal affect.   LABORATORY DATA:  EKG:  EKG is not ordered today..  Lab Results  Component Value Date   WBC 3.0* 09/27/2015   HGB 12.6 09/27/2015   HCT 40.4 09/27/2015   PLT 156 09/27/2015   GLUCOSE 103* 09/27/2015   CHOL 184 10/17/2014   TRIG 311.0* 10/17/2014   HDL 44.80 10/17/2014   LDLDIRECT 64.8 10/17/2014   ALT 18 02/11/2015   AST 23 02/11/2015   NA 138 09/27/2015   K 3.7 09/27/2015   CL 105 09/27/2015   CREATININE 1.06* 09/27/2015   BUN 15 09/27/2015   CO2 27 09/27/2015   TSH 1.270 02/11/2015   INR  1.11 02/11/2015   HGBA1C 6.6* 02/11/2015    BNP (last 3 results)  Recent Labs  02/11/15 1816  BNP 57.1    ProBNP (last 3 results) No results for input(s): PROBNP in the last 8760 hours.   Other Studies Reviewed Today:   Nuclear Cardiology Report From Care Everywhere Dated June 2014  Nuclear images findings: Rest and stress images were reviewed. There was a large size, mild in  severity mid anterior, apical anterior, mid anteroseptal, apical septal  and apical partially reversible defect suggestive of probable ischemia or  possible attenuation. Review of raw data suggest motion artifact. TID  estimate of 0.86. Summed stress score of  13, Summed rest score of 2, and  Summed difference score of 11. Gated imaging revealed no wall motion with  an estimated ejection fraction of greater than 65%. Right ventricle is  noted to be normal in size and function.   CARDIAC CATHETERIZATION     PROCEDURE: Left heart catheterization: coronary angiography, left ventriculography   ANGIOGRAPHY:  Left main: Angiographically short vessel which trifurcated into a large LAD, a diminutive ramus intermediate vessel, and a large left circumflex coronary artery.  LAD: There evidence for mild proximal calcification of the LAD. The LAD gave rise to 2 proximal diagonal vessels. There was scattered 30% mild narrowing in the LAD between the first and second diagonal, and also after the second diagonal vessel. The LAD was large caliber and extended to the left ventricular apex. The vessel gave rise to several septal perforating arteries.   Ramus Intermediate: Very small diminutive vessel that had 80% proximal diffuse stenosis in a vessel that was approximately 1.5 mm.   Left circumflex: Large vessel that gave rise to one major bifurcating obtuse marginal branch. The circumflex was free of significant disease.  Right coronary artery: Moderate sized vessel that had smooth 40% eccentric  proximal narrowing. There was 40-50% narrowing just beyond the acute margin. There was 40-50% mid PDA stenosis.   Left ventriculography revealed normal LV function with an ejection fraction of 55%. There were no significant wall motion abnormalities. There is no evidence for mitral regurgitation.   Total contrast used: 80 cc Omnipaque    IMPRESSION:  Normal LV function with an ejection fraction of approximately 55%.   Mild coronary obstructive disease with evidence for mild coronary calcification of the proximal LAD with segmental 30% tandem proximal to mid LAD stenoses; 80% proximal stenosis in a diminutive ramus intermediate vessel; normal large left circumflex coronary artery; mild 40% proximal, 40-50% stenosis just beyond the acute margin and 40-50% mid PDA stenoses.   RECOMMENDATION:  Medical therapy.  Lennette Bihari, MD, [facility] 02/12/2015 10:30 AM  Assessment/Plan:  1. Chest pain - with known previous abnormal nuclear study - now s/p cath - to manage medically - Imdur.  Continue with her other medicines. Doing well. Secondary prevention. No changes made.  2. DM - stable, medicines reviewed  3. HTN - stable, no significant change. Medications reviewed. She is not dizzy with her current blood pressure 104 systolic. Excellent control. Per Tera Helper, NP  4. HLD - on statin, will be checked by PCP.   Current medicines are reviewed with the patient today.  The patient does not have concerns regarding medicines other than what has been noted above.  The following changes have been made:  See above.  Labs/ tests ordered today include:   No orders of the defined types were placed in this encounter.     Disposition:   FU with Dr. Anne Fu in 12 months.   Patient is agreeable to this plan and will call if any problems develop in the interim.   Signed: Donato Schultz, MD   12/04/2015 11:18 AM  [facility] [address] [place], [place]  [address] Phone: [phone] Fax: [phone]

## 2016-01-28 ENCOUNTER — Other Ambulatory Visit: Payer: Self-pay | Admitting: *Deleted

## 2016-01-28 MED ORDER — ISOSORBIDE MONONITRATE ER 30 MG PO TB24: 30.0000 mg | ORAL_TABLET | Freq: Every day | ORAL | Status: DC

## 2016-01-30 ENCOUNTER — Ambulatory Visit (INDEPENDENT_AMBULATORY_CARE_PROVIDER_SITE_OTHER): Payer: Medicare Other | Admitting: Neurology

## 2016-01-30 ENCOUNTER — Encounter: Payer: Self-pay | Admitting: Neurology

## 2016-01-30 VITALS — BP 132/79 | HR 94 | Ht 64.0 in | Wt 204.6 lb

## 2016-01-30 DIAGNOSIS — R251 Tremor, unspecified: Secondary | ICD-10-CM | POA: Diagnosis not present

## 2016-01-30 DIAGNOSIS — G250 Essential tremor: Secondary | ICD-10-CM | POA: Diagnosis not present

## 2016-01-30 MED ORDER — PRIMIDONE 50 MG PO TABS: 50.0000 mg | ORAL_TABLET | Freq: Every day | ORAL | Status: DC

## 2016-01-30 NOTE — Patient Instructions (Signed)
Remember to drink plenty of fluid, eat healthy meals and do not skip any meals. Try to eat protein with a every meal and eat a healthy snack such as fruit or nuts in between meals. Try to keep a regular sleep-wake schedule and try to exercise daily, particularly in the form of walking, 20-30 minutes a day, if you can.   As far as your medications are concerned, I would like to suggest: Primidone(Mysoline). Start with 1/2 a pill and can increase to a whole pill in 2-3 weeks at bedtime  As far as diagnostic testing: Thyroid level  I would like to see you back in 4 months, sooner if we need to. Please call us with any interim questions, concerns, problems, updates or refill requests.   Our phone number is [phone]. We also have an after hours call service for urgent matters and there is a physician on-call for urgent questions. For any emergencies you know to call 911 or go to the nearest emergency room

## 2016-01-30 NOTE — Progress Notes (Signed)
[facility]    Provider:  Dr Lucia Gaskins Referring Provider: Jonetta Osgood, NP Primary Care Physician:  Jonetta Osgood, NP  CC:  Essential tremor  HPI:  Kristen Weber is a 82 y.o. female here for follow up of tremor. She was diagnosed with essential tremor. She was started on propranolol which worked well. Tremor with action, when drinking a cup or buttoning a blouse or picking up a plate. Slowly progressive and affecting daily life. She has sleep apnea and uses her mask. Not tired during the day. She tried gabapentin in the past and had side effects. No tremor in the head or voice. She doesn't drink a lot of coffee but she eats a lot of chocolate.    Reviewed notes, labs and imaging from outside physicians, which showed:  Reviewed notes from Dr. Hosie Poisson:  03/05/2014: Kristen Weber is a 82 y.o. female here as a follow up for tremor. Returns today with continued tremor and concern over continued pain in bilateral knees. Scheduled to follow up for aquatic therapy for treatment. No other acute concerns at this time.   Initial visit 11/2013: Tremor started around a year ago, seems to be getting worse. Unsure if started unilateral or bilateral. No rest tremor, more of an action/postural tremor. Causing difficulty eating. Shaking of hand when holding cup. Handwriting has gotten messier and smaller. No change in voice. Has some muscle stiffness. Feels she has slowed down, not walking as fast, feels she shuffles some. No known history of REM behavior disorder. No aggravating factors for tremors. No change with caffeine. Does not drink EtOH. No family history of tremor. Memory is sharp. Normal sense of smell.   Has history of DM, HTN and chronic arthritic pain.   Review of Systems: Patient complains of symptoms per HPI as well as the following symptoms:no cp, no sob . Pertinent negatives per HPI. All others negative.   Social History   Social History  . Marital Status: Married   Spouse Name: Greggory Stallion  . Number of Children: 2  . Years of Education: 12   Occupational History  . Retired    Social History Main Topics  . Smoking status: Never Smoker   . Smokeless tobacco: Current User    Types: Snuff  . Alcohol Use: No  . Drug Use: No  . Sexual Activity: Not Currently   Other Topics Concern  . Not on file   Social History Narrative   Patient lives with her husband Greggory Stallion    Patient has 2 children.    Patient is retired.   Patient is right-handed.     Caffeine use: Drinks 1 cup coffee per day       Family History  Problem Relation Age of Onset  . Diabetes Mother     Past Medical History  Diagnosis Date  . H/O: hysterectomy   . Abnormal cardiovascular stress test 09/20/2013    Possibly abnormal nuclear stress at [place]-anteroseptal abnormality interpreted as possible breast attenuation as well, motion artifact. Normal EF. No TID. Watching medically  . Essential hypertension 09/20/2013  . Hyperlipidemia 09/20/2013  . Sleep apnea     uses cpap  . Diabetes mellitus (HCC)     Type 2  . Diabetes (HCC) 09/20/2013  . RA (rheumatoid arthritis) [facility])     Past Surgical History  Procedure Laterality Date  . Total abdominal hysterectomy    . Cardiac catheterization  02/12/2015  . Left heart catheterization with coronary angiogram N/A 02/12/2015  Procedure: LEFT HEART CATHETERIZATION WITH CORONARY ANGIOGRAM;  Surgeon: Lennette Bihari, MD;  Location: [facility] CATH LAB;  Service: Cardiovascular;  Laterality: N/A;    Current Outpatient Prescriptions  Medication Sig Dispense Refill  . ACCU-CHEK AVIVA PLUS test strip 1 each by Other route 2 (two) times daily.     Marland Kitchen aspirin EC 81 MG tablet Take 81 mg by mouth daily.    Marland Kitchen atorvastatin (LIPITOR) 20 MG tablet Take 1 tablet (20 mg total) by mouth daily. 30 tablet 6  . Cholecalciferol (D 1000) 1000 units capsule Take 1,000 Units by mouth daily.    Marland Kitchen diltiazem (CARDIZEM CD) 120 MG 24 hr capsule Take 1 capsule (120 mg  total) by mouth daily. 30 capsule 6  . GLIPIZIDE XL 10 MG 24 hr tablet Take 10 mg by mouth daily.     . hydrochlorothiazide (HYDRODIURIL) 25 MG tablet Take 25 mg by mouth daily.     . isosorbide mononitrate (IMDUR) 30 MG 24 hr tablet Take 1 tablet (30 mg total) by mouth daily. 30 tablet 11  . LANTUS SOLOSTAR 100 UNIT/ML SOPN Inject 30 Units into the skin daily.     Marland Kitchen losartan (COZAAR) 50 MG tablet Take 50 mg by mouth daily.     . nitroGLYCERIN (NITROSTAT) 0.4 MG SL tablet Place 1 tablet (0.4 mg total) under the tongue every 5 (five) minutes as needed. 25 tablet 6  . OVER THE COUNTER MEDICATION Apply 1 patch topically daily as needed (back pain). Pain patches    . polyethylene glycol (MIRALAX / GLYCOLAX) packet Take 17 g by mouth daily as needed for mild constipation, moderate constipation or severe constipation.    . traMADol (ULTRAM) 50 MG tablet Take 50 mg by mouth as directed.     . primidone (MYSOLINE) 50 MG tablet Take 1 tablet (50 mg total) by mouth at bedtime. 30 tablet 12   No current facility-administered medications for this visit.    Allergies as of 01/30/2016 - Review Complete 01/30/2016  Allergen Reaction Noted  . Lisinopril Cough 10/17/2014    Vitals: BP 132/Kristen mmHg  Pulse 94  Ht 5\' 4"  (1.626 m)  Wt 204 lb 9.6 oz (92.806 kg)  BMI 35.10 kg/m2 Last Weight:  Wt Readings from Last 1 Encounters:  01/30/16 204 lb 9.6 oz (92.806 kg)   Last Height:   Ht Readings from Last 1 Encounters:  01/30/16 5\' 4"  (1.626 m)    No change on exam, stable:  Speech: fluent w/o paraphasic error  Memory: good recent and remote recall  CN: PERRL, EOMI no nystagmus, no ptosis, sensation intact to LT V1-V3 bilat, face symmetric, no weakness, hearing grossly intact, palate elevates symmetrically, shoulder shrug 5/5 bilat,  tongue protrudes midline, no fasiculations noted.  Motor: normal bulk and tone Strength: 5/5 In all extremities  Coord: rapid alternating and point-to-point (FNF,  HTS) movements intact. No rest tremor noted. No action or postural tremor noted. Mild right greater left intention tremor of hands. Bradykinesia noted in bilateral hands with finger opening and closing, finger tapping and rapid alternating movements. Bradykinesia with foot tapping.   Reflexes: symmetrical, bilat downgoing toes  Sens: LT intact in all extremities  Gait: Stands slowly without assistance, mildly stooped, shuffling gait, appears antalgic gait favoring right leg, decreased arm swing left greater than right no re emergent tremor.  Assessment and Plan:  1)Essential Tremor: Could not tolerate propranolol. As far as your medications are concerned, I would like to suggest: Primidone(Mysoline). Start with 1/2  a pill and can increase to a whole pill in 2-3 weeks at bedtime  As far as diagnostic testing: Thyroid level  Kristen year old right-handed Weber presenting for followup of tremor, described only as a action/intention tremor. Minimal intention tremor noted on exam but otherwise unremarkable. Tremor appears very mild but patient notes it is negatively impacting her quality of life. Based on history and exam suspect this likely represents a mild essential tremor. Patient does have some bradykinesia on exam but this may represent symptoms of arthritis. The possible diagnosis was discussed extensively with patient and her daughter. Expressed understanding.    Naomie Dean, MD  [facility] [address] [place], [place] [address]  Phone [phone] Fax [phone]  A total of 30 minutes was spent face-to-face with this patient. Over half this time was spent on counseling patient on the essential tremor diagnosis and different diagnostic and therapeutic options available.

## 2016-01-31 LAB — THYROID PANEL WITH TSH
Free Thyroxine Index: 1.9 (ref 1.2–4.9)
T3 Uptake Ratio: 30 % (ref 24–39)
T4, Total: 6.4 ug/dL (ref 4.5–12.0)
TSH: 1.83 u[IU]/mL (ref 0.450–4.500)

## 2016-02-02 DIAGNOSIS — G250 Essential tremor: Secondary | ICD-10-CM | POA: Insufficient documentation

## 2016-02-03 ENCOUNTER — Telehealth: Payer: Self-pay | Admitting: *Deleted

## 2016-02-03 NOTE — Telephone Encounter (Signed)
LVM for pt to call about results. Gave [facility] phone number.  Ok to inform pt/daughter labs normal per Dr Lucia Gaskins.

## 2016-02-03 NOTE — Telephone Encounter (Signed)
-----   Message from Anson Fret, MD sent at 01/31/2016  1:59 PM EST ----- Thyroid panel normal thanks

## 2016-02-06 NOTE — Telephone Encounter (Signed)
Dr Lucia Gaskins- please advise Called pt. Advised thyroid normal per Dr Lucia Gaskins. She verbalized understanding. She stated the primidone (1/2 tablet) makes her too tired. She takes at bedtime as instructed. She cannot take this she stated. Advised I will speak to Dr Lucia Gaskins and if she wants to do anything different, I will call her back. She verbalized understanding.

## 2016-02-10 NOTE — Telephone Encounter (Signed)
I tried calling. Someone said she was not there and hung up on me. Please call and discuss with her. She has failed the best two medications for essential tremor. She did not tolerate the propranolol and now the primidone. Have a talk with her and see how tired she was on the primidone. Explain that all medications have side effects especially the ones we give for her kind of tremor. Ask how long she tried the primidone and if it was mild fatigue or if the side effects had a potential for serious sequelae like falls. If she was mildly tired and she didn't give the primidone a good try, then I would encourage her to try it for at least 4 weeks and see if it gets better. If she really had significant side effects then absolutely have her stop. Next we could try Neurontin which also has side effects of course. Thanks.

## 2016-02-11 NOTE — Telephone Encounter (Signed)
LVM for pt to call office back. Gave [facility] phone number.  

## 2016-02-11 NOTE — Telephone Encounter (Signed)
LVM returning call. Gave [facility] phone number

## 2016-02-11 NOTE — Telephone Encounter (Signed)
Dr Lucia Gaskins- please advise, thank you Called pt back. Relayed Dr Lucia Gaskins message below. Pt stated "primidone made me feel really off balance and I was staggering. I felt like I was going to fall. I do not feel safe taking this". Advised next steps would be possibly to try a medication called Neurontin. I will speak to Dr Lucia Gaskins once she is back in the office and call her back within the next couple days to let her know what the next steps will be. Pt verbalized understanding.

## 2016-02-11 NOTE — Telephone Encounter (Signed)
Patient returned your call and states she will be at home all afternoon for your callback.  Thanks!

## 2016-02-11 NOTE — Telephone Encounter (Signed)
Pt returned Emma's call °

## 2016-02-12 ENCOUNTER — Other Ambulatory Visit: Payer: Self-pay | Admitting: Neurology

## 2016-02-12 DIAGNOSIS — G250 Essential tremor: Secondary | ICD-10-CM

## 2016-02-12 MED ORDER — GABAPENTIN 100 MG PO CAPS: 100.0000 mg | ORAL_CAPSULE | Freq: Three times a day (TID) | ORAL | Status: DC

## 2016-02-12 NOTE — Telephone Encounter (Signed)
I prescribed neurontin 100mg  three times a day. Side effects can include: dizziness, drowsiness, weakness, tired feeling, nausea, diarrhea, constipation, blurred vision, headache, breast swelling, dry mouth, loss of balance or coordination. Stop for anything concenring. Please let her know thanks.

## 2016-02-12 NOTE — Telephone Encounter (Signed)
LVM for pt to call back. Gave [facility] phone number.  

## 2016-02-12 NOTE — Telephone Encounter (Signed)
I called, left a message. Will try again this week.

## 2016-02-12 NOTE — Telephone Encounter (Addendum)
Dr Lucia Gaskins- please advise  Called pt back again. Relayed Dr Lucia Gaskins message. She stated she has tried Neurontin before after looking at medications she had at home. Her PCP gave this to her and she did not tolerate it. She said it caused vision problems and "I do not want to take that medication again". Advised I will have to speak to Dr Lucia Gaskins about next steps. I will call her back to advise after I speak to Dr Lucia Gaskins. She verbalized understanding.

## 2016-02-13 ENCOUNTER — Other Ambulatory Visit: Payer: Self-pay | Admitting: Neurology

## 2016-02-13 MED ORDER — TOPIRAMATE 25 MG PO TABS: 25.0000 mg | ORAL_TABLET | Freq: Every day | ORAL | Status: DC

## 2016-02-13 NOTE — Telephone Encounter (Signed)
Spoke to patient. She has tried propranol, neurontin and primidone all with side effects. Unfortunately those are the best medications for essential tremor. Can try Topamax for essential tremor.   Discussed side effects. Serious side effects can inclue: Abdominal or stomach pain ,fever, chills, or sore throat , lessening of sensations or perception ,loss of appetite , mood or mental changes, including aggression, agitation, apathy, irritability, and mental depression , red, irritated, or bleeding gums , weight loss Rare Blood in the urine , decrease in sexual performance or desire , difficult or painful urination , frequent urination , hearing loss , loss of bladder control , lower back or side pain , nosebleeds , pale skin , red or irritated eyes , ringing or buzzing in the ears , skin rash or itching , swelling , trouble breathing, Common side effects of Topamax include:  tiredness, drowsiness, dizziness, nervousness, numbness or tingly feeling, coordination problems, diarrhea, weight loss, speech/language problems, changes in vision, sensory distortion, loss of appetite, bad taste in your mouth, confusion, slowed thinking, trouble concentrating or paying attention, memory problems,

## 2016-06-01 ENCOUNTER — Ambulatory Visit: Payer: Medicare Other | Admitting: Neurology

## 2016-06-02 ENCOUNTER — Encounter: Payer: Self-pay | Admitting: Neurology

## 2017-02-01 ENCOUNTER — Other Ambulatory Visit: Payer: Self-pay

## 2017-02-01 MED ORDER — ISOSORBIDE MONONITRATE ER 30 MG PO TB24: 30.0000 mg | ORAL_TABLET | Freq: Every day | ORAL | 1 refills | Status: DC

## 2017-03-02 ENCOUNTER — Telehealth: Payer: Self-pay | Admitting: Neurology

## 2017-03-02 NOTE — Telephone Encounter (Signed)
Patient scheduled to see Darrol Angel 03/04/17.  Patient sees Dr. Lucia Gaskins.

## 2017-03-04 ENCOUNTER — Encounter (INDEPENDENT_AMBULATORY_CARE_PROVIDER_SITE_OTHER): Payer: Self-pay

## 2017-03-04 ENCOUNTER — Encounter: Payer: Self-pay | Admitting: Nurse Practitioner

## 2017-03-04 ENCOUNTER — Ambulatory Visit (INDEPENDENT_AMBULATORY_CARE_PROVIDER_SITE_OTHER): Payer: Medicare Other | Admitting: Nurse Practitioner

## 2017-03-04 VITALS — BP 117/84 | HR 95 | Wt 202.0 lb

## 2017-03-04 DIAGNOSIS — R209 Unspecified disturbances of skin sensation: Secondary | ICD-10-CM | POA: Diagnosis not present

## 2017-03-04 DIAGNOSIS — G250 Essential tremor: Secondary | ICD-10-CM | POA: Diagnosis not present

## 2017-03-04 DIAGNOSIS — IMO0001 Reserved for inherently not codable concepts without codable children: Secondary | ICD-10-CM

## 2017-03-04 MED ORDER — TOPIRAMATE 25 MG PO TABS: 25.0000 mg | ORAL_TABLET | Freq: Every day | ORAL | 11 refills | Status: DC

## 2017-03-04 NOTE — Progress Notes (Signed)
[facility]  PATIENT: Kristen Weber DOB: [birth year]   REASON FOR VISIT: Follow-up for essential tremor, new complaint of paresthesias in the feet HISTORY FROM: Patient and daughter Steward Drone    HISTORY OF PRESENT ILLNESS:UPDATE 04/12/2018CM Kristen Weber, 83 year old female returns for follow-up with history of essential tremor.She has tried propranol, neurontin and primidone all with side effects. Unfortunately those are the best medications for essential tremor. She was started on  Topamax for essential tremor at her last visit with Dr. Lucia Gaskins. She has been off the medication for several months. Her tremor has worsened. In addition she has no complaint of paresthesias in the feet. She is diabetic. No recent falls. History of arthritis. She returns for reevaluation TSH  level I.8 01/30/2016  01/30/16 Kristen Weber is a 83 y.o. female here for follow up of tremor. She was diagnosed with essential tremor. She was started on propranolol which worked well. Tremor with action, when drinking a cup or buttoning a blouse or picking up a plate. Slowly progressive and affecting daily life. She has sleep apnea and uses her mask. Not tired during the day. She tried gabapentin in the past and had side effects. No tremor in the head or voice. She doesn't drink a lot of coffee but she eats a lot of chocolate.    REVIEW OF SYSTEMS: Full 14 system review of systems performed and notable only for those listed, all others are neg:  Constitutional: neg  Cardiovascular: neg Ear/Nose/Throat: neg  Skin: neg Eyes: neg Respiratory: neg Gastroitestinal: neg  Hematology/Lymphatic: neg  Endocrine: Intolerance to heat and cold Musculoskeletal:neg Allergy/Immunology: neg Neurological: Tremors, weakness, paresthesias in the feet Psychiatric: neg Sleep : neg   ALLERGIES: Allergies  Allergen Reactions  . Lisinopril Cough    cough    HOME MEDICATIONS: Outpatient Medications Prior to Visit    Medication Sig Dispense Refill  . ACCU-CHEK AVIVA PLUS test strip 1 each by Other route 2 (two) times daily.     Marland Kitchen aspirin EC 81 MG tablet Take 81 mg by mouth daily.    Marland Kitchen atorvastatin (LIPITOR) 20 MG tablet Take 1 tablet (20 mg total) by mouth daily. 30 tablet 6  . Cholecalciferol (D 1000) 1000 units capsule Take 1,000 Units by mouth daily.    Marland Kitchen diltiazem (CARDIZEM CD) 120 MG 24 hr capsule Take 1 capsule (120 mg total) by mouth daily. 30 capsule 6  . GLIPIZIDE XL 10 MG 24 hr tablet Take 10 mg by mouth daily.     . hydrochlorothiazide (HYDRODIURIL) 25 MG tablet Take 25 mg by mouth daily.     . isosorbide mononitrate (IMDUR) 30 MG 24 hr tablet Take 1 tablet (30 mg total) by mouth daily. 30 tablet 1  . LANTUS SOLOSTAR 100 UNIT/ML SOPN Inject 30 Units into the skin daily.     Marland Kitchen losartan (COZAAR) 50 MG tablet Take 50 mg by mouth daily.     Marland Kitchen OVER THE COUNTER MEDICATION Apply 1 patch topically daily as needed (back pain). Pain patches    . polyethylene glycol (MIRALAX / GLYCOLAX) packet Take 17 g by mouth daily as needed for mild constipation, moderate constipation or severe constipation.    . topiramate (TOPAMAX) 25 MG tablet Take 1 tablet (25 mg total) by mouth at bedtime. 30 tablet 11  . traMADol (ULTRAM) 50 MG tablet Take 50 mg by mouth as directed.     . nitroGLYCERIN (NITROSTAT) 0.4 MG SL tablet Place 1 tablet (0.4 mg total) under the tongue  every 5 (five) minutes as needed. (Patient not taking: Reported on 03/04/2017) 25 tablet 6   No facility-administered medications prior to visit.     PAST MEDICAL HISTORY: Past Medical History:  Diagnosis Date  . Abnormal cardiovascular stress test 09/20/2013   Possibly abnormal nuclear stress at [place]-anteroseptal abnormality interpreted as possible breast attenuation as well, motion artifact. Normal EF. No TID. Watching medically  . Diabetes (HCC) 09/20/2013  . Diabetes mellitus (HCC)    Type 2  . Essential hypertension 09/20/2013  . H/O:  hysterectomy   . Hyperlipidemia 09/20/2013  . RA (rheumatoid arthritis) (HCC)   . Sleep apnea    uses cpap    PAST SURGICAL HISTORY: Past Surgical History:  Procedure Laterality Date  . CARDIAC CATHETERIZATION  02/12/2015  . LEFT HEART CATHETERIZATION WITH CORONARY ANGIOGRAM N/A 02/12/2015   Procedure: LEFT HEART CATHETERIZATION WITH CORONARY ANGIOGRAM;  Surgeon: Lennette Bihari, MD;  Location: [facility] CATH LAB;  Service: Cardiovascular;  Laterality: N/A;  . TOTAL ABDOMINAL HYSTERECTOMY      FAMILY HISTORY: Family History  Problem Relation Age of Onset  . Diabetes Mother     SOCIAL HISTORY: Social History   Social History  . Marital status: Widowed    Spouse name: Greggory Stallion  . Number of children: 2  . Years of education: 12   Occupational History  . Retired    Social History Main Topics  . Smoking status: Never Smoker  . Smokeless tobacco: Current User    Types: Snuff  . Alcohol use No  . Drug use: No  . Sexual activity: Not Currently   Other Topics Concern  . Not on file   Social History Narrative   Patient lives alone 03/04/17   Patient has 2 children.    Patient is retired.   Patient is right-handed.     Caffeine use: Drinks 1 cup coffee per day        PHYSICAL EXAM  Vitals:   03/04/17 1404  BP: 117/84  Pulse: 95  Weight: 202 lb (91.6 kg)   Body mass index is 34.67 kg/m.  Generalized: Well developed, Obese female in no acute distress  Head: normocephalic and atraumatic,. Oropharynx benign  Neck: Supple, no carotid bruits  Cardiac: Regular rate rhythm, no murmur  Musculoskeletal: No deformity   Neurological examination   Mentation: Alert oriented to time, place, history taking. Attention span and concentration appropriate. Recent and remote memory intact.  Follows all commands speech and language fluent.   Cranial nerve II-XII: .Pupils were equal round reactive to light extraocular movements were full, visual field were full on confrontational test.  Facial sensation and strength were normal. hearing was intact to finger rubbing bilaterally. Uvula tongue midline. head turning and shoulder shrug were normal and symmetric.Tongue protrusion into cheek strength was normal. Motor: normal bulk and tone, full strength in the BUE, BLE, fine finger movements normal, no pronator drift. No focal weakness Sensory: normal and symmetric to light touch, pinprick, and  Vibration, in the upper and lower extremities  Coordination: finger-nose-finger, heel-to-shin bilaterally, no dysmetria Reflexes: Symmetric upper and lower, plantar responses were flexor bilaterally. Gait and Station: Rising up from seated position slowly without assistance, mildly stooped shuffling gait favoring the right leg , ambulates with single-point cane  DIAGNOSTIC DATA (LABS, IMAGING, TESTING) - I reviewed patient records, labs, notes, testing and imaging myself where available.      Lab Results  Component Value Date   TSH 1.830 01/30/2016      ASSESSMENT  AND PLAN  83 y.o. year old female  has a past medical history of  Diabetes mellitus (HCC); Essential hypertension (09/20/2013);  Hyperlipidemia (09/20/2013); RA (rheumatoid arthritis) (HCC); and Sleep apnea.To follow-up for essential tremor . New complaint of paresthesias of both feet      PLAN: Restart Topamax taking 25 mg at bedtime and may  titrate further if needed  Obtain EMG nerve conduction for burning paresthesias, probably related to diabetes  Follow-up with me in 6 months I spent 25 minutes in total face to face time with the patient more than 50% of which was spent counseling and coordination of care, reviewing test results reviewing medications and discussing and reviewing the diagnosis of ET and further treatment options. As well as purpose of  EMG nerve conduction study . Daughter and patient verbalized understanding, Nilda Riggs, [facility], [facility], APRN  [facility] [address],  [address] [place], [place] [address] [phone]

## 2017-03-04 NOTE — Patient Instructions (Signed)
Restart Topamax taking 25 mg at bedtime Obtain EMG nerve conduction for burning paresthesias Follow-up with me in 6 months

## 2017-03-22 ENCOUNTER — Telehealth: Payer: Self-pay | Admitting: Neurology

## 2017-03-22 NOTE — Telephone Encounter (Signed)
Pt saw Eber Jones for OV on 03/04/17. She has essential tremor for which she's tried propranolol, gabapentin and primidone with side effects. Hx of diabetes and arthritis. C/o burning/numbness in her feet. She was restarted on Topamax 25 mg at bedtime and is scheduled for NCV/EMG on 04/15/17.

## 2017-03-22 NOTE — Telephone Encounter (Signed)
Pt daughter calling  Stating pt legs are bothering her so bad to the point she is hardly able to walk.  Pt daughter aware of date of NCV & EMG, would just like to know of any suggestions to help her mother with this discomfort

## 2017-03-23 NOTE — Telephone Encounter (Signed)
Returned call and spoke to pt's daughter. Advised that she can increase topiramate to 50 mg a day if she's tolerating the 25 mg. Pt is already taking tramadol as well as Tylenol for pain. Will discuss further at EMG/NCV. May call back w/ any additional concerns before then.

## 2017-03-23 NOTE — Telephone Encounter (Signed)
We can increase the Topiramate to 25mg  twice daily. Unfortunately she has failed most of the first-line medications for essential tremor (primidone, propranolol and gabapentin). Please let her know the other medications are not as effective as the ones she already tried and we can discuss at emg/ncs.

## 2017-03-31 ENCOUNTER — Ambulatory Visit: Payer: Medicare Other | Admitting: Cardiology

## 2017-04-01 NOTE — Progress Notes (Signed)
Personally  participated in, made any corrections needed, and agree with history, physical, neuro exam,assessment and plan as stated above.    Antonia Ahern, MD [facility] 

## 2017-04-02 ENCOUNTER — Encounter: Payer: Self-pay | Admitting: Cardiology

## 2017-04-05 ENCOUNTER — Other Ambulatory Visit: Payer: Self-pay | Admitting: Cardiology

## 2017-04-15 ENCOUNTER — Encounter: Payer: Medicare Other | Admitting: Neurology

## 2017-09-20 NOTE — Progress Notes (Signed)
Cardiology Office Note    Date:  09/21/2017   ID:  Kristen Weber, DOB [birth year], MRN [id number]  PCP:  Tacey Ruiz, FNP  Cardiologist:  Dr. Anne Fu   Chief Complaint: 18 months follow up for CAD  History of Present Illness:   Kristen Weber is a 83 y.o. female CAD (medical management), hypertension, hyperlipidemia, diabetes, OSA and RA presents for follow up.  Underwent nuclear stress test noted in June of 2014 at [facility] interpreted as probably abnormal myocardial perfusion study with a large, mildly severe mid anterior, mid anteroseptal, apical anterior, apical septal and apical nearly completely reversible defect suggestive of probable ischemia or possible attenuation artifact. There is moderate motion artifact, sensitivity, specificity of the study is reduced by the noted artifact. No TID. Cath 01/2015 for chest pain showed Mild coronary obstructive disease with evidence for mild coronary calcification of the proximal LAD with segmental 30% tandem proximal to mid LAD stenoses; 80% proximal stenosis in a diminutive ramus intermediate vessel; normal large left circumflex coronary artery; mild 40% proximal, 40-50% stenosis just beyond the acute margin and 40-50% mid PDA stenoses.   She was doing well on cardiac stand point when last seen by Dr. Anne Fu 11/2015.  Patient evaluated by primary care provider October 5 for routine follow-up.  She complained of shortness of breath and chest tightness with and without exertion.  Symptoms lasted for a few minutes with self resolution.  Advised to follow-up with regular cardiologist.  Today she states that her chest tightness and shortness of breath has been intermittent and stable for past 4 weeks.  She did not require any sublingual nitroglycerin.  She denies orthopnea, PND, syncope, lower extremity edema, vomiting or dizziness, palpitation compliant with medication.  Compliant with medication.  Her symptoms are similar to prior angina when she had a  cath in 2016.  Her daughter is present during office visit.   Past Medical History:  Diagnosis Date  . Abnormal cardiovascular stress test 09/20/2013   Possibly abnormal nuclear stress at [place]-anteroseptal abnormality interpreted as possible breast attenuation as well, motion artifact. Normal EF. No TID. Watching medically  . Diabetes (HCC) 09/20/2013  . Diabetes mellitus (HCC)    Type 2  . Essential hypertension 09/20/2013  . H/O: hysterectomy   . Hyperlipidemia 09/20/2013  . RA (rheumatoid arthritis) (HCC)   . Sleep apnea    uses cpap    Past Surgical History:  Procedure Laterality Date  . CARDIAC CATHETERIZATION  02/12/2015  . LEFT HEART CATHETERIZATION WITH CORONARY ANGIOGRAM N/A 02/12/2015   Procedure: LEFT HEART CATHETERIZATION WITH CORONARY ANGIOGRAM;  Surgeon: Lennette Bihari, MD;  Location: [facility] CATH LAB;  Service: Cardiovascular;  Laterality: N/A;  . TOTAL ABDOMINAL HYSTERECTOMY      Current Medications:  Prior to Admission medications   Medication Sig Start Date End Date Taking? Authorizing Provider  ACCU-CHEK AVIVA PLUS test strip 1 each by Other route 2 (two) times daily.  02/09/14  Yes [provider]  aspirin EC 81 MG tablet Take 81 mg by mouth daily.   Yes [provider]  atorvastatin (LIPITOR) 20 MG tablet Take 1 tablet (20 mg total) by mouth daily. 10/02/15  Yes Jake Bathe, MD  diltiazem (CARDIZEM CD) 120 MG 24 hr capsule Take 1 capsule (120 mg total) by mouth daily. 10/02/15  Yes Jake Bathe, MD  GLIPIZIDE XL 10 MG 24 hr tablet Take 10 mg by mouth daily.  09/07/13  Yes [provider]  hydrochlorothiazide (HYDRODIURIL) 25  MG tablet Take 25 mg by mouth daily.  09/19/13  Yes [provider]  isosorbide mononitrate (IMDUR) 30 MG 24 hr tablet TAKE ONE TABLET BY MOUTH DAILY 04/06/17  Yes Jake Bathe, MD  LANTUS SOLOSTAR 100 UNIT/ML SOPN Inject 30 Units into the skin daily.  09/07/13  Yes [provider]  losartan  (COZAAR) 50 MG tablet Take 50 mg by mouth daily.  07/14/13  Yes [provider]  LYRICA 100 MG capsule Take 100 mg by mouth 2 (two) times daily. 09/15/17  Yes [provider]  nitroGLYCERIN (NITROSTAT) 0.4 MG SL tablet Place 1 tablet (0.4 mg total) under the tongue every 5 (five) minutes as needed. 10/17/14  Yes Rosalio Macadamia, NP  Omega-3 Fatty Acids (FISH OIL PO) Take 1,300 mg by mouth daily.   Yes [provider]  OVER THE COUNTER MEDICATION Apply 1 patch topically daily as needed (back pain). Pain patches   Yes [provider]  polyethylene glycol (MIRALAX / GLYCOLAX) packet Take 17 g by mouth daily as needed for mild constipation, moderate constipation or severe constipation.   Yes [provider]  topiramate (TOPAMAX) 25 MG tablet Take 1 tablet (25 mg total) by mouth at bedtime. 03/04/17  Yes Nilda Riggs, NP  traMADol (ULTRAM) 50 MG tablet Take 50 mg by mouth as directed.  09/19/13  Yes [provider]  Allergies:   Lisinopril   Social History   Social History  . Marital status: Widowed    Spouse name: Greggory Stallion  . Number of children: 2  . Years of education: 12   Occupational History  . Retired    Social History Main Topics  . Smoking status: Never Smoker  . Smokeless tobacco: Current User    Types: Snuff  . Alcohol use No  . Drug use: No  . Sexual activity: Not Currently   Other Topics Concern  . None   Social History Narrative   Patient lives alone 03/04/17   Patient has 2 children.    Patient is retired.   Patient is right-handed.     Caffeine use: Drinks 1 cup coffee per day        Family History:  The patient's family history includes Diabetes in her mother.  ROS:   Please see the history of present illness.    ROS All other systems reviewed and are negative.   PHYSICAL EXAM:   VS:  BP 124/86   Pulse 72   Ht 5\' 4"  (1.626 m)   Wt 197 lb (89.4 kg)   BMI 33.81 kg/m    GEN: Well nourished, well  developed, in no acute distress  HEENT: normal  Neck: no JVD, carotid bruits, or masses Cardiac: RRR; no murmurs, rubs, or gallops,no edema  Respiratory:  clear to auscultation bilaterally, normal work of breathing GI: soft, nontender, nondistended, + BS MS: no deformity or atrophy  Skin: warm and dry, no rash Neuro:  Alert and Oriented x 3, Strength and sensation are intact Psych: euthymic mood, full affect  Wt Readings from Last 3 Encounters:  09/21/17 197 lb (89.4 kg)  03/04/17 202 lb (91.6 kg)  01/30/16 204 lb 9.6 oz (92.8 kg)      Studies/Labs Reviewed:   EKG:  EKG is ordered today.  The ekg ordered today demonstrates normal sinus rhythm with PAC.  Recent Labs: No results found for requested labs within last 8760 hours.   Lipid Panel    Component Value Date/Time  CHOL 184 10/17/2014 0946   TRIG 311.0 (H) 10/17/2014 0946   HDL 44.80 10/17/2014 0946   CHOLHDL 4 10/17/2014 0946   VLDL 62.2 (H) 10/17/2014 0946   LDLDIRECT 64.8 10/17/2014 0946    Additional studies/ records that were reviewed today include:   CARDIAC CATHETERIZATION 02/12/15   PROCEDURE: Left heart catheterization: coronary angiography, left ventriculography   ANGIOGRAPHY:  Left main: Angiographically short vessel which trifurcated into a large LAD, a diminutive ramus intermediate vessel, and a large left circumflex coronary artery.  LAD: There evidence for mild proximal calcification of the LAD. The LAD gave rise to 2 proximal diagonal vessels. There was scattered 30% mild narrowing in the LAD between the first and second diagonal, and also after the second diagonal vessel. The LAD was large caliber and extended to the left ventricular apex. The vessel gave rise to several septal perforating arteries.   Ramus Intermediate: Very small diminutive vessel that had 80% proximal diffuse stenosis in a vessel that was approximately 1.5 mm.   Left circumflex: Large vessel that gave rise to  one major bifurcating obtuse marginal branch. The circumflex was free of significant disease.  Right coronary artery: Moderate sized vessel that had smooth 40% eccentric proximal narrowing. There was 40-50% narrowing just beyond the acute margin. There was 40-50% mid PDA stenosis.   Left ventriculography revealed normal LV function with an ejection fraction of 55%. There were no significant wall motion abnormalities. There is no evidence for mitral regurgitation.   Total contrast used: 80 cc Omnipaque    IMPRESSION:  Normal LV function with an ejection fraction of approximately 55%.   Mild coronary obstructive disease with evidence for mild coronary calcification of the proximal LAD with segmental 30% tandem proximal to mid LAD stenoses; 80% proximal stenosis in a diminutive ramus intermediate vessel; normal large left circumflex coronary artery; mild 40% proximal, 40-50% stenosis just beyond the acute margin and 40-50% mid PDA stenoses.   RECOMMENDATION:  Medical therapy.  Lennette Bihari, MD, [facility] 02/12/2015 10:30 AM    ASSESSMENT & PLAN:    1. Unstable angina  - Cath 01/2015 showed mild CAD as noted above. Recommended medical therapy.  -Her symptoms for the past 4 weeks concerning for unstable angina. However did not required any nitro. Symptoms similar to prior angina.  Discussed cardiac catheterization for further evaluation however, patient wants to defer this for now.  Will increase Imdur to 60 mg daily. Continue ASA and statin.   2. HTN -Stable and well controlled on current regimen.  3. HLD - LDL 42 on 05/12/17 in Care Everywhere.   4.  Diabetes -Per PCP.  Medication Adjustments/Labs and Tests Ordered: Current medicines are reviewed at length with the patient today.  Concerns regarding medicines are outlined above.  Medication changes, Labs and Tests ordered today are listed in the Patient Instructions below. Patient Instructions  Medication  Instructions:   Your physician has recommended you make the following change in your medication:   Increase isosorbide mononitrate 60 mg daily. You may take (2) of your 30 mg tablets daily until they are finished.   Continue all other medications the same.  Labwork:  NONE  Testing/Procedures:  NONE  Follow-Up:  Your physician recommends that you schedule a follow-up appointment in: 3 weeks with an extender.  Any Other Special Instructions Will Be Listed Below (If Applicable).  If you need a refill on your cardiac medications before your next appointment, please call your pharmacy.    Signed, Mariana Goytia,  PA  09/21/2017 10:49 AM    [facility] [address] [place], [place], [place]  [address] Phone: [phone]; Fax: [phone]

## 2017-09-21 ENCOUNTER — Encounter: Payer: Self-pay | Admitting: Physician Assistant

## 2017-09-21 ENCOUNTER — Ambulatory Visit (INDEPENDENT_AMBULATORY_CARE_PROVIDER_SITE_OTHER): Payer: Medicare Other | Admitting: Physician Assistant

## 2017-09-21 VITALS — BP 124/86 | HR 72 | Ht 64.0 in | Wt 197.0 lb

## 2017-09-21 DIAGNOSIS — E782 Mixed hyperlipidemia: Secondary | ICD-10-CM | POA: Diagnosis not present

## 2017-09-21 DIAGNOSIS — E119 Type 2 diabetes mellitus without complications: Secondary | ICD-10-CM | POA: Diagnosis not present

## 2017-09-21 DIAGNOSIS — I10 Essential (primary) hypertension: Secondary | ICD-10-CM | POA: Diagnosis not present

## 2017-09-21 DIAGNOSIS — I200 Unstable angina: Secondary | ICD-10-CM

## 2017-09-21 MED ORDER — ISOSORBIDE MONONITRATE ER 60 MG PO TB24: 60.0000 mg | ORAL_TABLET | Freq: Every day | ORAL | 3 refills | Status: DC

## 2017-09-21 NOTE — Patient Instructions (Addendum)
Medication Instructions:   Your physician has recommended you make the following change in your medication:   Increase isosorbide mononitrate 60 mg daily. You may take (2) of your 30 mg tablets daily until they are finished.   Continue all other medications the same.  Labwork:  NONE  Testing/Procedures:  NONE  Follow-Up:  Your physician recommends that you schedule a follow-up appointment in: 3 weeks with an extender.  Any Other Special Instructions Will Be Listed Below (If Applicable).  If you need a refill on your cardiac medications before your next appointment, please call your pharmacy.

## 2017-10-19 ENCOUNTER — Ambulatory Visit: Payer: Medicare Other | Admitting: Nurse Practitioner

## 2017-10-20 ENCOUNTER — Encounter: Payer: Self-pay | Admitting: Cardiology

## 2017-10-20 ENCOUNTER — Ambulatory Visit (INDEPENDENT_AMBULATORY_CARE_PROVIDER_SITE_OTHER): Payer: Medicare Other | Admitting: Cardiology

## 2017-10-20 VITALS — BP 100/62 | HR 91 | Resp 16 | Ht 63.0 in | Wt 202.4 lb

## 2017-10-20 DIAGNOSIS — I2510 Atherosclerotic heart disease of native coronary artery without angina pectoris: Secondary | ICD-10-CM | POA: Diagnosis not present

## 2017-10-20 DIAGNOSIS — I209 Angina pectoris, unspecified: Secondary | ICD-10-CM | POA: Diagnosis not present

## 2017-10-20 DIAGNOSIS — I10 Essential (primary) hypertension: Secondary | ICD-10-CM | POA: Diagnosis not present

## 2017-10-20 NOTE — Patient Instructions (Signed)
Your physician recommends that you continue on your current medications as directed. Please refer to the Current Medication list given to you today.  Your physician recommends that you schedule a follow-up appointment in: 3 MONTHS WITH DR Caro Hight

## 2017-10-20 NOTE — Progress Notes (Signed)
Cardiology Office Note   Date:  10/20/2017   ID:  Kristen Weber, DOB 24-May-1934, MRN [id number]  PCP:  Tacey Ruiz, FNP  Cardiologist:  DrAnne Fu    Chief Complaint  Patient presents with  . Chest Pain    medication change       History of Present Illness: Kristen Weber is a 83 y.o. female who presents for SOB and chest tightness.  Last seen 09/21/17 she did not wish cardiac cath so imdur increased and she continued ASA and statin,   She has a hx of undergoing nuclear stress test June of 2014 at [facility] interpreted as probably abnormal myocardial perfusion study with a large, mildly severe mid anterior, mid anteroseptal, apical anterior, apical septal and apical nearly completely reversible defect suggestive of probable ischemia or possible attenuation artifact. There is moderate motion artifact, sensitivity, specificity of the study is reduced by the noted artifact. No TID. Cath 01/2015 for chest pain showed Mild coronary obstructive disease with evidence for mild coronary calcification of the proximal LAD with segmental 30% tandem proximal to mid LAD stenoses; 80% proximal stenosis in a diminutive ramus intermediate vessel; normal large left circumflex coronary artery; mild 40% proximal, 40-50% stenosis just beyond the acute margin and 40-50% mid PDA stenoses.   Today on medications she has had no further chest pain or SOB.  She has been feeling well.  Able to do her regular activities. Her BP is slightly lower so reminded to sit on side of bed in AM and careful with standing.      Past Medical History:  Diagnosis Date  . Abnormal cardiovascular stress test 09/20/2013   Possibly abnormal nuclear stress at [place]-anteroseptal abnormality interpreted as possible breast attenuation as well, motion artifact. Normal EF. No TID. Watching medically  . Diabetes (HCC) 09/20/2013  . Diabetes mellitus (HCC)    Type 2  . Essential hypertension 09/20/2013  . H/O: hysterectomy   .  Hyperlipidemia 09/20/2013  . RA (rheumatoid arthritis) (HCC)   . Sleep apnea    uses cpap    Past Surgical History:  Procedure Laterality Date  . CARDIAC CATHETERIZATION  02/12/2015  . LEFT HEART CATHETERIZATION WITH CORONARY ANGIOGRAM N/A 02/12/2015   Procedure: LEFT HEART CATHETERIZATION WITH CORONARY ANGIOGRAM;  Surgeon: Lennette Bihari, MD;  Location: [facility] CATH LAB;  Service: Cardiovascular;  Laterality: N/A;  . TOTAL ABDOMINAL HYSTERECTOMY       Current Outpatient Medications  Medication Sig Dispense Refill  . ACCU-CHEK AVIVA PLUS test strip 1 each by Other route 2 (two) times daily.     Marland Kitchen aspirin EC 81 MG tablet Take 81 mg by mouth daily.    Marland Kitchen atorvastatin (LIPITOR) 20 MG tablet Take 1 tablet (20 mg total) by mouth daily. 30 tablet 6  . diltiazem (CARDIZEM CD) 120 MG 24 hr capsule Take 1 capsule (120 mg total) by mouth daily. 30 capsule 6  . GLIPIZIDE XL 10 MG 24 hr tablet Take 10 mg by mouth daily.     . hydrochlorothiazide (HYDRODIURIL) 25 MG tablet Take 25 mg by mouth daily.     . isosorbide mononitrate (IMDUR) 60 MG 24 hr tablet Take 1 tablet (60 mg total) by mouth daily. 90 tablet 3  . LANTUS SOLOSTAR 100 UNIT/ML SOPN Inject 30 Units into the skin daily.     Marland Kitchen losartan (COZAAR) 50 MG tablet Take 50 mg by mouth daily.     Marland Kitchen LYRICA 100 MG capsule Take 100 mg by mouth 2 (two)  times daily.  1  . nitroGLYCERIN (NITROSTAT) 0.4 MG SL tablet Place 1 tablet (0.4 mg total) under the tongue every 5 (five) minutes as needed. 25 tablet 6  . Omega-3 Fatty Acids (FISH OIL PO) Take 1,300 mg by mouth daily.    Marland Kitchen OVER THE COUNTER MEDICATION Apply 1 patch topically daily as needed (back pain). Pain patches    . polyethylene glycol (MIRALAX / GLYCOLAX) packet Take 17 g by mouth daily as needed for mild constipation, moderate constipation or severe constipation.    . topiramate (TOPAMAX) 25 MG tablet Take 1 tablet (25 mg total) by mouth at bedtime. 30 tablet 11  . traMADol (ULTRAM) 50 MG tablet  Take 50 mg by mouth as directed.      No current facility-administered medications for this visit.     Allergies:   Lisinopril    Social History:  The patient  reports that  has never smoked. Her smokeless tobacco use includes snuff. She reports that she does not drink alcohol or use drugs.   Family History:  The patient's family history includes Diabetes in her mother.    ROS:  General:no colds or fevers, no weight changes Skin:no rashes or ulcers HEENT:no blurred vision, no congestion CV:see HPI PUL:see HPI  GU:no hematuria, no dysuria  Neuro:no syncope, no lightheadedness  Wt Readings from Last 3 Encounters:  10/20/17 202 lb 6.4 oz (91.8 kg)  09/21/17 197 lb (89.4 kg)  03/04/17 202 lb (91.6 kg)     PHYSICAL EXAM: VS:  BP 100/62   Pulse 91   Resp 16   Ht 5\' 3"  (1.6 m)   Wt 202 lb 6.4 oz (91.8 kg)   SpO2 94%   BMI 35.85 kg/m  , BMI Body mass index is 35.85 kg/m. General:Pleasant affect, NAD Skin:Warm and dry, brisk capillary refill HEENT:normocephalic, sclera clear, mucus membranes moist Neck:supple, no JVD, no bruits  Heart:S1S2 RRR without murmur, gallup, rub or click Lungs:clear without rales, rhonchi, or wheezes , non tender, + BS, do not palpate liver spleen or masses Ext:no lower ext edema, 2+ pedal pulses, 2+ radial pulses Neuro:alert and oriented, MAE, follows commands, + facial symmetry    EKG:  EKG is NOT ordered today.    Recent Labs: No results found for requested labs within last 8760 hours.    Lipid Panel    Component Value Date/Time   CHOL 184 10/17/2014 0946   TRIG 311.0 (H) 10/17/2014 0946   HDL 44.80 10/17/2014 0946   CHOLHDL 4 10/17/2014 0946   VLDL 62.2 (H) 10/17/2014 0946   LDLDIRECT 64.8 10/17/2014 0946       Other studies Reviewed: Additional studies/ records that were reviewed today include:  Cath 2016 ANGIOGRAPHY:  Left main:  Angiographically short vessel which  trifurcated into a large LAD, a diminutive  ramus intermediate vessel, and a large left circumflex coronary artery.  LAD: There evidence for mild proximal calcification of the LAD.  The LAD gave rise to 2 proximal diagonal vessels.  There was scattered 30% mild narrowing in the LAD between the first and second diagonal, and also after the second diagonal vessel.  The LAD was large caliber and extended to the left ventricular apex.  The vessel gave rise to several septal perforating arteries.    Ramus Intermediate: Very small diminutive vessel that had 80% proximal diffuse stenosis in a vessel that was approximately 1.5 mm.   Left circumflex:  Large vessel that gave rise to one major bifurcating obtuse marginal  branch.  The circumflex was free of significant disease.  Right coronary artery:  Moderate sized vessel that had smooth 40% eccentric proximal narrowing.  There was 40-50% narrowing just beyond the acute margin.  There was 40-50% mid PDA stenosis.   Left ventriculography revealed  normal LV function with an ejection fraction of 55%.  There were no significant wall motion abnormalities.  There is no evidence for mitral regurgitation.    Total contrast used: 80 cc Omnipaque    IMPRESSION:  Normal LV function with an ejection fraction of approximately 55%.    Mild coronary obstructive disease with evidence for mild coronary calcification of the proximal LAD with segmental 30% tandem proximal to mid LAD stenoses; 80% proximal stenosis in a diminutive ramus intermediate vessel; normal large left circumflex coronary artery; mild 40% proximal, 40-50% stenosis just beyond the acute margin and 40-50% mid PDA stenoses.     ASSESSMENT AND PLAN:  1.  Angina with DOE has resolved with increase of imdur.  Follow up with Dr. Anne Fu in 3 months.   2.  CAD with last cath 2016, pt did not wish to undergo cardiac cath currently.  She will continue on current meds but if pain returns she should call and we will discuss cardiac  cath.  3.  HTN, today borderline but no dizziness or lightheadedness.  Continue Imdur, if BP remains low may need to decrease HCTZ.       Current medicines are reviewed with the patient today.  The patient Has no concerns regarding medicines.  The following changes have been made:  See above Labs/ tests ordered today include:see above  Disposition:   FU:  see above  Signed, Nada Boozer, NP  10/20/2017 2:53 PM    [facility] [address] [place], [place], [place]  [address]/ [address] [place], [place] Phone: [phone]; Fax: [phone]  [phone]

## 2017-10-21 ENCOUNTER — Ambulatory Visit: Payer: Medicare Other | Admitting: Cardiology

## 2018-01-13 ENCOUNTER — Encounter: Payer: Self-pay | Admitting: Cardiology

## 2018-01-20 ENCOUNTER — Ambulatory Visit (INDEPENDENT_AMBULATORY_CARE_PROVIDER_SITE_OTHER): Payer: Medicare Other | Admitting: Cardiology

## 2018-01-20 ENCOUNTER — Encounter: Payer: Self-pay | Admitting: Cardiology

## 2018-01-20 VITALS — BP 118/78 | HR 92 | Ht 63.0 in | Wt 198.8 lb

## 2018-01-20 DIAGNOSIS — I2510 Atherosclerotic heart disease of native coronary artery without angina pectoris: Secondary | ICD-10-CM

## 2018-01-20 DIAGNOSIS — E119 Type 2 diabetes mellitus without complications: Secondary | ICD-10-CM

## 2018-01-20 DIAGNOSIS — I209 Angina pectoris, unspecified: Secondary | ICD-10-CM

## 2018-01-20 DIAGNOSIS — I10 Essential (primary) hypertension: Secondary | ICD-10-CM | POA: Diagnosis not present

## 2018-01-20 NOTE — Patient Instructions (Signed)

## 2018-01-20 NOTE — Progress Notes (Signed)
Cardiology Office Note:    Date:  01/20/2018   ID:  [name], DOB [birth year], MRN [id number]  PCP:  Tacey Ruiz, FNP  Cardiologist:  No primary care provider on file.   Referring MD: Tacey Ruiz, FNP     History of Present Illness:    Kristen Weber is a 84 y.o. female here for follow-up, shortness of breath and chest tightness.  Previous catheterization in March 2016 showed mild coronary artery disease with mild coronary artery calcification of the proximal LAD 80% stenosis of a some very small ramus branch, not amenable to PCI, 40% circumflex, 40-50% mid PDA stenosis.  At a prior visit in October 2018 she did not wish to pursue cardiac catheterization so isosorbide was increased.  Thankfully, this seemed to help control her angina.  She was not having any further chest pain or shortness of breath.  Continues to do quite well, no chest pain, no shortness of breath.  Sometimes when she gets up quickly she may feel slight dizziness.  She walks with a cane.  No bleeding, no syncope, no orthopnea, no PND.  Past Medical History:  Diagnosis Date  . Abnormal cardiovascular stress test 09/20/2013   Possibly abnormal nuclear stress at [place]-anteroseptal abnormality interpreted as possible breast attenuation as well, motion artifact. Normal EF. No TID. Watching medically  . Diabetes (HCC) 09/20/2013  . Diabetes mellitus (HCC)    Type 2  . Essential hypertension 09/20/2013  . H/O: hysterectomy   . Hyperlipidemia 09/20/2013  . RA (rheumatoid arthritis) (HCC)   . Sleep apnea    uses cpap    Past Surgical History:  Procedure Laterality Date  . CARDIAC CATHETERIZATION  02/12/2015  . LEFT HEART CATHETERIZATION WITH CORONARY ANGIOGRAM N/A 02/12/2015   Procedure: LEFT HEART CATHETERIZATION WITH CORONARY ANGIOGRAM;  Surgeon: Lennette Bihari, MD;  Location: [facility] CATH LAB;  Service: Cardiovascular;  Laterality: N/A;  . TOTAL ABDOMINAL HYSTERECTOMY      Current  Medications: Current Meds  Medication Sig  . ACCU-CHEK AVIVA PLUS test strip 1 each by Other route 2 (two) times daily.   Marland Kitchen aspirin EC 81 MG tablet Take 81 mg by mouth daily.  Marland Kitchen atorvastatin (LIPITOR) 20 MG tablet Take 1 tablet (20 mg total) by mouth daily.  . Cholecalciferol (VITAMIN D3 PO) Take 850 mg by mouth 2 (two) times daily.  Marland Kitchen diltiazem (CARDIZEM CD) 120 MG 24 hr capsule Take 1 capsule (120 mg total) by mouth daily.  Marland Kitchen GLIPIZIDE XL 10 MG 24 hr tablet Take 10 mg by mouth daily.   . hydrochlorothiazide (HYDRODIURIL) 25 MG tablet Take 25 mg by mouth daily.   . isosorbide mononitrate (IMDUR) 60 MG 24 hr tablet Take 1 tablet (60 mg total) by mouth daily.  Marland Kitchen LANTUS SOLOSTAR 100 UNIT/ML SOPN Inject 30 Units into the skin daily.   Marland Kitchen losartan (COZAAR) 50 MG tablet Take 50 mg by mouth daily.   . nitroGLYCERIN (NITROSTAT) 0.4 MG SL tablet Place 1 tablet (0.4 mg total) under the tongue every 5 (five) minutes as needed.  . Omega-3 Fatty Acids (FISH OIL PO) Take 1,300 mg by mouth daily.  . polyethylene glycol (MIRALAX / GLYCOLAX) packet Take 17 g by mouth daily as needed for mild constipation, moderate constipation or severe constipation.  . topiramate (TOPAMAX) 25 MG tablet Take 1 tablet (25 mg total) by mouth at bedtime.  . traMADol (ULTRAM) 50 MG tablet Take 50 mg by mouth as directed.   . traMADol (ULTRAM-ER) 100 MG 24  hr tablet Take 100 mg by mouth daily.     Allergies:   Lisinopril   Social History   Socioeconomic History  . Marital status: Widowed    Spouse name: Greggory Stallion  . Number of children: 2  . Years of education: 67  . Highest education level: None  Social Needs  . Financial resource strain: None  . Food insecurity - worry: None  . Food insecurity - inability: None  . Transportation needs - medical: None  . Transportation needs - non-medical: None  Occupational History  . Occupation: Retired  Tobacco Use  . Smoking status: Never Smoker  . Smokeless tobacco: Current User     Types: Snuff  Substance and Sexual Activity  . Alcohol use: No    Alcohol/week: 0.0 oz  . Drug use: No  . Sexual activity: Not Currently  Other Topics Concern  . None  Social History Narrative   Patient lives alone 03/04/17   Patient has 2 children.    Patient is retired.   Patient is right-handed.     Caffeine use: Drinks 1 cup coffee per day     Family History: The patient's family history includes Diabetes in her mother.  ROS:   Please see the history of present illness.     All other systems reviewed and are negative.  EKGs/Labs/Other Studies Reviewed:    The following studies were reviewed today: Prior office notes reviewed, lab work reviewed, EKG reviewed  EKG: 09/21/17-sinus rhythm with no other significant abnormality's.  Recent Labs: No results found for requested labs within last 8760 hours.  Recent Lipid Panel    Component Value Date/Time   CHOL 184 10/17/2014 0946   TRIG 311.0 (H) 10/17/2014 0946   HDL 44.80 10/17/2014 0946   CHOLHDL 4 10/17/2014 0946   VLDL 62.2 (H) 10/17/2014 0946   LDLDIRECT 64.8 10/17/2014 0946    Physical Exam:    VS:  BP 118/78   Pulse 92   Ht 5\' 3"  (1.6 m)   Wt 198 lb 12.8 oz (90.2 kg)   SpO2 93%   BMI 35.22 kg/m     Wt Readings from Last 3 Encounters:  01/20/18 198 lb 12.8 oz (90.2 kg)  10/20/17 202 lb 6.4 oz (91.8 kg)  09/21/17 197 lb (89.4 kg)     GEN: Well nourished, well developed, in no acute distress, obese HEENT: normal  Neck: no JVD, carotid bruits, or masses Cardiac: RRR; no murmurs, rubs, or gallops,no edema  Respiratory:  clear to auscultation bilaterally, normal work of breathing GI: soft, nontender, nondistended, + BS MS: no deformity or atrophy  Skin: warm and dry, no rash Neuro:  Alert and Oriented x 3, Strength and sensation are intact Psych: euthymic mood, full affect   ASSESSMENT:    1. Angina pectoris (HCC)   2. Coronary artery disease involving native coronary artery of native heart  without angina pectoris   3. Diabetes mellitus with coincident hypertension (HCC)    PLAN:    In order of problems listed above:  Angina, stable -She does have moderate coronary artery disease, does not appear to be flow-limiting from her prior cardiac catheterization 2016, certainly some of this may have progressed.  She did not wish to proceed with cardiac catheterization.  We will continue with isosorbide./CAD  Diabetes with Essential hypertension - Sometimes can be fairly low.  Not complaining of any syncope or dizziness.  If this continues to be the case, may need to stop or decrease her HCTZ.   -  A1c6.6 - great  - atorvastatin, LDL 64  Diabetes with hypertension -Per primary team.   Medication Adjustments/Labs and Tests Ordered: Current medicines are reviewed at length with the patient today.  Concerns regarding medicines are outlined above.  No orders of the defined types were placed in this encounter.  No orders of the defined types were placed in this encounter.   Signed, Donato Schultz, MD  01/20/2018 9:33 AM    [facility]

## 2018-03-21 ENCOUNTER — Other Ambulatory Visit: Payer: Self-pay | Admitting: *Deleted

## 2018-03-21 MED ORDER — TOPIRAMATE 25 MG PO TABS: 25.0000 mg | ORAL_TABLET | Freq: Every day | ORAL | 1 refills | Status: DC

## 2018-03-21 NOTE — Telephone Encounter (Signed)
Has appt 04-27-18 with Dr. Lucia Gaskins.

## 2018-04-27 ENCOUNTER — Encounter

## 2018-04-27 ENCOUNTER — Encounter: Payer: Self-pay | Admitting: Neurology

## 2018-04-27 ENCOUNTER — Ambulatory Visit (INDEPENDENT_AMBULATORY_CARE_PROVIDER_SITE_OTHER): Payer: Medicare Other | Admitting: Neurology

## 2018-04-27 VITALS — BP 100/62 | HR 74 | Ht 63.0 in | Wt 196.0 lb

## 2018-04-27 DIAGNOSIS — G250 Essential tremor: Secondary | ICD-10-CM

## 2018-04-27 MED ORDER — TOPIRAMATE 25 MG PO TABS: 25.0000 mg | ORAL_TABLET | Freq: Two times a day (BID) | ORAL | 11 refills | Status: DC

## 2018-04-27 NOTE — Progress Notes (Signed)
[facility]    Provider:  Dr Lucia Gaskins Referring Provider: Tacey Ruiz, FNP Primary Care Physician:  Tacey Ruiz, FNP  CC:  Essential tremor  Interval history: 84 year old patient with PMHx of DM, essential tremor, HLD, RA, Sleep Apnea. Spoke to patient. She has tried Sinemet, propranol, neurontin and primidone all with side effects. Unfortunately those are the best medications for essential tremor. Tried Topamax for essential tremor. Also paresthesias in the feet due to diabetes. She canceled emg/ncs on the that RN ordered which likely would not have given [place] any more information, foot pain likely diabetic. She takes tylenol and tramadol for the foot pain.  Tremor usually with action, no resting tremor.  She is walking slower.  Sinemet didn;t help.   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 female here for follow up of tremor. She was diagnosed with essential tremor. She was started on propranolol which worked well. Tremor with action, when drinking a cup or buttoning a blouse or picking up a plate. Slowly progressive and affecting daily life. She has sleep apnea and uses her mask. Not tired during the day. She tried gabapentin in the past and had side effects. No tremor in the head or voice. She doesn't drink a lot of coffee but she eats a lot of chocolate.    Reviewed notes, labs and imaging from outside physicians, which showed:  Reviewed notes from Dr. Hosie Poisson:  03/05/2014: Kristen Weber is a 84 y.o. female here as a follow up for tremor. Returns today with continued tremor and concern over continued pain in bilateral knees. Scheduled to follow up for aquatic therapy for treatment. No other acute concerns at this time.   Initial visit 11/2013: Tremor started around a year ago, seems to be getting worse. Unsure if started unilateral or bilateral. No rest tremor, more of an action/postural tremor.  Causing difficulty eating. Shaking of hand when holding cup. Handwriting has gotten messier and smaller. No change in voice. Has some muscle stiffness. Feels she has slowed down, not walking as fast, feels she shuffles some. No known history of REM behavior disorder. No aggravating factors for tremors. No change with caffeine. Does not drink EtOH. No family history of tremor. Memory is sharp. Normal sense of smell.   Has history of DM, HTN and chronic arthritic pain.   Review of Systems: Patient complains of symptoms per HPI as well as the following symptoms:no cp, no sob . Pertinent negatives per HPI. All others negative.   Social History   Socioeconomic History  . Marital status: Widowed    Spouse name: Greggory Stallion  . Number of children: 2  . Years of education: 66  . Highest education level: Not on file  Occupational History  . Occupation: Retired  Engineer, production  . Financial resource strain: Not on file  . Food insecurity:    Worry: Not on file    Inability: Not on file  . Transportation needs:    Medical: Not on file    Non-medical: Not on file  Tobacco Use  . Smoking status: Never Smoker  . Smokeless tobacco: Current User    Types: Snuff  Substance and Sexual Activity  . Alcohol use: No    Alcohol/week: 0.0 oz  . Drug use: No  . Sexual activity: Not Currently  Lifestyle  . Physical activity:    Days per week: Not on file    Minutes per session:  Not on file  . Stress: Not on file  Relationships  . Social connections:    Talks on phone: Not on file    Gets together: Not on file    Attends religious service: Not on file    Active member of club or organization: Not on file    Attends meetings of clubs or organizations: Not on file    Relationship status: Not on file  . Intimate partner violence:    Fear of current or ex partner: Not on file    Emotionally abused: Not on file    Physically abused: Not on file    Forced sexual activity: Not on file  Other Topics Concern   . Not on file  Social History Narrative   Patient lives alone 03/04/17   Patient has 2 children.    Patient is retired.   Patient is right-handed.     Caffeine use: Drinks 1 cup coffee per day    Family History  Problem Relation Age of Onset  . Diabetes Mother     Past Medical History:  Diagnosis Date  . Abnormal cardiovascular stress test 09/20/2013   Possibly abnormal nuclear stress at [place]-anteroseptal abnormality interpreted as possible breast attenuation as well, motion artifact. Normal EF. No TID. Watching medically  . Diabetes (HCC) 09/20/2013  . Diabetes mellitus (HCC)    Type 2  . Essential hypertension 09/20/2013  . H/O: hysterectomy   . Hyperlipidemia 09/20/2013  . RA (rheumatoid arthritis) (HCC)   . Sleep apnea    uses cpap    Past Surgical History:  Procedure Laterality Date  . CARDIAC CATHETERIZATION  02/12/2015  . LEFT HEART CATHETERIZATION WITH CORONARY ANGIOGRAM N/A 02/12/2015   Procedure: LEFT HEART CATHETERIZATION WITH CORONARY ANGIOGRAM;  Surgeon: Lennette Bihari, MD;  Location: [facility] CATH LAB;  Service: Cardiovascular;  Laterality: N/A;  . TOTAL ABDOMINAL HYSTERECTOMY      Current Outpatient Medications  Medication Sig Dispense Refill  . ACCU-CHEK AVIVA PLUS test strip 1 each by Other route 2 (two) times daily.     Marland Kitchen aspirin EC 81 MG tablet Take 81 mg by mouth daily.    Marland Kitchen atorvastatin (LIPITOR) 20 MG tablet Take 1 tablet (20 mg total) by mouth daily. 30 tablet 6  . Cholecalciferol (VITAMIN D3 PO) Take 850 mg by mouth 2 (two) times daily.    Marland Kitchen diltiazem (CARDIZEM CD) 120 MG 24 hr capsule Take 1 capsule (120 mg total) by mouth daily. 30 capsule 6  . GLIPIZIDE XL 10 MG 24 hr tablet Take 10 mg by mouth daily.     . hydrochlorothiazide (HYDRODIURIL) 25 MG tablet Take 25 mg by mouth daily.     . isosorbide mononitrate (IMDUR) 60 MG 24 hr tablet Take 1 tablet (60 mg total) by mouth daily. 90 tablet 3  . LANTUS SOLOSTAR 100 UNIT/ML SOPN Inject 30 Units into  the skin daily.     Marland Kitchen losartan (COZAAR) 50 MG tablet Take 50 mg by mouth daily.     . nitroGLYCERIN (NITROSTAT) 0.4 MG SL tablet Place 1 tablet (0.4 mg total) under the tongue every 5 (five) minutes as needed. 25 tablet 6  . Omega-3 Fatty Acids (FISH OIL PO) Take 1,300 mg by mouth daily.    . polyethylene glycol (MIRALAX / GLYCOLAX) packet Take 17 g by mouth daily as needed for mild constipation, moderate constipation or severe constipation.    . topiramate (TOPAMAX) 25 MG tablet Take 1 tablet (25 mg total) by mouth  at bedtime. 30 tablet 1  . traMADol (ULTRAM) 50 MG tablet Take 50 mg by mouth as directed.     . traMADol (ULTRAM-ER) 100 MG 24 hr tablet Take 100 mg by mouth daily.  0   No current facility-administered medications for this visit.     Allergies as of 04/27/2018 - Review Complete 01/20/2018  Allergen Reaction Noted  . Lisinopril Cough 10/17/2014    Vitals: There were no vitals taken for this visit. Last Weight:  Wt Readings from Last 1 Encounters:  01/20/18 198 lb 12.8 oz (90.2 kg)   Last Height:   Ht Readings from Last 1 Encounters:  01/20/18 5\' 3"  (1.6 m)    No change on exam, stable:  Speech: fluent w/o paraphasic error  Memory: good recent and remote recall  CN: PERRL, EOMI no nystagmus, no ptosis, sensation intact to LT V1-V3 bilat, face symmetric, no weakness, hearing grossly intact, palate elevates symmetrically, shoulder shrug 5/5 bilat,  tongue protrudes midline, no fasiculations noted.  Motor: normal bulk and tone Strength: 5/5 In all extremities  Coord: rapid alternating and point-to-point (FNF, HTS) movements intact. No rest tremor noted. No action or postural tremor noted. Mild right greater left intention tremor of hands. Bradykinesia noted in bilateral hands with finger opening and closing, finger tapping and rapid alternating movements. Bradykinesia with foot tapping.   Reflexes: symmetrical, bilat downgoing toes  Sens: LT intact in all  extremities  Gait: Stands slowly without assistance, mildly stooped, no re-emergent tremor.  Assessment and Plan: Likely essential tremor however has some parkinsonian features will have to monitor. She did not respond to Sinemet, she did not tolerate multiple medications including beta-blockers, primidone, gabapentin  Increase topamax Refer to OT for other interventions such as wrist weight, weighted silverware, tremor is mild  As far as diagnostic testing: Thyroid level was unremarkable in the past  Orders Placed This Encounter  Procedures  . Ambulatory referral to Occupational Therapy    , MD  [facility] [address] [place], [place]  Phone [phone] Fax [phone]  A total of 25 minutes was spent face-to-face with this patient. Over half this time was spent on counseling patient on the essential tremor diagnosis and different diagnostic and therapeutic options available.

## 2018-04-27 NOTE — Patient Instructions (Addendum)
Increase Topiramate  Occupational Therapy  Topiramate tablets What is this medicine? TOPIRAMATE (toe PYRE a mate) is used to treat seizures in adults or children with epilepsy. It is also used for the prevention of migraine headaches. This medicine may be used for other purposes; ask your health care provider or pharmacist if you have questions. COMMON BRAND NAME(S): Topamax, Topiragen What should I tell my health care provider before I take this medicine? They need to know if you have any of these conditions: -bleeding disorders -cirrhosis of the liver or liver disease -diarrhea -glaucoma -kidney stones or kidney disease -low blood counts, like low white cell, platelet, or red cell counts -lung disease like asthma, obstructive pulmonary disease, emphysema -metabolic acidosis -on a ketogenic diet -schedule for surgery or a procedure -suicidal thoughts, plans, or attempt; a previous suicide attempt by you or a family member -an unusual or allergic reaction to topiramate, other medicines, foods, dyes, or preservatives -pregnant or trying to get pregnant -breast-feeding How should I use this medicine? Take this medicine by mouth with a glass of water. Follow the directions on the prescription label. Do not crush or chew. You may take this medicine with meals. Take your medicine at regular intervals. Do not take it more often than directed. Talk to your pediatrician regarding the use of this medicine in children. Special care may be needed. While this drug may be prescribed for children as young as 68 years of age for selected conditions, precautions do apply. Overdosage: If you think you have taken too much of this medicine contact a poison control center or emergency room at once. NOTE: This medicine is only for you. Do not share this medicine with others. What if I miss a dose? If you miss a dose, take it as soon as you can. If your next dose is to be taken in less than 6 hours, then do  not take the missed dose. Take the next dose at your regular time. Do not take double or extra doses. What may interact with this medicine? Do not take this medicine with any of the following medications: -probenecid This medicine may also interact with the following medications: -acetazolamide -alcohol -amitriptyline -aspirin and aspirin-like medicines -birth control pills -certain medicines for depression -certain medicines for seizures -certain medicines that treat or prevent blood clots like warfarin, enoxaparin, dalteparin, apixaban, dabigatran, and rivaroxaban -digoxin -hydrochlorothiazide -lithium -medicines for pain, sleep, or muscle relaxation -metformin -methazolamide -NSAIDS, medicines for pain and inflammation, like ibuprofen or naproxen -pioglitazone -risperidone This list may not describe all possible interactions. Give your health care provider a list of all the medicines, herbs, non-prescription drugs, or dietary supplements you use. Also tell them if you smoke, drink alcohol, or use illegal drugs. Some items may interact with your medicine. What should I watch for while using this medicine? Visit your doctor or health care professional for regular checks on your progress. Do not stop taking this medicine suddenly. This increases the risk of seizures if you are using this medicine to control epilepsy. Wear a medical identification bracelet or chain to say you have epilepsy or seizures, and carry a card that lists all your medicines. This medicine can decrease sweating and increase your body temperature. Watch for signs of deceased sweating or fever, especially in children. Avoid extreme heat, hot baths, and saunas. Be careful about exercising, especially in hot weather. Contact your health care provider right away if you notice a fever or decrease in sweating. You should drink plenty  of fluids while taking this medicine. If you have had kidney stones in the past, this will  help to reduce your chances of forming kidney stones. If you have stomach pain, with nausea or vomiting and yellowing of your eyes or skin, call your doctor immediately. You may get drowsy, dizzy, or have blurred vision. Do not drive, use machinery, or do anything that needs mental alertness until you know how this medicine affects you. To reduce dizziness, do not sit or stand up quickly, especially if you are an older patient. Alcohol can increase drowsiness and dizziness. Avoid alcoholic drinks. If you notice blurred vision, eye pain, or other eye problems, seek medical attention at once for an eye exam. The use of this medicine may increase the chance of suicidal thoughts or actions. Pay special attention to how you are responding while on this medicine. Any worsening of mood, or thoughts of suicide or dying should be reported to your health care professional right away. This medicine may increase the chance of developing metabolic acidosis. If left untreated, this can cause kidney stones, bone disease, or slowed growth in children. Symptoms include breathing fast, fatigue, loss of appetite, irregular heartbeat, or loss of consciousness. Call your doctor immediately if you experience any of these side effects. Also, tell your doctor about any surgery you plan on having while taking this medicine since this may increase your risk for metabolic acidosis. Birth control pills may not work properly while you are taking this medicine. Talk to your doctor about using an extra method of birth control. Women who become pregnant while using this medicine may enroll in the [place] American Antiepileptic Drug Pregnancy Registry by calling [phone]. This registry collects information about the safety of antiepileptic drug use during pregnancy. What side effects may I notice from receiving this medicine? Side effects that you should report to your doctor or health care professional as soon as possible: -allergic  reactions like skin rash, itching or hives, swelling of the face, lips, or tongue -decreased sweating and/or rise in body temperature -depression -difficulty breathing, fast or irregular breathing patterns -difficulty speaking -difficulty walking or controlling muscle movements -hearing impairment -redness, blistering, peeling or loosening of the skin, including inside the mouth -tingling, pain or numbness in the hands or feet -unusual bleeding or bruising -unusually weak or tired -worsening of mood, thoughts or actions of suicide or dying Side effects that usually do not require medical attention (report to your doctor or health care professional if they continue or are bothersome): -altered taste -back pain, joint or muscle aches and pains -diarrhea, or constipation -headache -loss of appetite -nausea -stomach upset, indigestion -tremors This list may not describe all possible side effects. Call your doctor for medical advice about side effects. You may report side effects to FDA at 1-800-FDA-1088. Where should I keep my medicine? Keep out of the reach of children. Store at room temperature between 15 and 30 degrees C (59 and 86 degrees F) in a tightly closed container. Protect from moisture. Throw away any unused medicine after the expiration date. NOTE: This sheet is a summary. It may not cover all possible information. If you have questions about this medicine, talk to your doctor, pharmacist, or health care provider.  2018 Elsevier/Gold Standard (2013-11-13 23:17:57)  Essential Tremor A tremor is trembling or shaking that you cannot control. Most tremors affect the hands or arms. Tremors can also affect the head, vocal cords, face, and other parts of the body. Essential tremor is a  tremor without a known cause. What are the causes? Essential tremor has no known cause. What increases the risk? You may be at greater risk of essential tremor if:  You have a family member with  essential tremor.  You are age 53 or older.  You take certain medicines.  What are the signs or symptoms? The main sign of a tremor is uncontrolled and unintentional rhythmic shaking of a body part.  You may have difficulty eating with a spoon or fork.  You may have difficulty writing.  You may nod your head up and down or side to side.  You may have a quivering voice.  Your tremors:  May get worse over time.  May come and go.  May be more noticeable on one side of your body.  May get worse due to stress, fatigue, caffeine, and extreme heat or cold.  How is this diagnosed? Your health care provider can diagnose essential tremor based on your symptoms, medical history, and a physical examination. There is no single test to diagnose an essential tremor. However, your health care provider may perform a variety of tests to rule out other conditions. Tests may include:  Blood and urine tests.  Imaging studies of your brain, such as: ? CT scan. ? MRI.  A test that measures involuntary muscle movement (electromyogram).  How is this treated? Your tremors may go away without treatment. Mild tremors may not need treatment if they do not affect your day-to-day life. Severe tremors may need to be treated using one or a combination of the following options:  Medicines. This may include medicine that is injected.  Lifestyle changes.  Physical therapy.  Follow these instructions at home:  Take medicines only as directed by your health care provider.  Limit alcohol intake to no more than 1 drink per day for nonpregnant women and 2 drinks per day for men. One drink equals 12 oz of beer, 5 oz of wine, or 1 oz of hard liquor.  Do not use any tobacco products, including cigarettes, chewing tobacco, or electronic cigarettes. If you need help quitting, ask your health care provider.  Take medicines only as directed by your health care provider.  Avoid extreme heat or  cold.  Limit the amount of caffeine you consumeas directed by your health care provider.  Try to get eight hours of sleep each night.  Find ways to manage your stress, such as meditation or yoga.  Keep all follow-up visits as directed by your health care provider. This is important. This includes any physical therapy visits. Contact a health care provider if:  You experience any changes in the location or intensity of your tremors.  You start having a tremor after starting a new medicine.  You have tremor with other symptoms such as: ? Numbness. ? Tingling. ? Pain. ? Weakness.  Your tremor gets worse.  Your tremor interferes with your daily life. This information is not intended to replace advice given to you by your health care provider. Make sure you discuss any questions you have with your health care provider. Document Released: 11/30/2014 Document Revised: 04/16/2016 Document Reviewed: 05/07/2014 Elsevier Interactive Patient Education  [employer].

## 2018-04-28 ENCOUNTER — Encounter: Payer: Self-pay | Admitting: Neurology

## 2018-05-12 ENCOUNTER — Telehealth: Payer: Self-pay | Admitting: Neurology

## 2018-05-12 NOTE — Telephone Encounter (Signed)
Kristen Weber with [facility] [phone]) requesting a call back to discuss the medication and s/e topiramate (TOPAMAX) 25 MG tablet

## 2018-05-12 NOTE — Telephone Encounter (Signed)
I called pt to discuss. I called pt's home number and left a message asking her to call me back. I also called pt's daughter, Brenda's number, per DPR, but no VM was set up.

## 2018-05-12 NOTE — Telephone Encounter (Signed)
I called [place] at [facility]. She reports that they reviewed pt's medications with her recently. Pt reported to them that she has been forgetful lately, which is a side effect of topamax. She also told the pharmacy that she didn't know why she was taking topamax. I advised [place] Citizen that I will call the pt to discuss.

## 2018-05-13 NOTE — Telephone Encounter (Signed)
I called pt. She advised me that she is quite aware that topamax is for her tremor. She denies any confusion regarding her topamax. Pt thanked me for my call.

## 2018-09-13 ENCOUNTER — Other Ambulatory Visit: Payer: Self-pay | Admitting: Physician Assistant

## 2018-12-20 ENCOUNTER — Other Ambulatory Visit: Payer: Self-pay | Admitting: Cardiology

## 2019-01-09 ENCOUNTER — Ambulatory Visit (INDEPENDENT_AMBULATORY_CARE_PROVIDER_SITE_OTHER): Payer: Medicare Other | Admitting: Cardiology

## 2019-01-09 ENCOUNTER — Encounter (INDEPENDENT_AMBULATORY_CARE_PROVIDER_SITE_OTHER): Payer: Self-pay

## 2019-01-09 ENCOUNTER — Other Ambulatory Visit: Payer: Self-pay | Admitting: Internal Medicine

## 2019-01-09 ENCOUNTER — Encounter: Payer: Self-pay | Admitting: Cardiology

## 2019-01-09 VITALS — BP 116/64 | HR 78 | Ht 63.0 in | Wt 196.6 lb

## 2019-01-09 DIAGNOSIS — I2510 Atherosclerotic heart disease of native coronary artery without angina pectoris: Secondary | ICD-10-CM | POA: Diagnosis not present

## 2019-01-09 DIAGNOSIS — I10 Essential (primary) hypertension: Secondary | ICD-10-CM | POA: Diagnosis not present

## 2019-01-09 DIAGNOSIS — I208 Other forms of angina pectoris: Secondary | ICD-10-CM | POA: Diagnosis not present

## 2019-01-09 DIAGNOSIS — I2089 Other forms of angina pectoris: Secondary | ICD-10-CM

## 2019-01-09 DIAGNOSIS — E782 Mixed hyperlipidemia: Secondary | ICD-10-CM

## 2019-01-09 MED ORDER — ASPIRIN EC 81 MG PO TBEC: 81.0000 mg | DELAYED_RELEASE_TABLET | Freq: Every day | ORAL | 3 refills | Status: AC

## 2019-01-09 MED ORDER — ATORVASTATIN CALCIUM 20 MG PO TABS: 20.0000 mg | ORAL_TABLET | Freq: Every day | ORAL | 3 refills | Status: DC

## 2019-01-09 MED ORDER — LOSARTAN POTASSIUM 50 MG PO TABS: 50.0000 mg | ORAL_TABLET | Freq: Every day | ORAL | 3 refills | Status: DC

## 2019-01-09 MED ORDER — ISOSORBIDE MONONITRATE ER 60 MG PO TB24: 60.0000 mg | ORAL_TABLET | Freq: Every morning | ORAL | 3 refills | Status: DC

## 2019-01-09 MED ORDER — NITROGLYCERIN 0.4 MG SL SUBL: 0.4000 mg | SUBLINGUAL_TABLET | SUBLINGUAL | 6 refills | Status: DC | PRN

## 2019-01-09 MED ORDER — HYDROCHLOROTHIAZIDE 25 MG PO TABS: 25.0000 mg | ORAL_TABLET | Freq: Every day | ORAL | 3 refills | Status: DC

## 2019-01-09 MED ORDER — DILTIAZEM HCL ER COATED BEADS 120 MG PO CP24: 120.0000 mg | ORAL_CAPSULE | Freq: Every day | ORAL | 3 refills | Status: DC

## 2019-01-09 NOTE — Progress Notes (Signed)
Cardiology Office Note:    Date:  01/09/2019   ID:  [name], DOB May 12, 1934, MRN [id number]  PCP:  Tacey Ruiz, FNP  Cardiologist:  No primary care provider on file.   Referring MD: Tacey Ruiz, FNP     History of Present Illness:    Kristen Weber is a 85 y.o. female here for follow-up, shortness of breath and chest tightness.  Previous catheterization in March 2016 showed mild coronary artery disease with mild coronary artery calcification of the proximal LAD 80% stenosis of a some very small ramus branch, not amenable to PCI, 40% circumflex, 40-50% mid PDA stenosis.  At a prior visit in October 2018 she did not wish to pursue cardiac catheterization so isosorbide was increased.  Thankfully, this seemed to help control her angina.  She was not having any further chest pain or shortness of breath.  Continues to do quite well, no chest pain, no shortness of breath.  Sometimes when she gets up quickly she may feel slight dizziness.  She walks with a cane.  No bleeding, no syncope, no orthopnea, no PND.  01/09/2019-here for follow-up of coronary artery disease, angina, diabetes.  Overall been doing quite well.  1 of her main complaints is a trouble clearing her phlegm in the back of her throat, sometimes this makes her feel like she is choking.  She is not having any overt trouble swallowing food however.  This is nonexertional, can occur when sitting or laying down.  Sensation of choking.  She is taking her medications without any difficulty.  No myalgias.  Feeling well.  Denies any fevers chills nausea vomiting.  See below.  Past Medical History:  Diagnosis Date  . Abnormal cardiovascular stress test 09/20/2013   Possibly abnormal nuclear stress at [place]-anteroseptal abnormality interpreted as possible breast attenuation as well, motion artifact. Normal EF. No TID. Watching medically  . Diabetes (HCC) 09/20/2013  . Diabetes mellitus (HCC)    Type 2  . Essential  hypertension 09/20/2013  . H/O: hysterectomy   . Hyperlipidemia 09/20/2013  . RA (rheumatoid arthritis) (HCC)   . Sleep apnea    uses cpap    Past Surgical History:  Procedure Laterality Date  . CARDIAC CATHETERIZATION  02/12/2015  . LEFT HEART CATHETERIZATION WITH CORONARY ANGIOGRAM N/A 02/12/2015   Procedure: LEFT HEART CATHETERIZATION WITH CORONARY ANGIOGRAM;  Surgeon: Lennette Bihari, MD;  Location: [facility] CATH LAB;  Service: Cardiovascular;  Laterality: N/A;  . TOTAL ABDOMINAL HYSTERECTOMY      Current Medications: Current Meds  Medication Sig  . ACCU-CHEK AVIVA PLUS test strip 1 each by Other route 2 (two) times daily.   Marland Kitchen aspirin EC 81 MG tablet Take 1 tablet (81 mg total) by mouth at bedtime.  Marland Kitchen atorvastatin (LIPITOR) 20 MG tablet Take 1 tablet (20 mg total) by mouth daily.  . Cholecalciferol (VITAMIN D3 PO) Take 850 mg by mouth 2 (two) times daily.  Marland Kitchen diltiazem (CARDIZEM CD) 120 MG 24 hr capsule Take 1 capsule (120 mg total) by mouth at bedtime.  Marland Kitchen GLIPIZIDE XL 10 MG 24 hr tablet Take 10 mg by mouth daily.   . hydrochlorothiazide (HYDRODIURIL) 25 MG tablet Take 1 tablet (25 mg total) by mouth at bedtime.  . isosorbide mononitrate (IMDUR) 60 MG 24 hr tablet Take 1 tablet (60 mg total) by mouth every morning.  Marland Kitchen LANTUS SOLOSTAR 100 UNIT/ML SOPN Inject 30 Units into the skin daily.   Marland Kitchen losartan (COZAAR) 50 MG tablet Take 1 tablet (50  mg total) by mouth at bedtime.  . nitroGLYCERIN (NITROSTAT) 0.4 MG SL tablet Place 1 tablet (0.4 mg total) under the tongue every 5 (five) minutes as needed.  . Omega-3 Fatty Acids (FISH OIL PO) Take 1,300 mg by mouth daily.  . polyethylene glycol (MIRALAX / GLYCOLAX) packet Take 17 g by mouth daily as needed for mild constipation, moderate constipation or severe constipation.  . topiramate (TOPAMAX) 25 MG tablet Take 1 tablet (25 mg total) by mouth 2 (two) times daily.  . traMADol (ULTRAM) 50 MG tablet Take 50 mg by mouth as needed.   . traMADol  (ULTRAM-ER) 100 MG 24 hr tablet Take 100 mg by mouth at bedtime.   . [DISCONTINUED] aspirin EC 81 MG tablet Take 81 mg by mouth at bedtime.   . [DISCONTINUED] atorvastatin (LIPITOR) 20 MG tablet Take 1 tablet (20 mg total) by mouth daily.  . [DISCONTINUED] diltiazem (CARDIZEM CD) 120 MG 24 hr capsule Take 1 capsule (120 mg total) by mouth daily. (Patient taking differently: Take 120 mg by mouth at bedtime. )  . [DISCONTINUED] hydrochlorothiazide (HYDRODIURIL) 25 MG tablet Take 25 mg by mouth at bedtime.   . [DISCONTINUED] isosorbide mononitrate (IMDUR) 60 MG 24 hr tablet Take 1 tablet (60 mg total) by mouth every morning. Please keep upcoming appt in February with Dr. Anne Fu for future refills. Thank you  . [DISCONTINUED] losartan (COZAAR) 50 MG tablet Take 50 mg by mouth at bedtime.   . [DISCONTINUED] nitroGLYCERIN (NITROSTAT) 0.4 MG SL tablet Place 1 tablet (0.4 mg total) under the tongue every 5 (five) minutes as needed.     Allergies:   Lisinopril   Social History   Socioeconomic History  . Marital status: Widowed    Spouse name: Greggory Stallion  . Number of children: 2  . Years of education: 44  . Highest education level: Not on file  Occupational History  . Occupation: Retired  Engineer, production  . Financial resource strain: Not on file  . Food insecurity:    Worry: Not on file    Inability: Not on file  . Transportation needs:    Medical: Not on file    Non-medical: Not on file  Tobacco Use  . Smoking status: Never Smoker  . Smokeless tobacco: Current User    Types: Snuff  Substance and Sexual Activity  . Alcohol use: No    Alcohol/week: 0.0 standard drinks  . Drug use: No  . Sexual activity: Not Currently  Lifestyle  . Physical activity:    Days per week: Not on file    Minutes per session: Not on file  . Stress: Not on file  Relationships  . Social connections:    Talks on phone: Not on file    Gets together: Not on file    Attends religious service: Not on file    Active  member of club or organization: Not on file    Attends meetings of clubs or organizations: Not on file    Relationship status: Not on file  Other Topics Concern  . Not on file  Social History Narrative   Patient lives alone 03/04/17   Patient has 2 children.    Patient is retired.   Patient is right-handed.     Caffeine use: Drinks 1 cup coffee per day     Family History: The patient's family history includes Diabetes in her mother.  ROS:   Please see the history of present illness.   Leg swelling waking up at night  short of breath visual disturbance cough shortness of breath with activity constipation back pain muscle pain joint swelling chest pressure leg pain  All other systems reviewed and are negative.  EKGs/Labs/Other Studies Reviewed:    The following studies were reviewed today: Prior office notes reviewed, lab work reviewed, EKG reviewed  EKG: 01/09/2019-sinus rhythm 78 borderline low voltage otherwise unremarkable personally reviewed.  09/21/17-sinus rhythm with no other significant abnormality's.  Recent Labs: No results found for requested labs within last 8760 hours.  Recent Lipid Panel    Component Value Date/Time   CHOL 184 10/17/2014 0946   TRIG 311.0 (H) 10/17/2014 0946   HDL 44.80 10/17/2014 0946   CHOLHDL 4 10/17/2014 0946   VLDL 62.2 (H) 10/17/2014 0946   LDLDIRECT 64.8 10/17/2014 0946    Physical Exam:    VS:  BP 116/64   Pulse 78   Ht 5\' 3"  (1.6 m)   Wt 196 lb 9.6 oz (89.2 kg)   BMI 34.83 kg/m     Wt Readings from Last 3 Encounters:  01/09/19 196 lb 9.6 oz (89.2 kg)  04/27/18 196 lb (88.9 kg)  01/20/18 198 lb 12.8 oz (90.2 kg)     GEN: Well nourished, well developed, in no acute distress  HEENT: normal  Neck: no JVD, carotid bruits, or masses Cardiac: RRR; no murmurs, rubs, or gallops,no edema  Respiratory:  clear to auscultation bilaterally, normal work of breathing GI: soft, nontender, nondistended, + BS MS: no deformity or atrophy    Skin: warm and dry, no rash Neuro:  Alert and Oriented x 3, Strength and sensation are intact Psych: euthymic mood, full affect    ASSESSMENT:    1. Stable angina (HCC)   2. Coronary artery disease involving native coronary artery of native heart without angina pectoris   3. Essential hypertension   4. Mixed hyperlipidemia    PLAN:    In order of problems listed above:  Angina, stable underlying coronary artery disease -She does have moderate coronary artery disease, does not appear to be flow-limiting from her prior cardiac catheterization 2016, certainly some of this may have progressed.  She did not wish to proceed with cardiac catheterization previously.  We will continue with isosorbide, aspirin, statin, blood pressure and diabetes control.  Overall I do not think that her mucous/choking-like sensation periodically is related to angina.  She may require swallowing study.  Could be at risk for aspiration.  Diabetes with Essential hypertension - Sometimes can be fairly low.  Not complaining of any syncope or dizziness.  If this continues to be the case, may need to stop or decrease her HCTZ.  Overall 116/64 at this visit.  No changes made.  Feeling well.  - A1c6.6 previously- great doing well.  - atorvastatin, LDL 61, no changes made.  Continue with atorvastatin  Hyperlipidemia -Continue with atorvastatin   Medication Adjustments/Labs and Tests Ordered: Current medicines are reviewed at length with the patient today.  Concerns regarding medicines are outlined above.  Orders Placed This Encounter  Procedures  . EKG 12-Lead   Meds ordered this encounter  Medications  . aspirin EC 81 MG tablet    Sig: Take 1 tablet (81 mg total) by mouth at bedtime.    Dispense:  90 tablet    Refill:  3  . atorvastatin (LIPITOR) 20 MG tablet    Sig: Take 1 tablet (20 mg total) by mouth daily.    Dispense:  90 tablet    Refill:  3  .  diltiazem (CARDIZEM CD) 120 MG 24 hr capsule    Sig:  Take 1 capsule (120 mg total) by mouth at bedtime.    Dispense:  90 capsule    Refill:  3  . hydrochlorothiazide (HYDRODIURIL) 25 MG tablet    Sig: Take 1 tablet (25 mg total) by mouth at bedtime.    Dispense:  90 tablet    Refill:  3  . isosorbide mononitrate (IMDUR) 60 MG 24 hr tablet    Sig: Take 1 tablet (60 mg total) by mouth every morning.    Dispense:  90 tablet    Refill:  3  . losartan (COZAAR) 50 MG tablet    Sig: Take 1 tablet (50 mg total) by mouth at bedtime.    Dispense:  90 tablet    Refill:  3  . nitroGLYCERIN (NITROSTAT) 0.4 MG SL tablet    Sig: Place 1 tablet (0.4 mg total) under the tongue every 5 (five) minutes as needed.    Dispense:  25 tablet    Refill:  6    Signed, Donato Schultz, MD  01/09/2019 9:40 AM    [facility]

## 2019-01-09 NOTE — Patient Instructions (Signed)
Medication Instructions:  Your physician recommends that you continue on your current medications as directed. Please refer to the Current Medication list given to you today.  If you need a refill on your cardiac medications before your next appointment, please call your pharmacy.   Lab work: None ordered If you have labs (blood work) drawn today and your tests are completely normal, you will receive your results only by: . MyChart Message (if you have MyChart) OR . A paper copy in the mail If you have any lab test that is abnormal or we need to change your treatment, we will call you to review the results.  Testing/Procedures: None ordered  Follow-Up: At [facility], you and your health needs are our priority.  As part of our continuing mission to provide you with exceptional heart care, we have created designated Provider Care Teams.  These Care Teams include your primary Cardiologist (physician) and Advanced Practice Providers (APPs -  Physician Assistants and Nurse Practitioners) who all work together to provide you with the care you need, when you need it. You will need a follow up appointment in 12 months.  Please call our office 2 months in advance to schedule this appointment.  You may see Dr. Mark Skains or one of the following Advanced Practice Providers on your designated Care Team:   Lori Gerhardt, NP Laura Ingold, NP . Jill McDaniel, NP  Any Other Special Instructions Will Be Listed Below (If Applicable).    

## 2019-04-28 ENCOUNTER — Other Ambulatory Visit: Payer: Self-pay | Admitting: Neurology

## 2019-06-14 ENCOUNTER — Ambulatory Visit: Payer: Medicare Other | Admitting: Neurology

## 2019-06-14 ENCOUNTER — Ambulatory Visit (INDEPENDENT_AMBULATORY_CARE_PROVIDER_SITE_OTHER): Payer: Medicare Other | Admitting: Neurology

## 2019-06-14 ENCOUNTER — Other Ambulatory Visit: Payer: Self-pay

## 2019-06-14 ENCOUNTER — Encounter: Payer: Self-pay | Admitting: Neurology

## 2019-06-14 VITALS — BP 112/79 | HR 80 | Temp 97.7°F | Ht 63.0 in | Wt 197.0 lb

## 2019-06-14 DIAGNOSIS — G250 Essential tremor: Secondary | ICD-10-CM

## 2019-06-14 MED ORDER — TOPIRAMATE 50 MG PO TABS: 50.0000 mg | ORAL_TABLET | Freq: Two times a day (BID) | ORAL | 4 refills | Status: DC

## 2019-06-14 NOTE — Progress Notes (Signed)
[facility]    Provider:  Dr Jaynee Eagles Referring Provider: Alvera Singh, [place] Primary Care Physician:  Alvera Singh, FNP  CC:  Essential tremor  Interval history 06/14/2019:  85 year old patient with PMHx of DM, essential tremor, HLD, RA, Sleep Apnea. Spoke to patient. She has tried Sinemet, propranol, neurontin and primidone all with side effects. Unfortunately those are the best medications for essential tremor. Also tried Topamax and Sinemet for essential tremor.  She feels it is worse, especially with writing, also holding things like coffee, she spills a lot. No resting tremor, no new shuffling. She never went to Occupational Therapy.   Interval history: 85 year old patient with PMHx of DM, essential tremor, HLD, RA, Sleep Apnea. Spoke to patient. She has tried Sinemet, propranol, neurontin and primidone all with side effects. Unfortunately those are the best medications for essential tremor. Tried Topamax for essential tremor. Also paresthesias in the feet due to diabetes. She canceled emg/ncs on the that RN ordered which likely would not have given [place] any more information, foot pain likely diabetic. She takes tylenol and tramadol for the foot pain.  Tremor usually with action, no resting tremor.  She is walking slower.  Sinemet didn;t help.   Reviewed EMG/NCS results and data c/w sensory axonal polyneuropathy   HPI:  Kristen Weber is a 85 y.o. female here for follow up of tremor. She was diagnosed with essential tremor. She was started on propranolol which worked well. Tremor with action, when drinking a cup or buttoning a blouse or picking up a plate. Slowly progressive and affecting daily life. She has sleep apnea and uses her mask. Not tired during the day. She tried gabapentin in the past and had side effects. No tremor in the head or voice. She doesn't drink a lot of coffee but she eats a lot of chocolate.    Reviewed notes, labs and imaging from outside  physicians, which showed:  Reviewed notes from Dr. Janann Colonel:  03/05/2014: Kristen Weber is a 85 y.o. female here as a follow up for tremor. Returns today with continued tremor and concern over continued pain in bilateral knees. Scheduled to follow up for aquatic therapy for treatment. No other acute concerns at this time.   Initial visit 11/2013: Tremor started around a year ago, seems to be getting worse. Unsure if started unilateral or bilateral. No rest tremor, more of an action/postural tremor. Causing difficulty eating. Shaking of hand when holding cup. Handwriting has gotten messier and smaller. No change in voice. Has some muscle stiffness. Feels she has slowed down, not walking as fast, feels she shuffles some. No known history of REM behavior disorder. No aggravating factors for tremors. No change with caffeine. Does not drink EtOH. No family history of tremor. Memory is sharp. Normal sense of smell.   Has history of DM, HTN and chronic arthritic pain.   Review of Systems: Patient complains of symptoms per HPI as well as the following symptoms:no cp, no sob . Pertinent negatives per HPI. All others negative.   Social History   Socioeconomic History  . Marital status: Widowed    Spouse name: Kristen Weber  . Number of children: 2  . Years of education: 89  . Highest education level: Not on file  Occupational History  . Occupation: Retired  Scientific laboratory technician  . Financial resource strain: Not on file  . Food insecurity    Worry: Not on file    Inability: Not on file  . Transportation needs  Medical: Not on file    Non-medical: Not on file  Tobacco Use  . Smoking status: Never Smoker  . Smokeless tobacco: Current User    Types: Snuff  Substance and Sexual Activity  . Alcohol use: No    Alcohol/week: 0.0 standard drinks  . Drug use: No  . Sexual activity: Not Currently  Lifestyle  . Physical activity    Days per week: Not on file    Minutes per session: Not on file  . Stress: Not  on file  Relationships  . Social Musician on phone: Not on file    Gets together: Not on file    Attends religious service: Not on file    Active member of club or organization: Not on file    Attends meetings of clubs or organizations: Not on file    Relationship status: Not on file  . Intimate partner violence    Fear of current or ex partner: Not on file    Emotionally abused: Not on file    Physically abused: Not on file    Forced sexual activity: Not on file  Other Topics Concern  . Not on file  Social History Narrative   Patient lives alone 03/04/17   Patient has 2 children.    Patient is retired.   Patient is right-handed.     Caffeine use: Drinks 1 cup coffee per day    Family History  Problem Relation Age of Onset  . Diabetes Mother     Past Medical History:  Diagnosis Date  . Abnormal cardiovascular stress test 09/20/2013   Possibly abnormal nuclear stress at [place]-anteroseptal abnormality interpreted as possible breast attenuation as well, motion artifact. Normal EF. No TID. Watching medically  . Diabetes (HCC) 09/20/2013  . Diabetes mellitus (HCC)    Type 2  . Essential hypertension 09/20/2013  . H/O: hysterectomy   . Hyperlipidemia 09/20/2013  . RA (rheumatoid arthritis) (HCC)   . Sleep apnea    uses cpap    Past Surgical History:  Procedure Laterality Date  . CARDIAC CATHETERIZATION  02/12/2015  . LEFT HEART CATHETERIZATION WITH CORONARY ANGIOGRAM N/A 02/12/2015   Procedure: LEFT HEART CATHETERIZATION WITH CORONARY ANGIOGRAM;  Surgeon: Lennette Bihari, MD;  Location: [facility] CATH LAB;  Service: Cardiovascular;  Laterality: N/A;  . TOTAL ABDOMINAL HYSTERECTOMY      Current Outpatient Medications  Medication Sig Dispense Refill  . ACCU-CHEK AVIVA PLUS test strip 1 each by Other route 2 (two) times daily.     Marland Kitchen aspirin EC 81 MG tablet Take 1 tablet (81 mg total) by mouth at bedtime. 90 tablet 3  . atorvastatin (LIPITOR) 20 MG tablet Take 1  tablet (20 mg total) by mouth daily. 90 tablet 3  . Cholecalciferol (VITAMIN D3 PO) Take 850 mg by mouth 2 (two) times daily.    Marland Kitchen diltiazem (CARDIZEM CD) 120 MG 24 hr capsule Take 1 capsule (120 mg total) by mouth at bedtime. 90 capsule 3  . GLIPIZIDE XL 10 MG 24 hr tablet Take 10 mg by mouth daily.     . hydrochlorothiazide (HYDRODIURIL) 25 MG tablet Take 1 tablet (25 mg total) by mouth at bedtime. 90 tablet 3  . isosorbide mononitrate (IMDUR) 60 MG 24 hr tablet Take 1 tablet (60 mg total) by mouth every morning. 90 tablet 3  . LANTUS SOLOSTAR 100 UNIT/ML SOPN Inject 30 Units into the skin daily.     Marland Kitchen losartan (COZAAR) 50 MG tablet Take  1 tablet (50 mg total) by mouth at bedtime. 90 tablet 3  . traMADol (ULTRAM) 50 MG tablet Take 50 mg by mouth as needed.     . traMADol (ULTRAM-ER) 100 MG 24 hr tablet Take 100 mg by mouth at bedtime.   0  . nitroGLYCERIN (NITROSTAT) 0.4 MG SL tablet Place 1 tablet (0.4 mg total) under the tongue every 5 (five) minutes as needed. (Patient not taking: Reported on 06/14/2019) 25 tablet 6  . topiramate (TOPAMAX) 50 MG tablet Take 1 tablet (50 mg total) by mouth 2 (two) times daily. 180 tablet 4   No current facility-administered medications for this visit.     Allergies as of 06/14/2019 - Review Complete 06/14/2019  Allergen Reaction Noted  . Lisinopril Cough 10/17/2014    Vitals: BP 112/79 (BP Location: Left Arm, Patient Position: Sitting)   Pulse 80   Temp 97.7 F (36.5 C) Comment: taken by front staff  Ht 5\' 3"  (1.6 m)   Wt 197 lb (89.4 kg)   BMI 34.90 kg/m  Last Weight:  Wt Readings from Last 1 Encounters:  06/14/19 197 lb (89.4 kg)   Last Height:   Ht Readings from Last 1 Encounters:  06/14/19 5\' 3"  (1.6 m)    No change on exam, stable exam 06/14/2019  Speech: fluent w/o paraphasic error  Memory: good recent and remote recall  CN: PERRL, EOMI no nystagmus, no ptosis, sensation intact to LT V1-V3 bilat, face symmetric, no weakness,  hearing grossly intact, palate elevates symmetrically, shoulder shrug 5/5 bilat,  tongue protrudes midline, no fasiculations noted.  Motor: normal bulk and tone Strength: 5/5 In all extremities  Coord: rapid alternating and point-to-point (FNF, HTS) movements intact. No rest tremor noted. No action or postural tremor noted. Mild right greater left intention tremor of hands. Bradykinesia noted in bilateral hands with finger opening and closing, finger tapping and rapid alternating movements. Bradykinesia with foot tapping.   Reflexes: symmetrical, bilat downgoing toes  Sens: LT intact in all extremities  Gait: Stands slowly without assistance, mildly stooped, no re-emergent tremor.  Mild tremor postural and action, high freq, low amplitude.  Assessment and Plan: Likely essential tremor.. She did not respond to Sinemet, she did not tolerate multiple medications including beta-blockers, primidone, gabapentin.  Fortunately doing well on Topamax!  - Increase topamax today, she has tolerated and said done well - Essential tremor please help with occupational therapy as well as tools to help such as weighted cups, weighted silverware, weighted wrist splints or anything that can be helpful for reducing tremors during actions such as eating, writing, drinking. 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 Topamax may help with diabetic neuropathy pain as well.   Orders Placed This Encounter  Procedures  . Ambulatory referral to Occupational Therapy   Meds ordered this encounter  Medications  . topiramate (TOPAMAX) 50 MG tablet    Sig: Take 1 tablet (50 mg total) by mouth 2 (two) times daily.    Dispense:  180 tablet    Refill:  4    , MD  [facility] [address] [place], [address] [place]  Phone [phone] Fax [phone]  A total of 25 minutes was spent face-to-face with this patient. Over half this time was spent on counseling patient on the essential tremor diagnosis  and different diagnostic and therapeutic options available.

## 2019-06-14 NOTE — Patient Instructions (Addendum)
- Increase Topiramate to 50mg  twice daily for Essential Tremor - Email me with updates and follow up in 6 months - Essential tremor please help with occupational therapy as well as tools to help such as weighted cups, weighted silverware, weighted wrist splints or anything that can be helpful for reducing tremors during actions such as eating, writing, drinking.   Essential Tremor A tremor is trembling or shaking that a person cannot control. Most tremors affect the hands or arms. Tremors can also affect the head, vocal cords, legs, and other parts of the body. Essential tremor is a tremor without a known cause. Usually, it occurs while a person is trying to perform an action. It tends to get worse gradually as a person ages. What are the causes? The cause of this condition is not known. What increases the risk? You are more likely to develop this condition if:  You have a family member with essential tremor.  You are age 77 or older.  You take certain medicines. What are the signs or symptoms? The main sign of a tremor is a rhythmic shaking of certain parts of your body that is uncontrolled and unintentional. You may:  Have difficulty eating with a spoon or fork.  Have difficulty writing.  Nod your head up and down or side to side.  Have a quivering voice. The shaking may:  Get worse over time.  Come and go.  Be more noticeable on one side of your body.  Get worse due to stress, fatigue, caffeine, and extreme heat or cold. How is this diagnosed? This condition may be diagnosed based on:  Your symptoms and medical history.  A physical exam. There is no single test to diagnose an essential tremor. However, your health care provider may order tests to rule out other causes of your condition. These may include:  Blood and urine tests.  Imaging studies of your brain, such as CT scan and MRI.  A test that measures involuntary muscle movement (electromyogram). How is this  treated? Treatment for essential tremor depends on the severity of the condition.  Some tremors may go away without treatment.  Mild tremors may not need treatment if they do not affect your day-to-day life.  Severe tremors may need to be treated using one or more of the following options: ? Medicines. ? Lifestyle changes. ? Occupational or physical therapy. Follow these instructions at home: Lifestyle   Do not use any products that contain nicotine or tobacco, such as cigarettes and e-cigarettes. If you need help quitting, ask your health care provider.  Limit your caffeine intake as told by your health care provider.  Try to get 8 hours of sleep each night.  Find ways to manage your stress that fits your lifestyle and personality. Consider trying meditation or yoga.  Try to anticipate stressful situations and allow extra time to manage them.  If you are struggling emotionally with the effects of your tremor, consider working with a mental health provider. General instructions  Take over-the-counter and prescription medicines only as told by your health care provider.  Avoid extreme heat and extreme cold.  Keep all follow-up visits as told by your health care provider. This is important. Visits may include physical therapy visits. Contact a health care provider if:  You experience any changes in the location or intensity of your tremors.  You start having a tremor after starting a new medicine.  You have tremor with other symptoms, such as: ? Numbness. ? Tingling. ?  Pain. ? Weakness.  Your tremor gets worse.  Your tremor interferes with your daily life.  You feel down, blue, or sad for at least 2 weeks in a row.  Worrying about your tremor and what other people think about you interferes with your everyday life functions, including relationships, work, or school. Summary  Essential tremor is a tremor without a known cause. Usually, it occurs when you are trying  to perform an action.  The cause of this condition is not known.  The main sign of a tremor is a rhythmic shaking of certain parts of your body that is uncontrolled and unintentional.  Treatment for essential tremor depends on the severity of the condition. This information is not intended to replace advice given to you by your health care provider. Make sure you discuss any questions you have with your health care provider. Document Released: 11/30/2014 Document Revised: 11/19/2017 Document Reviewed: 11/19/2017 Elsevier Patient Education  2020 [employer].

## 2019-06-15 ENCOUNTER — Other Ambulatory Visit: Payer: Self-pay | Admitting: *Deleted

## 2019-06-15 ENCOUNTER — Telehealth: Payer: Self-pay | Admitting: Neurology

## 2019-06-15 MED ORDER — TOPIRAMATE 25 MG PO TABS: 25.0000 mg | ORAL_TABLET | Freq: Two times a day (BID) | ORAL | 3 refills | Status: DC

## 2019-06-15 NOTE — Telephone Encounter (Signed)
I called the patient and discussed the topiramate. She stated the pharmacy had just called her. She understands the plan to now take Topiramate 25 mg BID, not 50 mg. She verbalized appreciation and had no questions.

## 2019-06-15 NOTE — Telephone Encounter (Signed)
Returned [employer] call @ [facility]. She stated they have been "bubble wrapping" the pt's medication for her and she had requested once a day. Chelsea stated the pt had only been taking 25 mg of Topiramate once a day and not twice so they would like to clarify the dosage change that Dr. Jaynee Eagles would like.

## 2019-06-15 NOTE — Telephone Encounter (Signed)
E-scribed order for Topiramate 25 mg BID since pt has only been taking it once daily per pharmacy. I spoke with Vikki Ports at [facility]. She confirmed receipt of the new order for the Topiramate 25 mg twice daily.

## 2019-06-15 NOTE — Telephone Encounter (Signed)
Chelsea from [facility] called with a question on the pt's topiramate (TOPAMAX) 50 MG tablet dosage. Please advise.

## 2019-06-15 NOTE — Telephone Encounter (Signed)
The last time I prescribed it was prescribed for twice daily but if she has only been taking it once daily then increase to 25mg  twice daily now please. thanks

## 2019-08-01 ENCOUNTER — Telehealth: Payer: Self-pay | Admitting: *Deleted

## 2019-08-01 NOTE — Telephone Encounter (Signed)
PT progress note reviewed and signed by Dr. Jaynee Eagles. Returned via fax to [employer]. Received a receipt of confirmation.

## 2019-09-25 ENCOUNTER — Other Ambulatory Visit: Payer: Self-pay | Admitting: Cardiology

## 2019-09-27 ENCOUNTER — Telehealth: Payer: Self-pay | Admitting: Cardiology

## 2019-09-27 NOTE — Telephone Encounter (Signed)
I spoke with pt's daughter. She reports Dr Buddy Duty recently stopped pt's HCTZ due to elevated calcium level.  Primary care also decreased pt's Losartan to 25 mg daily due to low BP recently. Daughter is asking if Dr Marlou Porch wants to make any changes in her medication or if office visit needed. Daughter reports pt told her BP is good but these readings are not available. Pt is complaining of dizziness and head/eyes hurting today.  Does not feel like she will pass out. No chest pain. Daughter feels it could be related to sinuses. I asked daughter to have pt check BP/pulse daily about 2 hours after taking morning medications for next 7-10 days and call us with these readings.  I advised her to let us know if dizziness worsens.

## 2019-09-27 NOTE — Telephone Encounter (Signed)
New Message     Pts daughter is calling and would like for the nurse to call her back  She says she has questions about her medications    Please call back

## 2019-09-27 NOTE — Telephone Encounter (Signed)
Agree with current plan made by mother physicians.  Continue to monitor. Candee Furbish, MD

## 2019-09-29 ENCOUNTER — Telehealth: Payer: Self-pay | Admitting: Cardiology

## 2019-09-29 NOTE — Telephone Encounter (Signed)
See phone note 09/27/19. Updated medication list. Called pharmacy to let them know patient is taking Losartan 25 mg daily.

## 2019-09-29 NOTE — Telephone Encounter (Signed)
Pt c/o medication issue:  1. Name of Medication: losartan (COZAAR) 50 MG tablet  2. How are you currently taking this medication (dosage and times per day)? Patient states she was told to start taking 1/2 tablet once daily  3. Are you having a reaction (difficulty breathing--STAT)? no  4. What is your medication issue? Alwyn Ren from [facility] needs verification from Dr. Marlou Porch or a nurse stating that the patient is to take 1/2 tablet daily. Please advise.

## 2019-11-27 NOTE — Progress Notes (Deleted)
CARDIOLOGY OFFICE NOTE  Date:  11/27/2019    [name] Date of Birth: 05/12/1934 Medical Record #[id number]  PCP:  Tacey Ruiz, FNP  Cardiologist:  Anne Fu   No chief complaint on file.   History of Present Illness: Kristen Weber is a 87 y.o. female who presents today for a work in visit. Seen for Dr. Anne Fu.   She has a history of prior cath from 2016 with mild CAD with mild coronary artery calcification of the proximal LAD, 80% stenosis of a very small ramus branch, not amenable to PCI, 40% circumflex, 40-50% mid PDA stenosis.  At a prior visit in October 2018 she did not wish to pursue cardiac catheterization so isosorbide was increased due to chest pain.   Last seen by Dr. Anne Fu in February - was doing well other than some issue with clearing her throat and feeling like she was choking.   The patient {does/does not:200015} have symptoms concerning for COVID-19 infection (fever, chills, cough, or new shortness of breath).   Comes in today. Here with   Past Medical History:  Diagnosis Date  . Abnormal cardiovascular stress test 09/20/2013   Possibly abnormal nuclear stress at [facility]-anteroseptal abnormality interpreted as possible breast attenuation as well, motion artifact. Normal EF. No TID. Watching medically  . Diabetes (HCC) 09/20/2013  . Diabetes mellitus (HCC)    Type 2  . Essential hypertension 09/20/2013  . H/O: hysterectomy   . Hyperlipidemia 09/20/2013  . RA (rheumatoid arthritis) (HCC)   . Sleep apnea    uses cpap    Past Surgical History:  Procedure Laterality Date  . CARDIAC CATHETERIZATION  02/12/2015  . LEFT HEART CATHETERIZATION WITH CORONARY ANGIOGRAM N/A 02/12/2015   Procedure: LEFT HEART CATHETERIZATION WITH CORONARY ANGIOGRAM;  Surgeon: Lennette Bihari, MD;  Location: [facility] CATH LAB;  Service: Cardiovascular;  Laterality: N/A;  . TOTAL ABDOMINAL HYSTERECTOMY       Medications: No outpatient medications have been marked as taking for  the 11/28/19 encounter (Appointment) with Rosalio Macadamia, NP.     Allergies: Allergies  Allergen Reactions  . Lisinopril Cough    cough    Social History: The patient  reports that she has never smoked. Her smokeless tobacco use includes snuff. She reports that she does not drink alcohol or use drugs.   Family History: The patient's ***family history includes Diabetes in her mother.   Review of Systems: Please see the history of present illness.   All other systems are reviewed and negative.   Physical Exam: VS:  There were no vitals taken for this visit. Marland Kitchen  BMI There is no height or weight on file to calculate BMI.  Wt Readings from Last 3 Encounters:  06/14/19 197 lb (89.4 kg)  01/09/19 196 lb 9.6 oz (89.2 kg)  04/27/18 196 lb (88.9 kg)    General: Pleasant. Well developed, well nourished and in no acute distress.   HEENT: Normal.  Neck: Supple, no JVD, carotid bruits, or masses noted.  Cardiac: ***Regular rate and rhythm. No murmurs, rubs, or gallops. No edema.  Respiratory:  Lungs are clear to auscultation bilaterally with normal work of breathing.  GI: Soft and nontender.  MS: No deformity or atrophy. Gait and ROM intact.  Skin: Warm and dry. Color is normal.  Neuro:  Strength and sensation are intact and no gross focal deficits noted.  Psych: Alert, appropriate and with normal affect.   LABORATORY DATA:  EKG:  EKG {ACTION; IS/IS HEN:[id number]} ordered today.  This demonstrates ***.  Lab Results  Component Value Date   WBC 3.0 (L) 09/27/2015   HGB 12.6 09/27/2015   HCT 40.4 09/27/2015   PLT 156 09/27/2015   GLUCOSE 103 (H) 09/27/2015   CHOL 184 10/17/2014   TRIG 311.0 (H) 10/17/2014   HDL 44.80 10/17/2014   LDLDIRECT 64.8 10/17/2014   ALT 18 02/11/2015   AST 23 02/11/2015   NA 138 09/27/2015   K 3.7 09/27/2015   CL 105 09/27/2015   CREATININE 1.06 (H) 09/27/2015   BUN 15 09/27/2015   CO2 27 09/27/2015   TSH 1.830 01/30/2016   INR 1.11 02/11/2015    HGBA1C 6.6 (H) 02/11/2015     BNP (last 3 results) No results for input(s): BNP in the last 8760 hours.  ProBNP (last 3 results) No results for input(s): PROBNP in the last 8760 hours.   Other Studies Reviewed Today:    ASSESSMENT & PLAN:    1. CAD with stable angina - managed medically  2. HTN  3. HLD  4. Dysphagia  5. DM   Diabetes with Essential hypertension - Sometimes can be fairly low.  Not complaining of any syncope or dizziness.  If this continues to be the case, may need to stop or decrease her HCTZ.  Overall 116/64 at this visit.  No changes made.  Feeling well.  - A1c6.6 previously- great doing well.  - atorvastatin, LDL 61, no changes made.  Continue with atorvastatin    . COVID-19 Education: The signs and symptoms of COVID-19 were discussed with the patient and how to seek care for testing (follow up with PCP or arrange E-visit).  The importance of social distancing, staying at home, hand hygiene and wearing a mask when out in public were discussed today.  Current medicines are reviewed with the patient today.  The patient does not have concerns regarding medicines other than what has been noted above.  The following changes have been made:  See above.  Labs/ tests ordered today include:   No orders of the defined types were placed in this encounter.    Disposition:   FU with *** in {gen number 8-14:[id number]} {Days to years:10300}.   Patient is agreeable to this plan and will call if any problems develop in the interim.   SignedTruitt Merle, NP  11/27/2019 12:39 PM  [facility] [address] [place] [employer] [place], [place]  [address] Phone: [phone] Fax: [phone]

## 2019-11-28 ENCOUNTER — Ambulatory Visit: Payer: Medicare Other | Admitting: Nurse Practitioner

## 2019-12-13 ENCOUNTER — Telehealth: Payer: Self-pay | Admitting: Nurse Practitioner

## 2019-12-13 NOTE — Telephone Encounter (Signed)
Patient's daughter returning call. 

## 2019-12-13 NOTE — Telephone Encounter (Signed)
lvm pt's daughter can come up with pt per Lawson Fiscal.  Added to appt notes.

## 2019-12-13 NOTE — Telephone Encounter (Signed)
Daughter of the patient called and wanted to make sure she would be able to come in with the patient during her appointment Mon 12-18-19. Please let her know if that is not allowed

## 2019-12-13 NOTE — Telephone Encounter (Signed)
This is ok with me.  Lawson Fiscal

## 2019-12-13 NOTE — Telephone Encounter (Signed)
lvm for daughter to see why pt needs to have assistance for office visit on Monday, Jan 25.  If no physical or mental mobilities Lawson Fiscal can put daughter on speaker phone when Lawson Fiscal gets in room with pt.

## 2019-12-14 NOTE — Progress Notes (Signed)
CARDIOLOGY OFFICE NOTE  Date:  12/18/2019    Kristen Weber Date of Birth: 1933/11/28 Medical Record #[id number]  PCP:  Alvera Singh, FNP  Cardiologist:  [facility]    Chief Complaint  Patient presents with  . Follow-up    Seen for Dr. Marlou Porch    History of Present Illness: Kristen Weber is a 86 y.o. female who presents today for a 11 month check. Seen for Dr. Marlou Porch.   She has a history of mild CAD with prior cath in 2016 showing proximal LAD 80% stenosis of a some very small ramus branch, not amenable to PCI, 40% circumflex, & 40-50% mid PDA stenosis. She has had intermittent chest pain and has not wished to have repeat cath - she has had her Imdur increased.   Last seen in February of 2020 by Dr. Marlou Porch - cardiac status felt to be stable. She was having trouble clearing the phlegm of her throat.   The patient does not have symptoms concerning for COVID-19 infection (fever, chills, cough, or new shortness of breath).   Comes in today. Here with her daughter. She is doing well. No chest pain. Breathing is good. She has lost some weight - says she has stopped eating candy - apparently really likes sweets. She is pretty active - still doing her housework - driving. Little forgetful at times per daughter but overall they are ok. No swelling. She is bothered by some knee pain but overall no worrisome concerns. Her HCTZ was stopped due to elevated CA levels. ARB has been cut back as well. BP is doing fine.   Past Medical History:  Diagnosis Date  . Abnormal cardiovascular stress test 09/20/2013   Possibly abnormal nuclear stress at [place]-anteroseptal abnormality interpreted as possible breast attenuation as well, motion artifact. Normal EF. No TID. Watching medically  . Diabetes ([place]) 09/20/2013  . Diabetes mellitus ([place])    Type 2  . Essential hypertension 09/20/2013  . H/O: hysterectomy   . Hyperlipidemia 09/20/2013  . RA (rheumatoid arthritis) ([place])   . Sleep apnea    uses cpap    Past Surgical History:  Procedure Laterality Date  . CARDIAC CATHETERIZATION  02/12/2015  . LEFT HEART CATHETERIZATION WITH CORONARY ANGIOGRAM N/A 02/12/2015   Procedure: LEFT HEART CATHETERIZATION WITH CORONARY ANGIOGRAM;  Surgeon: Troy Sine, MD;  Location: [facility] CATH LAB;  Service: Cardiovascular;  Laterality: N/A;  . TOTAL ABDOMINAL HYSTERECTOMY       Medications: Current Meds  Medication Sig  . ACCU-CHEK AVIVA PLUS test strip 1 each by Other route 2 (two) times daily.   Marland Kitchen aspirin EC 81 MG tablet Take 1 tablet (81 mg total) by mouth at bedtime.  Marland Kitchen atorvastatin (LIPITOR) 20 MG tablet Take 1 tablet (20 mg total) by mouth daily.  . Cholecalciferol (VITAMIN D3 PO) Take 850 mg by mouth 2 (two) times daily.  Marland Kitchen diltiazem (CARDIZEM CD) 120 MG 24 hr capsule Take 1 capsule (120 mg total) by mouth at bedtime.  Marland Kitchen GLIPIZIDE XL 10 MG 24 hr tablet Take 10 mg by mouth daily.   . isosorbide mononitrate (IMDUR) 60 MG 24 hr tablet Take 1 tablet (60 mg total) by mouth every morning.  Marland Kitchen LANTUS SOLOSTAR 100 UNIT/ML SOPN Inject 30 Units into the skin daily.   Marland Kitchen losartan (COZAAR) 25 MG tablet Take 25 mg by mouth daily.  . nitroGLYCERIN (NITROSTAT) 0.4 MG SL tablet Place 1 tablet (0.4 mg total) under the tongue every 5 (five) minutes as needed.  Marland Kitchen  topiramate (TOPAMAX) 25 MG tablet Take 1 tablet (25 mg total) by mouth 2 (two) times daily.  . traMADol (ULTRAM-ER) 100 MG 24 hr tablet Take 100 mg by mouth at bedtime.      Allergies: Allergies  Allergen Reactions  . Lisinopril Cough    cough    Social History: The patient  reports that she has never smoked. Her smokeless tobacco use includes snuff. She reports that she does not drink alcohol or use drugs.   Family History: The patient's family history includes Diabetes in her mother.   Review of Systems: Please see the history of present illness.   All other systems are reviewed and negative.   Physical Exam: VS:  BP 112/70   Pulse  86   Ht 5\' 3"  (1.6 m)   Wt 191 lb (86.6 kg)   BMI 33.83 kg/m  .  BMI Body mass index is 33.83 kg/m.  Wt Readings from Last 3 Encounters:  12/18/19 191 lb (86.6 kg)  06/14/19 197 lb (89.4 kg)  01/09/19 196 lb 9.6 oz (89.2 kg)    General: Pleasant. Alert and in no acute distress. Weight is down 6 pounds since July.   HEENT: Normal.  Neck: Supple, no JVD, carotid bruits, or masses noted.  Cardiac: Regular rate and rhythm. No murmurs, rubs, or gallops. No edema.  Respiratory:  Lungs are clear to auscultation bilaterally with normal work of breathing.  GI: Soft and nontender.  MS: No deformity or atrophy. Gait and ROM intact.  Skin: Warm and dry. Color is normal.  Neuro:  Strength and sensation are intact and no gross focal deficits noted.  Psych: Alert, appropriate and with normal affect.   LABORATORY DATA:  EKG:  EKG is ordered today. This demonstrates NSR - HR is 86.  Lab Results  Component Value Date   WBC 3.0 (L) 09/27/2015   HGB 12.6 09/27/2015   HCT 40.4 09/27/2015   PLT 156 09/27/2015   GLUCOSE 103 (H) 09/27/2015   CHOL 184 10/17/2014   TRIG 311.0 (H) 10/17/2014   HDL 44.80 10/17/2014   LDLDIRECT 64.8 10/17/2014   ALT 18 02/11/2015   AST 23 02/11/2015   NA 138 09/27/2015   K 3.7 09/27/2015   CL 105 09/27/2015   CREATININE 1.06 (H) 09/27/2015   BUN 15 09/27/2015   CO2 27 09/27/2015   TSH 1.830 01/30/2016   INR 1.11 02/11/2015   HGBA1C 6.6 (H) 02/11/2015       BNP (last 3 results) No results for input(s): BNP in the last 8760 hours.  ProBNP (last 3 results) No results for input(s): PROBNP in the last 8760 hours.   Other Studies Reviewed Today:    ASSESSMENT &PLAN:    1. CAD - moderate disease - managed medically. She has no worrisome symptoms. No changes made today.   2. DM - per PCP - last A1C was down to 6.4  3. Obesity - actively losing weight.   4. HTN - BP is good - she is on less ARB - BP is fine.   5. HLD - on statin.   6.  COVID-19 Education: The signs and symptoms of COVID-19 were discussed with the patient and how to seek care for testing (follow up with PCP or arrange E-visit).  The importance of social distancing, staying at home, hand hygiene and wearing a mask when out in public were discussed today.  Current medicines are reviewed with the patient today.  The patient does not have concerns  regarding medicines other than what has been noted above.  The following changes have been made:  See above.  Labs/ tests ordered today include:    Orders Placed This Encounter  Procedures  . EKG 12-Lead     Disposition:   FU with Dr. Anne Fu in about 5 months (pushed next month's visit out per their request) - they will call if needed. No changes made today.    Patient is agreeable to this plan and will call if any problems develop in the interim.   SignedNorma Fredrickson, NP  12/18/2019 1:54 PM  [facility] [address] [place], [place]  [address] Phone: [phone] Fax: [phone]

## 2019-12-18 ENCOUNTER — Other Ambulatory Visit: Payer: Self-pay

## 2019-12-18 ENCOUNTER — Encounter: Payer: Self-pay | Admitting: Nurse Practitioner

## 2019-12-18 ENCOUNTER — Ambulatory Visit (INDEPENDENT_AMBULATORY_CARE_PROVIDER_SITE_OTHER): Payer: Medicare Other | Admitting: Nurse Practitioner

## 2019-12-18 VITALS — BP 112/70 | HR 86 | Ht 63.0 in | Wt 191.0 lb

## 2019-12-18 DIAGNOSIS — E782 Mixed hyperlipidemia: Secondary | ICD-10-CM

## 2019-12-18 DIAGNOSIS — I2510 Atherosclerotic heart disease of native coronary artery without angina pectoris: Secondary | ICD-10-CM

## 2019-12-18 DIAGNOSIS — Z7189 Other specified counseling: Secondary | ICD-10-CM | POA: Diagnosis not present

## 2019-12-18 DIAGNOSIS — I10 Essential (primary) hypertension: Secondary | ICD-10-CM | POA: Diagnosis not present

## 2019-12-18 NOTE — Patient Instructions (Addendum)
After Visit Summary:  We will be checking the following labs today - NONE   Medication Instructions:    Continue with your current medicines.    If you need a refill on your cardiac medications before your next appointment, please call your pharmacy.     Testing/Procedures To Be Arranged:  N/A  Follow-Up:   See Dr.Skains in about 5 months.     At [facility], you and your health needs are our priority.  As part of our continuing mission to provide you with exceptional heart care, we have created designated Provider Care Teams.  These Care Teams include your primary Cardiologist (physician) and Advanced Practice Providers (APPs -  Physician Assistants and Nurse Practitioners) who all work together to provide you with the care you need, when you need it.  Special Instructions:  . Stay safe, stay home, wash your hands for at least 20 seconds and wear a mask when out in public.  . It was good to talk with you today.    Call the [facility] office at [phone] if you have any questions, problems or concerns.

## 2020-01-03 ENCOUNTER — Other Ambulatory Visit: Payer: Self-pay | Admitting: Cardiology

## 2020-01-08 ENCOUNTER — Telehealth: Payer: Self-pay | Admitting: Neurology

## 2020-01-08 NOTE — Telephone Encounter (Signed)
Patient daughter called stating the patients tremors is getting worst and would like to schedule a soon apt for her to be seen no sooner apts than march available at the time of call.

## 2020-01-08 NOTE — Telephone Encounter (Signed)
I called Steward Drone back and I scheduled pt with Megan NP for next Wed 2/24 @ 8:00 AM arrival 7:30 AM. She stated she would need to attend the appt d/t pt's forgetfulness. She understands there will be COVID screening at door and temps will be taken. She stated currently for her and pt, no fever/cough/SOB, no concerns of exposure or recent testing. She verbalized appreciation for the call.

## 2020-01-09 ENCOUNTER — Ambulatory Visit: Payer: Medicare Other | Admitting: Cardiology

## 2020-01-17 ENCOUNTER — Other Ambulatory Visit: Payer: Self-pay

## 2020-01-17 ENCOUNTER — Encounter: Payer: Self-pay | Admitting: Adult Health

## 2020-01-17 ENCOUNTER — Ambulatory Visit (INDEPENDENT_AMBULATORY_CARE_PROVIDER_SITE_OTHER): Payer: Medicare Other | Admitting: Adult Health

## 2020-01-17 VITALS — BP 118/80 | HR 83 | Temp 97.0°F | Ht 63.0 in | Wt 194.6 lb

## 2020-01-17 DIAGNOSIS — R440 Auditory hallucinations: Secondary | ICD-10-CM | POA: Diagnosis not present

## 2020-01-17 DIAGNOSIS — G250 Essential tremor: Secondary | ICD-10-CM | POA: Diagnosis not present

## 2020-01-17 NOTE — Patient Instructions (Signed)
Your Plan:  Continue topamax 25 mg twice a day If your symptoms worsen or you develop new symptoms please let us know.   Thank you for coming to see [place] at [facility]. I hope we have been able to provide you high quality care today.  You may receive a patient satisfaction survey over the next few weeks. We would appreciate your feedback and comments so that we may continue to improve ourselves and the health of our patients.

## 2020-01-17 NOTE — Progress Notes (Addendum)
PATIENT: Kristen Weber DOB: 03-Aug-1934  REASON FOR VISIT: follow up HISTORY FROM: patient  HISTORY OF PRESENT ILLNESS: Today 01/17/20:  [name] is an 86 year old female with a history of essential tremor.  She returns today for follow-up.  She feels that her tremors may be worse.  She does not have any trouble feeding herself.  She does notice it with her hand rains.  Feels that the tremor in the hands get worse when she is upset.  She has tried Sinemet, propranolol, Neurontin, primidone but was unable to tolerate these medications.  She is currently on Topamax but could not tolerate at higher dose.  Her daughter is with her today.  She reports that occasionally her mom reports seeing shadows in the house.  Occasionally she may hear someone calling her name or knocking at the door.  She also notices that she has trouble with her balance.  She does use a cane.  She recently got injections in the right knee.  She does have ongoing knee pain.  HISTORY (copied from Dr. Cathren Laine note) Interval history 06/14/2019:  86 year old patient with PMHx of DM, essential tremor, HLD, RA, Sleep Apnea. Spoke to patient. She has tried Sinemet, propranol, neurontin and primidone all with side effects. Unfortunately those are the best medications for essential tremor. Also tried Topamax and Sinemet for essential tremor.  She feels it is worse, especially with writing, also holding things like coffee, she spills a lot. No resting tremor, no new shuffling. She never went to Occupational Therapy.   Interval history: 86 year old patient with PMHx of DM, essential tremor, HLD, RA, Sleep Apnea. Spoke to patient. She has tried Sinemet, propranol, neurontin and primidone all with side effects. Unfortunately those are the best medications for essential tremor. Tried Topamax for essential tremor. Also paresthesias in the feet due to diabetes. She canceled emg/ncs on the that RN ordered which likely would not have given [place]  any more information, foot pain likely diabetic. She takes tylenol and tramadol for the foot pain.  Tremor usually with action, no resting tremor.  She is walking slower.  Sinemet didn;t help.   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 female here for follow up of tremor. She was diagnosed with essential tremor. She was started on propranolol which worked well. Tremor with action, when drinking a cup or buttoning a blouse or picking up a plate. Slowly progressive and affecting daily life. She has sleep apnea and uses her mask. Not tired during the day. She tried gabapentin in the past and had side effects. No tremor in the head or voice. She doesn't drink a lot of coffee but she eats a lot of chocolate.    Reviewed notes, labs and imaging from outside physicians, which showed:  Reviewed notes from Dr. Janann Colonel:  03/05/2014: Kristen Weber is a 86 y.o. female here as a follow up for tremor. Returns today with continued tremor and concern over continued pain in bilateral knees. Scheduled to follow up for aquatic therapy for treatment. No other acute concerns at this time.   Initial visit 11/2013: Tremor started around a year ago, seems to be getting worse. Unsure if started unilateral or bilateral. No rest tremor, more of an action/postural tremor. Causing difficulty eating. Shaking of hand when holding cup. Handwriting has gotten messier and smaller. No change in voice. Has some muscle stiffness. Feels she has slowed down, not  walking as fast, feels she shuffles some. No known history of REM behavior disorder. No aggravating factors for tremors. No change with caffeine. Does not drink EtOH. No family history of tremor. Memory is sharp. Normal sense of smell.   Has history of DM, HTN and chronic arthritic pain.  REVIEW OF SYSTEMS: Out of a complete 14 system review of symptoms, the patient complains only of the following symptoms, and  all other reviewed systems are negative.  See HPI  ALLERGIES: Allergies  Allergen Reactions  . Lisinopril Cough    cough    HOME MEDICATIONS: Outpatient Medications Prior to Visit  Medication Sig Dispense Refill  . ACCU-CHEK AVIVA PLUS test strip 1 each by Other route 2 (two) times daily.     Marland Kitchen aspirin EC 81 MG tablet Take 1 tablet (81 mg total) by mouth at bedtime. 90 tablet 3  . atorvastatin (LIPITOR) 20 MG tablet Take 1 tablet (20 mg total) by mouth daily. 90 tablet 3  . Cholecalciferol (VITAMIN D3 PO) Take 850 mg by mouth 2 (two) times daily.    Marland Kitchen diltiazem (CARDIZEM CD) 120 MG 24 hr capsule Take 1 capsule (120 mg total) by mouth at bedtime. 90 capsule 3  . GLIPIZIDE XL 10 MG 24 hr tablet Take 10 mg by mouth daily.     . isosorbide mononitrate (IMDUR) 60 MG 24 hr tablet Take 1 tablet (60 mg total) by mouth every morning. 90 tablet 3  . LANTUS SOLOSTAR 100 UNIT/ML SOPN Inject 28 Units into the skin daily.     Marland Kitchen loratadine (CLARITIN) 10 MG tablet Take 10 mg by mouth daily.    Marland Kitchen losartan (COZAAR) 25 MG tablet TAKE ONE TABLET AT BEDTIME 90 tablet 3  . nitroGLYCERIN (NITROSTAT) 0.4 MG SL tablet Place 1 tablet (0.4 mg total) under the tongue every 5 (five) minutes as needed. 25 tablet 6  . topiramate (TOPAMAX) 25 MG tablet Take 1 tablet (25 mg total) by mouth 2 (two) times daily. 180 tablet 3  . traMADol (ULTRAM-ER) 100 MG 24 hr tablet Take 100 mg by mouth at bedtime. 50 mg during the day  0  . UNABLE TO FIND Take 1 tablet by mouth daily. Med Name: Gas Relief OTC     No facility-administered medications prior to visit.    PAST MEDICAL HISTORY: Past Medical History:  Diagnosis Date  . Abnormal cardiovascular stress test 09/20/2013   Possibly abnormal nuclear stress at [place]-anteroseptal abnormality interpreted as possible breast attenuation as well, motion artifact. Normal EF. No TID. Watching medically  . Diabetes ([facility]) 09/20/2013  . Diabetes mellitus ([facility])    Type 2  .  Essential hypertension 09/20/2013  . H/O: hysterectomy   . Hyperlipidemia 09/20/2013  . RA (rheumatoid arthritis) ([facility])   . Sleep apnea    uses cpap    PAST SURGICAL HISTORY: Past Surgical History:  Procedure Laterality Date  . CARDIAC CATHETERIZATION  02/12/2015  . LEFT HEART CATHETERIZATION WITH CORONARY ANGIOGRAM N/A 02/12/2015   Procedure: LEFT HEART CATHETERIZATION WITH CORONARY ANGIOGRAM;  Surgeon: Lennette Bihari, MD;  Location[facility] CATH LAB;  Service: Cardiovascular;  Laterality: N/A;  . TOTAL ABDOMINAL HYSTERECTOMY      FAMILY HISTORY: Family History  Problem Relation Age of Onset  . Diabetes Mother     SOCIAL HISTORY: Social History   Socioeconomic History  . Marital status: Widowed    Spouse name: Greggory Stallion  . Number of children: 2  . Years of education: 28  . Highest  education level: Not on file  Occupational History  . Occupation: Retired  Tobacco Use  . Smoking status: Never Smoker  . Smokeless tobacco: Current User    Types: Snuff  Substance and Sexual Activity  . Alcohol use: No    Alcohol/week: 0.0 standard drinks  . Drug use: No  . Sexual activity: Not Currently  Other Topics Concern  . Not on file  Social History Narrative   Patient lives alone 03/04/17   Patient has 2 children.    Patient is retired.   Patient is right-handed.     Caffeine use: Drinks 1 cup coffee per day   Social Determinants of Health   Financial Resource Strain:   . Difficulty of Paying Living Expenses: Not on file  Food Insecurity:   . Worried About Programme researcher, broadcasting/film/video in the Last Year: Not on file  . Ran Out of Food in the Last Year: Not on file  Transportation Needs:   . Lack of Transportation (Medical): Not on file  . Lack of Transportation (Non-Medical): Not on file  Physical Activity:   . Days of Exercise per Week: Not on file  . Minutes of Exercise per Session: Not on file  Stress:   . Feeling of Stress : Not on file  Social Connections:   . Frequency of  Communication with Friends and Family: Not on file  . Frequency of Social Gatherings with Friends and Family: Not on file  . Attends Religious Services: Not on file  . Active Member of Clubs or Organizations: Not on file  . Attends Banker Meetings: Not on file  . Marital Status: Not on file  Intimate Partner Violence:   . Fear of Current or Ex-Partner: Not on file  . Emotionally Abused: Not on file  . Physically Abused: Not on file  . Sexually Abused: Not on file      PHYSICAL EXAM  Vitals:   01/17/20 0750  BP: 118/80  Pulse: 83  Temp: (!) 97 F (36.1 C)  TempSrc: Oral  Weight: 194 lb 9.6 oz (88.3 kg)  Height: 5\' 3"  (1.6 m)   Body mass index is 34.47 kg/m.  Generalized: Well developed, in no acute distress   Neurological examination  Mentation: Alert oriented to time, place, history taking. Follows all commands speech and language fluent Cranial nerve II-XII: Pupils were equal round reactive to light. Extraocular movements were full, visual field were full on confrontational test. Facial sensation and strength were normal. Uvula tongue midline. Head turning and shoulder shrug  were normal and symmetric. Motor: The motor testing reveals 5 over 5 strength of all 4 extremities. Good symmetric motor tone is noted throughout.  Sensory: Sensory testing is intact to soft touch on all 4 extremities. No evidence of extinction is noted.  Coordination: Cerebellar testing reveals good finger-nose-finger and heel-to-shin bilaterally.  Mild tremor in the upper extremities with finger-nose-finger Gait and station: Gait is slow and cautious.  She uses a cane when ambulating.  Tandem gait not attempted. Reflexes: Deep tendon reflexes are symmetric and normal bilaterally.   DIAGNOSTIC DATA (LABS, IMAGING, TESTING) - I reviewed patient records, labs, notes, testing and imaging myself where available.  Lab Results  Component Value Date   WBC 3.0 (L) 09/27/2015   HGB 12.6  09/27/2015   HCT 40.4 09/27/2015   MCV 91.0 09/27/2015   PLT 156 09/27/2015      Component Value Date/Time   NA 138 09/27/2015 1159   K 3.7  09/27/2015 1159   CL 105 09/27/2015 1159   CO2 27 09/27/2015 1159   GLUCOSE 103 (H) 09/27/2015 1159   BUN 15 09/27/2015 1159   CREATININE 1.06 (H) 09/27/2015 1159   CALCIUM 10.5 (H) 09/27/2015 1159   PROT 6.2 02/11/2015 1816   ALBUMIN 3.7 02/11/2015 1816   AST 23 02/11/2015 1816   ALT 18 02/11/2015 1816   ALKPHOS 68 02/11/2015 1816   BILITOT 0.2 (L) 02/11/2015 1816   GFRNONAA 49 (L) 09/27/2015 1159   GFRAA 56 (L) 09/27/2015 1159   Lab Results  Component Value Date   CHOL 184 10/17/2014   HDL 44.80 10/17/2014   LDLDIRECT 64.8 10/17/2014   TRIG 311.0 (H) 10/17/2014   CHOLHDL 4 10/17/2014   Lab Results  Component Value Date   HGBA1C 6.6 (H) 02/11/2015   No results found for: VITAMINB12 Lab Results  Component Value Date   TSH 1.830 01/30/2016      ASSESSMENT AND PLAN 86 y.o. year old female  has a past medical history of Abnormal cardiovascular stress test (09/20/2013), Diabetes ([facility]) (09/20/2013), Diabetes mellitus ([facility]), Essential hypertension (09/20/2013), H/O: hysterectomy, Hyperlipidemia (09/20/2013), RA (rheumatoid arthritis) ([facility]), and Sleep apnea. here with :  1.  Essential tremor  -Continue Topamax 25 mg twice a day -Discussed using wrist weights and weighted utensils to help control the tremor  2.  Auditory disturbance  -Unsure if she is having true auditory hallucinations.  -Did discuss medication however we will continue to monitor for now  I spent 25 minutes with the patient.  This time was spent discussing treatment for tremor and auditory disturbance   Butch Penny, MSN, NP-C 01/17/2020, 8:10 AM [facility] [address] [place], [place] [address] [phone]  Made any corrections needed, and agree with history, physical, neuro exam,assessment and plan as stated.      Naomie Dean, MD [facility]

## 2020-01-26 ENCOUNTER — Other Ambulatory Visit: Payer: Self-pay | Admitting: Cardiology

## 2020-03-13 ENCOUNTER — Ambulatory Visit (INDEPENDENT_AMBULATORY_CARE_PROVIDER_SITE_OTHER): Payer: Medicare Other | Admitting: Vascular Surgery

## 2020-03-13 ENCOUNTER — Encounter: Payer: Self-pay | Admitting: Vascular Surgery

## 2020-03-13 ENCOUNTER — Other Ambulatory Visit: Payer: Self-pay

## 2020-03-13 VITALS — BP 135/86 | HR 81 | Temp 97.3°F | Resp 16 | Ht 63.0 in | Wt 193.0 lb

## 2020-03-13 DIAGNOSIS — I739 Peripheral vascular disease, unspecified: Secondary | ICD-10-CM | POA: Diagnosis not present

## 2020-03-13 NOTE — Progress Notes (Signed)
Referring Physician: Tacey Ruiz NP  Patient name: Kristen Weber MRN: [id number] DOB: Aug 12, 1934 Sex: female  REASON FOR CONSULT: right leg pain  HPI: Kristen Weber is a 86 y.o. female, referred for evaluation of right leg pain.  Patient has had pain in her right leg that has been ongoing about 2 years.  It apparently has gotten worse over the last 6 months.  Patient's daughter was present for the office visit who also gave part of the history.  She does not really describe claudication symptoms.  The pain does not seem to be brought on by exercise and is not relieved with rest.  She states that she has continuous pain that begins in her right knee and extends down over the anterior compartment of her right leg.  She states that some days are better than others.  He does take tramadol for the pain.  Sometimes this helps sometimes it does not.  He does have a similar pain in the left side but it is not anything like the right side.  Apparently she does have a history of degenerative joint disease in both knees and has had injections of her knees in the past that did not really help and potentially she felt worse afterwards.  The subject of knee replacement has been discussed.  She does not have rest pain in her right foot.  She does have some numbness and tingling that occurs in both feet that extends up to the ankle on occasion.  This is not all the time.  She has no history of nonhealing wounds.  She apparently had a venous study done by Dr. Ardyth Gal at [facility] about 18 months ago which per report of the patient and her daughter did not really have any findings.  Daughter states that she did have what sounds like a DVT in the right leg about 40 years ago.  Other medical problems include hyperlipidemia, diabetes, hypertension all of which have been stable.  She is on a statin and aspirin.  Past Medical History:  Diagnosis Date  . Abnormal cardiovascular stress test 09/20/2013   Possibly  abnormal nuclear stress at [place]-anteroseptal abnormality interpreted as possible breast attenuation as well, motion artifact. Normal EF. No TID. Watching medically  . Diabetes (HCC) 09/20/2013  . Diabetes mellitus (HCC)    Type 2  . Essential hypertension 09/20/2013  . H/O: hysterectomy   . Hyperlipidemia 09/20/2013  . RA (rheumatoid arthritis) (HCC)   . Sleep apnea    uses cpap   Past Surgical History:  Procedure Laterality Date  . CARDIAC CATHETERIZATION  02/12/2015  . LEFT HEART CATHETERIZATION WITH CORONARY ANGIOGRAM N/A 02/12/2015   Procedure: LEFT HEART CATHETERIZATION WITH CORONARY ANGIOGRAM;  Surgeon: Lennette Bihari, MD;  Location: [facility] CATH LAB;  Service: Cardiovascular;  Laterality: N/A;  . TOTAL ABDOMINAL HYSTERECTOMY      Family History  Problem Relation Age of Onset  . Diabetes Mother     SOCIAL HISTORY: Social History   Socioeconomic History  . Marital status: Widowed    Spouse name: Greggory Stallion  . Number of children: 2  . Years of education: 62  . Highest education level: Not on file  Occupational History  . Occupation: Retired  Tobacco Use  . Smoking status: Never Smoker  . Smokeless tobacco: Current User    Types: Snuff  Substance and Sexual Activity  . Alcohol use: No    Alcohol/week: 0.0 standard drinks  . Drug use: No  . Sexual activity:  Not Currently  Other Topics Concern  . Not on file  Social History Narrative   Patient lives alone 03/04/17   Patient has 2 children.    Patient is retired.   Patient is right-handed.     Caffeine use: Drinks 1 cup coffee per day   Social Determinants of Health   Financial Resource Strain:   . Difficulty of Paying Living Expenses:   Food Insecurity:   . Worried About Charity fundraiser in the Last Year:   . Arboriculturist in the Last Year:   Transportation Needs:   . Film/video editor (Medical):   Marland Kitchen Lack of Transportation (Non-Medical):   Physical Activity:   . Days of Exercise per Week:   .  Minutes of Exercise per Session:   Stress:   . Feeling of Stress :   Social Connections:   . Frequency of Communication with Friends and Family:   . Frequency of Social Gatherings with Friends and Family:   . Attends Religious Services:   . Active Member of Clubs or Organizations:   . Attends Archivist Meetings:   Marland Kitchen Marital Status:   Intimate Partner Violence:   . Fear of Current or Ex-Partner:   . Emotionally Abused:   Marland Kitchen Physically Abused:   . Sexually Abused:     Allergies  Allergen Reactions  . Lisinopril Cough    cough    Current Outpatient Medications  Medication Sig Dispense Refill  . ACCU-CHEK AVIVA PLUS test strip 1 each by Other route 2 (two) times daily.     Marland Kitchen aspirin EC 81 MG tablet Take 1 tablet (81 mg total) by mouth at bedtime. 90 tablet 3  . atorvastatin (LIPITOR) 20 MG tablet TAKE ONE TABLET BY MOUTH EVERY MORNING 90 tablet 2  . Cholecalciferol (VITAMIN D3 PO) Take 850 mg by mouth 2 (two) times daily.    Marland Kitchen diltiazem (CARDIZEM CD) 120 MG 24 hr capsule TAKE ONE CAPSULE BY MOUTH AT BEDTIME 90 capsule 2  . GLIPIZIDE XL 10 MG 24 hr tablet Take 10 mg by mouth daily.     . isosorbide mononitrate (IMDUR) 60 MG 24 hr tablet TAKE ONE TABLET BY MOUTH EVERY MORNING 90 tablet 2  . LANTUS SOLOSTAR 100 UNIT/ML SOPN Inject 28 Units into the skin daily.     Marland Kitchen loratadine (CLARITIN) 10 MG tablet Take 10 mg by mouth daily.    Marland Kitchen losartan (COZAAR) 25 MG tablet TAKE ONE TABLET AT BEDTIME 90 tablet 3  . nitroGLYCERIN (NITROSTAT) 0.4 MG SL tablet Place 1 tablet (0.4 mg total) under the tongue every 5 (five) minutes as needed. 25 tablet 6  . topiramate (TOPAMAX) 25 MG tablet Take 1 tablet (25 mg total) by mouth 2 (two) times daily. 180 tablet 3  . traMADol (ULTRAM-ER) 100 MG 24 hr tablet Take 100 mg by mouth at bedtime. 50 mg during the day  0  . UNABLE TO FIND Take 1 tablet by mouth daily. Med Name: Gas Relief OTC     No current facility-administered medications for this  visit.    ROS:   General:  No weight loss, Fever, chills  HEENT: No recent headaches, no nasal bleeding, no visual changes, no sore throat  Neurologic: No dizziness, blackouts, seizures. No recent symptoms of stroke or mini- stroke. No recent episodes of slurred speech, or temporary blindness.  Cardiac: No recent episodes of chest pain/pressure, no shortness of breath at rest.  No shortness of breath  with exertion.  Denies history of atrial fibrillation or irregular heartbeat  Vascular: No history of rest pain in feet.  No history of claudication.  No history of non-healing ulcer, + history of DVT   Pulmonary: No home oxygen, no productive cough, no hemoptysis,  No asthma or wheezing  Musculoskeletal:  [X]  Arthritis, [ ]  Low back pain,  [X]  Joint pain  Hematologic:No history of hypercoagulable state.  No history of easy bleeding.  No history of anemia  Gastrointestinal: No hematochezia or melena,  No gastroesophageal reflux, no trouble swallowing  Urinary: [ ]  chronic Kidney disease, [ ]  on HD - [ ]  MWF or [ ]  TTHS, [ ]  Burning with urination, [ ]  Frequent urination, [ ]  Difficulty urinating;   Skin: No rashes  Psychological: No history of anxiety,  No history of depression   Physical Examination  There were no vitals filed for this visit.  There is no height or weight on file to calculate BMI.  General:  Alert and oriented, no acute distress HEENT: Normal Neck: No bruit or JVD Pulmonary: Clear to auscultation bilaterally Cardiac: Regular Rate and Rhythm Abdomen: Soft, non-tender, non-distended, no mass Skin: No rash, no ulcer Extremity Pulses:  2+ radial, brachial, femoral, absent popliteal dorsalis pedis, posterior tibial pulses bilaterally Musculoskeletal: No deformity or edema  Neurologic: Upper and lower extremity motor 5/5 and symmetric  DATA:   Duplex and ABIs [facility] 02/16/20 ABI: 0.8  Right common femoral artery is patent with normal triphasic Right profunda  femoral artery is patent with normal waveforms. Right SFA is patent with normal triphasic Doppler waveforms. Right popliteal artery is patent with normal triphasic Doppler waveforms.Proximal right posterior tibial artery has normal triphasic Doppler waveform. Monophasic waveforms in the mid and distal posterior tibial artery. Monophasic waveform in the proximal right anterior tibial artery. Elevated peak systolic in the mid anterior tibial artery, measuring up to 131 cm/sec. Monophasic waveform in the distal anterior tibial artery with a peak systolic velocity of 19 cm/sec.  Left Lower Extremity  ABI: 1.0  Left common femoral artery is patent with normal triphasic Doppler waveforms. Left profunda femoral artery is patent with normal waveform. Left SFA is patent with normal triphasic Doppler waveform. Mild atherosclerotic disease in left popliteal artery. Left popliteal artery is patent with normal triphasic Doppler waveform.  Proximal left posterior tibial artery is patent with normal triphasic Doppler waveform. Mid and distal posterior tibial artery is occluded. Left peroneal artery is patent. Left anterior tibial artery is patent with biphasic waveforms.   ASSESSMENT: With right leg pain.  I do not believe this is necessarily caused by peripheral arterial disease.  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  Her ABIs 0.8 and she does not really describe claudication symptoms.  He has normal flow all the way to the knee level on the right side.  He probably has some mild to moderate narrowing of the anterior tibial and posterior tibial artery on the right leg.  However, I would not consider any intervention for this as the risk of intervention is far higher than any benefit with her current symptoms which I do not believe are related to her arterial occlusive disease.  She has similar findings of tibial disease in her left leg although her  ABI is essentially normal on the left side.   PLAN: Discussed at length with the patient and daughter her ultrasound physical exam findings and symptomatology that is not really consistent  with arterial occlusive disease.  She does have mild peripheral arterial disease and I encouraged her to walk daily to reduce risk of this becoming progressive over time.  I would not consider any arterial intervention unless she develops rest pain in the right foot or nonhealing wounds as this would be a tibial intervention with limited durability and I do not believe would currently relieve any of her symptoms.  Her daughter did have several questions regarding whether or not Percocet versus Ultram may relieve her symptoms better.  We discussed how this affects renal function and I did tell them that these 2 medications have minimal renal effects.  She will discuss her pain medication further with her primary care physician.  I believe most of her symptoms are probably more related to degenerative joint disease.  However, I did emphasize to her that her orthopedic doctor and her primary care physician would be a better overall judge of management of this.  Patient was reassured that she is not at risk of limb loss and has adequate perfusion to both lower extremities.  She will follow-up with me on an as-needed basis if she develops worsening symptoms or nonhealing wounds on her foot.   Fabienne Bruns, MD [facility] Office: [phone] Pager: [phone]

## 2020-03-26 ENCOUNTER — Encounter: Payer: Medicare Other | Admitting: Vascular Surgery

## 2020-05-01 ENCOUNTER — Encounter (INDEPENDENT_AMBULATORY_CARE_PROVIDER_SITE_OTHER): Payer: Self-pay

## 2020-05-01 ENCOUNTER — Ambulatory Visit (INDEPENDENT_AMBULATORY_CARE_PROVIDER_SITE_OTHER): Payer: Medicare Other | Admitting: Cardiology

## 2020-05-01 ENCOUNTER — Other Ambulatory Visit: Payer: Self-pay

## 2020-05-01 ENCOUNTER — Encounter: Payer: Self-pay | Admitting: Cardiology

## 2020-05-01 VITALS — BP 100/70 | HR 91 | Ht 63.0 in | Wt 189.4 lb

## 2020-05-01 DIAGNOSIS — I2510 Atherosclerotic heart disease of native coronary artery without angina pectoris: Secondary | ICD-10-CM

## 2020-05-01 DIAGNOSIS — E782 Mixed hyperlipidemia: Secondary | ICD-10-CM | POA: Diagnosis not present

## 2020-05-01 DIAGNOSIS — I208 Other forms of angina pectoris: Secondary | ICD-10-CM

## 2020-05-01 DIAGNOSIS — I739 Peripheral vascular disease, unspecified: Secondary | ICD-10-CM

## 2020-05-01 NOTE — Progress Notes (Signed)
Cardiology Office Note:    Date:  05/01/2020   ID:  Kristen Weber, DOB 1934/11/02, MRN [id number]  PCP:  Tacey Ruiz, FNP  [facility] Cardiologist:  Donato Schultz, MD  [facility] Electrophysiologist:  None   Referring MD: Tacey Ruiz, FNP    History of Present Illness:    Kristen Weber is a 86 y.o. female with peripheral arterial disease, seen by Dr. 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  Has prior cath in 2016 with CAD not amenable to PCI.  Did not wish to have repeat cath.  For details  Other than knee pain, arthritis, overall doing quite well.    Past Medical History:  Diagnosis Date  . Abnormal cardiovascular stress test 09/20/2013   Possibly abnormal nuclear stress at [place]-anteroseptal abnormality interpreted as possible breast attenuation as well, motion artifact. Normal EF. No TID. Watching medically  . Diabetes (HCC) 09/20/2013  . Diabetes mellitus (HCC)    Type 2  . Essential hypertension 09/20/2013  . H/O: hysterectomy   . Hyperlipidemia 09/20/2013  . RA (rheumatoid arthritis) (HCC)   . Sleep apnea    uses cpap    Past Surgical History:  Procedure Laterality Date  . CARDIAC CATHETERIZATION  02/12/2015  . LEFT HEART CATHETERIZATION WITH CORONARY ANGIOGRAM N/A 02/12/2015   Procedure: LEFT HEART CATHETERIZATION WITH CORONARY ANGIOGRAM;  Surgeon: Lennette Bihari, MD;  Location: [facility] CATH LAB;  Service: Cardiovascular;  Laterality: N/A;  . TOTAL ABDOMINAL HYSTERECTOMY      Current Medications: Current Meds  Medication Sig  . ACCU-CHEK AVIVA PLUS test strip 1 each by Other route 2 (two) times daily.   Marland Kitchen aspirin EC 81 MG tablet Take 1 tablet (81 mg total) by mouth at bedtime.  Marland Kitchen atorvastatin (LIPITOR) 20 MG tablet TAKE ONE TABLET BY MOUTH EVERY MORNING  . Cholecalciferol (VITAMIN D3 PO) Take 850 mg  by mouth 2 (two) times daily.  Marland Kitchen diltiazem (CARDIZEM CD) 120 MG 24 hr capsule TAKE ONE CAPSULE BY MOUTH AT BEDTIME  . Docusate Sodium (DSS) 100 MG CAPS Take 100 mg by mouth as needed.  Marland Kitchen GLIPIZIDE XL 10 MG 24 hr tablet Take 10 mg by mouth daily.   . isosorbide mononitrate (IMDUR) 60 MG 24 hr tablet TAKE ONE TABLET BY MOUTH EVERY MORNING  . LANTUS SOLOSTAR 100 UNIT/ML SOPN Inject 28 Units into the skin daily.   Marland Kitchen loratadine (CLARITIN) 10 MG tablet Take 10 mg by mouth daily.  Marland Kitchen losartan (COZAAR) 25 MG tablet TAKE ONE TABLET AT BEDTIME  . nitroGLYCERIN (NITROSTAT) 0.4 MG SL tablet Place 1 tablet (0.4 mg total) under the tongue every 5 (five) minutes as needed.  . topiramate (TOPAMAX) 25 MG tablet Take 1 tablet (25 mg total) by mouth 2 (two) times daily.  . traMADol (ULTRAM-ER) 100 MG 24 hr tablet Take 100 mg by mouth at bedtime. 50 mg during the day  . UNABLE TO FIND Take 1 tablet by mouth daily. Med Name: Gas Relief OTC     Allergies:   Lisinopril   Social History   Socioeconomic History  . Marital status: Widowed    Spouse name: Greggory Stallion  . Number of children: 2  . Years of education: 70  . Highest education level: Not on file  Occupational History  . Occupation: Retired  Tobacco Use  . Smoking status: Never Smoker  . Smokeless tobacco: Current User  Types: Snuff  Substance and Sexual Activity  . Alcohol use: No    Alcohol/week: 0.0 standard drinks  . Drug use: No  . Sexual activity: Not Currently  Other Topics Concern  . Not on file  Social History Narrative   Patient lives alone 03/04/17   Patient has 2 children.    Patient is retired.   Patient is right-handed.     Caffeine use: Drinks 1 cup coffee per day   Social Determinants of Health   Financial Resource Strain:   . Difficulty of Paying Living Expenses:   Food Insecurity:   . Worried About Programme researcher, broadcasting/film/video in the Last Year:   . Barista in the Last Year:   Transportation Needs:   . Automotive engineer (Medical):   Marland Kitchen Lack of Transportation (Non-Medical):   Physical Activity:   . Days of Exercise per Week:   . Minutes of Exercise per Session:   Stress:   . Feeling of Stress :   Social Connections:   . Frequency of Communication with Friends and Family:   . Frequency of Social Gatherings with Friends and Family:   . Attends Religious Services:   . Active Member of Clubs or Organizations:   . Attends Banker Meetings:   Marland Kitchen Marital Status:      Family History: The patient's family history includes Diabetes in her mother.  ROS:   Please see the history of present illness.     All other systems reviewed and are negative.  EKGs/Labs/Other Studies Reviewed:    The following studies were reviewed today: See below    Recent Labs: No results found for requested labs within last 8760 hours.  Recent Lipid Panel    Component Value Date/Time   CHOL 184 10/17/2014 0946   TRIG 311.0 (H) 10/17/2014 0946   HDL 44.80 10/17/2014 0946   CHOLHDL 4 10/17/2014 0946   VLDL 62.2 (H) 10/17/2014 0946   LDLDIRECT 64.8 10/17/2014 0946    Physical Exam:    VS:  BP 100/70   Pulse 91   Ht 5\' 3"  (1.6 m)   Wt 189 lb 6.4 oz (85.9 kg)   SpO2 97%   BMI 33.55 kg/m     Wt Readings from Last 3 Encounters:  05/01/20 189 lb 6.4 oz (85.9 kg)  03/13/20 193 lb (87.5 kg)  01/17/20 194 lb 9.6 oz (88.3 kg)     GEN: Walker, Well nourished, well developed in no acute distress HEENT: Normal NECK: No JVD; No carotid bruits LYMPHATICS: No lymphadenopathy CARDIAC: RRR, no murmurs, rubs, gallops RESPIRATORY:  Clear to auscultation without rales, wheezing or rhonchi  ABDOMEN: Soft, non-tender, non-distended MUSCULOSKELETAL:  No edema; No deformity  SKIN: Warm and dry NEUROLOGIC:  Alert and oriented x 3 PSYCHIATRIC:  Normal affect   ASSESSMENT:    1. Coronary artery disease involving native coronary artery of native heart without angina pectoris   2. Mixed hyperlipidemia     3. Stable angina (HCC)   4. PAD (peripheral artery disease) (HCC)    PLAN:    In order of problems listed above:  Coronary artery disease -Cardiac catheterization 2016 demonstrated moderate diffuse disease. -Mild proximal calcification of LAD -80% stenosis of very small ramus branch not amenable to PCI -40% circumflex -50% mid PDA stenosis. -Continue with aggressive secondary risk factor prevention. -Was previously offered repeat catheterization but declined.  Angina -Isosorbide previously increased.  Helped her symptoms.  No headaches.  Diabetes with  hypertension -Hemoglobin A1c down to 6.4 previously.  Overall doing well.  Angiotensin receptor blocker for renal protection.  Hyperlipidemia -Continue with high intensity statin therapy with CAD, diabetes, PAD  Peripheral arterial disease -Dr. Oneida Alar vascular surgery note reviewed.  Medical management.  She is not at high risk for limb loss.  I explained to her that if a wound was nonhealing that plans may change but for now continue with activity, walking.  Recent UTI -Just finished antibiotics.  Did feel some fatigue with this.  Her daughter asked if there was any side effects to the cardiac medication she is taking that could be contributing to fatigue.  She is not on a beta-blocker.  Diltiazem should not cause this.  Continue.   Medication Adjustments/Labs and Tests Ordered: Current medicines are reviewed at length with the patient today.  Concerns regarding medicines are outlined above.  No orders of the defined types were placed in this encounter.  No orders of the defined types were placed in this encounter.   Patient Instructions  Medication Instructions:  Your physician recommends that you continue on your current medications as directed. Please refer to the Current Medication list given to you today.  *If you need a refill on your cardiac medications before your next appointment, please call your  pharmacy*  Follow-Up: At [facility], you and your health needs are our priority.  As part of our continuing mission to provide you with exceptional heart care, we have created designated Provider Care Teams.  These Care Teams include your primary Cardiologist (physician) and Advanced Practice Providers (APPs -  Physician Assistants and Nurse Practitioners) who all work together to provide you with the care you need, when you need it.  We recommend signing up for the patient portal called "MyChart".  Sign up information is provided on this After Visit Summary.  MyChart is used to connect with patients for Virtual Visits (Telemedicine).  Patients are able to view lab/test results, encounter notes, upcoming appointments, etc.  Non-urgent messages can be sent to your provider as well.   To learn more about what you can do with MyChart, go to [email or web address].    Your next appointment:   1 year(s)  The format for your next appointment:   In Person  Provider:   You may see Candee Furbish, MD or one of the following Advanced Practice Providers on your designated Care Team:    Truitt Merle, NP  Cecilie Kicks, NP  Kathyrn Drown, NP       Signed, Candee Furbish, MD  05/01/2020 9:36 AM    [place]

## 2020-05-01 NOTE — Patient Instructions (Signed)
Medication Instructions:  Your physician recommends that you continue on your current medications as directed. Please refer to the Current Medication list given to you today.  *If you need a refill on your cardiac medications before your next appointment, please call your pharmacy*  Follow-Up: At [facility], you and your health needs are our priority.  As part of our continuing mission to provide you with exceptional heart care, we have created designated Provider Care Teams.  These Care Teams include your primary Cardiologist (physician) and Advanced Practice Providers (APPs -  Physician Assistants and Nurse Practitioners) who all work together to provide you with the care you need, when you need it.  We recommend signing up for the patient portal called "MyChart".  Sign up information is provided on this After Visit Summary.  MyChart is used to connect with patients for Virtual Visits (Telemedicine).  Patients are able to view lab/test results, encounter notes, upcoming appointments, etc.  Non-urgent messages can be sent to your provider as well.   To learn more about what you can do with MyChart, go to [email or web address].    Your next appointment:   1 year(s)  The format for your next appointment:   In Person  Provider:   You may see Donato Schultz, MD or one of the following Advanced Practice Providers on your designated Care Team:    Norma Fredrickson, NP  Nada Boozer, NP  Georgie Chard, NP

## 2020-07-02 ENCOUNTER — Other Ambulatory Visit: Payer: Self-pay | Admitting: Neurology

## 2020-07-16 ENCOUNTER — Ambulatory Visit (INDEPENDENT_AMBULATORY_CARE_PROVIDER_SITE_OTHER): Payer: Medicare Other | Admitting: Adult Health

## 2020-07-16 VITALS — BP 124/84 | HR 83 | Ht 63.0 in | Wt 190.0 lb

## 2020-07-16 DIAGNOSIS — G250 Essential tremor: Secondary | ICD-10-CM

## 2020-07-16 MED ORDER — TOPIRAMATE 25 MG PO TABS: ORAL_TABLET | ORAL | 3 refills | Status: DC

## 2020-07-16 NOTE — Progress Notes (Addendum)
PATIENT: Kristen Weber DOB: [birth year]  REASON FOR VISIT: follow up HISTORY FROM: patient  HISTORY OF PRESENT ILLNESS: Today 07/16/20:  Kristen Weber is an 86 year old female with a history of essential tremor.  She returns today for follow-up.  She feels that her tremors may be slightly worse.  She only notices it when she is trying to tote an object or when she is writing.  She states that her tremor is much worse if she is anxious.  Daughter reports that she does have some issues with anxiety.  Patient reports that she is able to feed herself.  She dresses herself.  No trouble buttoning buttons.  She returns today for an evaluation.  Tried and failed Sinemet, propranolol, and gabapentin and primidone  HISTORY 01/17/20:  Kristen Weber is an 86 year old female with a history of essential tremor.  She returns today for follow-up.  She feels that her tremors may be worse.  She does not have any trouble feeding herself.  She does notice it with her hand rains.  Feels that the tremor in the hands get worse when she is upset.  She has tried Sinemet, propranolol, Neurontin, primidone but was unable to tolerate these medications.  She is currently on Topamax but could not tolerate at higher dose.  Her daughter is with her today.  She reports that occasionally her mom reports seeing shadows in the house.  Occasionally she may hear someone calling her name or knocking at the door.  She also notices that she has trouble with her balance.  She does use a cane.  She recently got injections in the right knee.  She does have ongoing knee pain.  REVIEW OF SYSTEMS: Out of a complete 14 system review of symptoms, the patient complains only of the following symptoms, and all other reviewed systems are negative.  See HPI  ALLERGIES: Allergies  Allergen Reactions  . Lisinopril Cough    cough    HOME MEDICATIONS: Outpatient Medications Prior to Visit  Medication Sig Dispense Refill  . ACCU-CHEK AVIVA  PLUS test strip 1 each by Other route 2 (two) times daily.     Marland Kitchen aspirin EC 81 MG tablet Take 1 tablet (81 mg total) by mouth at bedtime. 90 tablet 3  . atorvastatin (LIPITOR) 20 MG tablet TAKE ONE TABLET BY MOUTH EVERY MORNING 90 tablet 2  . Cholecalciferol (VITAMIN D3 PO) Take 850 mg by mouth 2 (two) times daily.    Marland Kitchen diltiazem (CARDIZEM CD) 120 MG 24 hr capsule TAKE ONE CAPSULE BY MOUTH AT BEDTIME 90 capsule 2  . Docusate Sodium (DSS) 100 MG CAPS Take 100 mg by mouth as needed.    Marland Kitchen GLIPIZIDE XL 10 MG 24 hr tablet Take 10 mg by mouth daily.     . isosorbide mononitrate (IMDUR) 60 MG 24 hr tablet TAKE ONE TABLET BY MOUTH EVERY MORNING 90 tablet 2  . LANTUS SOLOSTAR 100 UNIT/ML SOPN Inject 28 Units into the skin daily.     Marland Kitchen loratadine (CLARITIN) 10 MG tablet Take 10 mg by mouth daily.    Marland Kitchen losartan (COZAAR) 25 MG tablet TAKE ONE TABLET AT BEDTIME 90 tablet 3  . nitroGLYCERIN (NITROSTAT) 0.4 MG SL tablet Place 1 tablet (0.4 mg total) under the tongue every 5 (five) minutes as needed. 25 tablet 6  . topiramate (TOPAMAX) 25 MG tablet TAKE ONE TABLET BY MOUTH EVERY MORNING and TAKE ONE TABLET AT BEDTIME 180 tablet 0  . traMADol (ULTRAM-ER) 100 MG 24  hr tablet Take 100 mg by mouth at bedtime. 50 mg during the day  0  . UNABLE TO FIND Take 1 tablet by mouth daily. Med Name: Gas Relief OTC     No facility-administered medications prior to visit.    PAST MEDICAL HISTORY: Past Medical History:  Diagnosis Date  . Abnormal cardiovascular stress test 09/20/2013   Possibly abnormal nuclear stress at [facility]-anteroseptal abnormality interpreted as possible breast attenuation as well, motion artifact. Normal EF. No TID. Watching medically  . Diabetes (HCC) 09/20/2013  . Diabetes mellitus (HCC)    Type 2  . Essential hypertension 09/20/2013  . H/O: hysterectomy   . Hyperlipidemia 09/20/2013  . RA (rheumatoid arthritis) (HCC)   . Sleep apnea    uses cpap    PAST SURGICAL HISTORY: Past Surgical  History:  Procedure Laterality Date  . CARDIAC CATHETERIZATION  02/12/2015  . LEFT HEART CATHETERIZATION WITH CORONARY ANGIOGRAM N/A 02/12/2015   Procedure: LEFT HEART CATHETERIZATION WITH CORONARY ANGIOGRAM;  Surgeon: Lennette Bihari, MD;  Location: [facility] CATH LAB;  Service: Cardiovascular;  Laterality: N/A;  . TOTAL ABDOMINAL HYSTERECTOMY      FAMILY HISTORY: Family History  Problem Relation Age of Onset  . Diabetes Mother     SOCIAL HISTORY: Social History   Socioeconomic History  . Marital status: Widowed    Spouse name: Greggory Stallion  . Number of children: 2  . Years of education: 50  . Highest education level: Not on file  Occupational History  . Occupation: Retired  Tobacco Use  . Smoking status: Never Smoker  . Smokeless tobacco: Current User    Types: Snuff  Vaping Use  . Vaping Use: Never used  Substance and Sexual Activity  . Alcohol use: No    Alcohol/week: 0.0 standard drinks  . Drug use: No  . Sexual activity: Not Currently  Other Topics Concern  . Not on file  Social History Narrative   Patient lives alone 03/04/17   Patient has 2 children.    Patient is retired.   Patient is right-handed.     Caffeine use: Drinks 1 cup coffee per day   Social Determinants of Health   Financial Resource Strain:   . Difficulty of Paying Living Expenses: Not on file  Food Insecurity:   . Worried About Programme researcher, broadcasting/film/video in the Last Year: Not on file  . Ran Out of Food in the Last Year: Not on file  Transportation Needs:   . Lack of Transportation (Medical): Not on file  . Lack of Transportation (Non-Medical): Not on file  Physical Activity:   . Days of Exercise per Week: Not on file  . Minutes of Exercise per Session: Not on file  Stress:   . Feeling of Stress : Not on file  Social Connections:   . Frequency of Communication with Friends and Family: Not on file  . Frequency of Social Gatherings with Friends and Family: Not on file  . Attends Religious Services: Not on  file  . Active Member of Clubs or Organizations: Not on file  . Attends Banker Meetings: Not on file  . Marital Status: Not on file  Intimate Partner Violence:   . Fear of Current or Ex-Partner: Not on file  . Emotionally Abused: Not on file  . Physically Abused: Not on file  . Sexually Abused: Not on file      PHYSICAL EXAM  Vitals:   07/16/20 0752  BP: 124/84  Pulse: 83  Weight:  190 lb (86.2 kg)  Height: 5\' 3"  (1.6 m)   Body mass index is 33.66 kg/m.  Generalized: Well developed, in no acute distress   Neurological examination  Mentation: Alert oriented to time, place, history taking. Follows all commands speech and language fluent Cranial nerve II-XII: Pupils were equal round reactive to light. Extraocular movements were full, visual field were full on confrontational test. Head turning and shoulder shrug  were normal and symmetric. Motor: The motor testing reveals 5 over 5 strength of all 4 extremities. Good symmetric motor tone is noted throughout.  Sensory: Sensory testing is intact to soft touch on all 4 extremities. No evidence of extinction is noted.  Coordination: Cerebellar testing reveals good finger-nose-finger and heel-to-shin bilaterally.  Mild tremor with finger-nose-finger.  No significant tremor when drawing spiral. Gait and station: Gait is slightly unsteady.  Uses a Rollator.  Tandem gait is not attempted. Reflexes: Deep tendon reflexes are symmetric and normal bilaterally.   DIAGNOSTIC DATA (LABS, IMAGING, TESTING) - I reviewed patient records, labs, notes, testing and imaging myself where available.  Lab Results  Component Value Date   WBC 3.0 (L) 09/27/2015   HGB 12.6 09/27/2015   HCT 40.4 09/27/2015   MCV 91.0 09/27/2015   PLT 156 09/27/2015      Component Value Date/Time   NA 138 09/27/2015 1159   K 3.7 09/27/2015 1159   CL 105 09/27/2015 1159   CO2 27 09/27/2015 1159   GLUCOSE 103 (H) 09/27/2015 1159   BUN 15 09/27/2015  1159   CREATININE 1.06 (H) 09/27/2015 1159   CALCIUM 10.5 (H) 09/27/2015 1159   PROT 6.2 02/11/2015 1816   ALBUMIN 3.7 02/11/2015 1816   AST 23 02/11/2015 1816   ALT 18 02/11/2015 1816   ALKPHOS 68 02/11/2015 1816   BILITOT 0.2 (L) 02/11/2015 1816   GFRNONAA 49 (L) 09/27/2015 1159   GFRAA 56 (L) 09/27/2015 1159   Lab Results  Component Value Date   CHOL 184 10/17/2014   HDL 44.80 10/17/2014   LDLDIRECT 64.8 10/17/2014   TRIG 311.0 (H) 10/17/2014   CHOLHDL 4 10/17/2014   Lab Results  Component Value Date   HGBA1C 6.6 (H) 02/11/2015   No results found for: [id number] Lab Results  Component Value Date   TSH 1.830 01/30/2016      ASSESSMENT AND PLAN 86 y.o. year old female  has a past medical history of Abnormal cardiovascular stress test (09/20/2013), Diabetes (HCC) (09/20/2013), Diabetes mellitus (HCC), Essential hypertension (09/20/2013), H/O: hysterectomy, Hyperlipidemia (09/20/2013), RA (rheumatoid arthritis) (HCC), and Sleep apnea. here with:  Essential tremor   Continue Topamax 25 mg twice a day  Advised if symptoms worsen she should let us know.  In regards to anxiety advised that they should discuss with PCP  Follow-up in 1 year or sooner if needed   I spent 20 minutes of face-to-face and non-face-to-face time with patient.  This included previsit chart review, lab review, study review, order entry, electronic health record documentation, patient education.  Butch Penny, MSN, NP-C 07/16/2020, 7:55 AM [facility] [address], [address] [place], [place] [address] [phone]  Made any corrections needed, and agree with history, physical, neuro exam,assessment and plan as stated.     Naomie Dean, MD [facility]

## 2020-07-16 NOTE — Patient Instructions (Signed)
Your Plan:  Continue Topamax If your symptoms worsen or you develop new symptoms please let us know.   Thank you for coming to see us at [facility]. I hope we have been able to provide you high quality care today.  You may receive a patient satisfaction survey over the next few weeks. We would appreciate your feedback and comments so that we may continue to improve ourselves and the health of our patients.  

## 2020-09-25 ENCOUNTER — Telehealth: Payer: Self-pay | Admitting: Cardiology

## 2020-09-25 NOTE — Telephone Encounter (Signed)
Patient's daughter is calling to make Dr. Anne Fu aware that the patient recently had a stroke. She is requesting a call back to further discuss.

## 2020-09-25 NOTE — Telephone Encounter (Signed)
spoke with daughter Steward Drone (OK per [facility]).  Pt had a TIA on 09/16/2020 and was seen at a [facility] facility.  She was started on Plavix 75 mg daily and advised to f/u with PCP for further treatment.  She is scheduled to see PCP 10/03/2020.  She is also scheduled for further testing prior to then.  Daughter is asking if pt needs to f/u with Dr Anne Fu d/t this.  Advised generally patients f/u with neurology for treatment of TIA/CVA depending on the cause.  Advised to f/u with scheduled testing and PCP.  Pt currently has an appt with Dr Anne Fu scheduled for 10/11/2020.  Daughter would like to know if pt needs this appt (not due for f/u until 04/2021) since she has appointments for testing and PCP.  Advised to keep those appts as scheduled.  Advised PCP will determine the cause of the TIA refer pt to the correct MD for treatment.  Advised to keep the appt with Dr Anne Fu as scheduled for now and can cancel later if determined not to need it.  Also advised I will have Dr Anne Fu review and call her back with his recommendations.

## 2020-09-26 NOTE — Telephone Encounter (Signed)
Agree. Recommend PCP follow up with neurology consult likely from there as directed by PCP.  OK to defer the appt with me on 10/11/20 until later since she is seeing her PCP.   Donato Schultz, MD

## 2020-09-26 NOTE — Telephone Encounter (Signed)
Spoke with Steward Drone about Dr Anne Fu recommendations.  She is aware to have pt f/u with PCP and nuero but to deter appt with him for now.  She was grateful for the call back information.

## 2020-10-11 ENCOUNTER — Other Ambulatory Visit: Payer: Self-pay

## 2020-10-11 ENCOUNTER — Ambulatory Visit (INDEPENDENT_AMBULATORY_CARE_PROVIDER_SITE_OTHER): Payer: Medicare Other | Admitting: Cardiology

## 2020-10-11 ENCOUNTER — Encounter: Payer: Self-pay | Admitting: Cardiology

## 2020-10-11 VITALS — BP 116/90 | HR 85 | Ht 63.0 in | Wt 187.0 lb

## 2020-10-11 DIAGNOSIS — I208 Other forms of angina pectoris: Secondary | ICD-10-CM

## 2020-10-11 DIAGNOSIS — I2510 Atherosclerotic heart disease of native coronary artery without angina pectoris: Secondary | ICD-10-CM

## 2020-10-11 DIAGNOSIS — I739 Peripheral vascular disease, unspecified: Secondary | ICD-10-CM

## 2020-10-11 DIAGNOSIS — E782 Mixed hyperlipidemia: Secondary | ICD-10-CM | POA: Diagnosis not present

## 2020-10-11 DIAGNOSIS — I10 Essential (primary) hypertension: Secondary | ICD-10-CM

## 2020-10-11 DIAGNOSIS — E119 Type 2 diabetes mellitus without complications: Secondary | ICD-10-CM

## 2020-10-11 DIAGNOSIS — G459 Transient cerebral ischemic attack, unspecified: Secondary | ICD-10-CM

## 2020-10-11 NOTE — Progress Notes (Signed)
Cardiology Office Note:    Date:  10/11/2020   ID:  [name], DOB Apr 19, 1934, MRN [id number]  PCP:  Tacey Ruiz, FNP  [facility] Cardiologist:  Donato Schultz, MD  [facility] Electrophysiologist:  None   Referring MD: Tacey Ruiz, FNP     History of Present Illness:    Kristen Weber is a 86 y.o. female here for the follow-up of peripheral vascular disease, coronary artery disease.  Cardiac catheterization 2016-CAD not amenable to PCI. -Moderate diffuse disease with mild proximal calcification of LAD -80% stenosis a very small ramus branch not amenable to PCI -40% circumflex -50% mid PDA stenosis  Aggressive medical management was recommended at that time in 2016.  Repeat cardiac catheterization was discussed at 1 point patient declined.  She has also been seen by vascular surgery, Dr. 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  She had an ER visit with TIA in October 2021.  Carotids vertebrals and subclavian's were patent.  MRI showed chronic small vessel ischemic disease but no masses or no acute infarction.  Echocardiogram showed EF of 55 to 60% with grade 2 diastolic dysfunction mildly thickened mitral leaflets mildly dilated left atrium normal right ventricle.  Leg pain constant. Tired.  No further strokelike symptoms.  No anginal symptoms with activity.  Overall doing well.  Worried about blood sugars.  Sometimes in the 60s.  Daughter with her today.  Past Medical History:  Diagnosis Date  . Abnormal cardiovascular stress test 09/20/2013   Possibly abnormal nuclear stress at [facility]-anteroseptal abnormality interpreted as possible breast attenuation as well, motion artifact. Normal EF. No TID. Watching medically  . Diabetes (HCC) 09/20/2013  . Diabetes mellitus (HCC)    Type 2  . Essential hypertension  09/20/2013  . H/O: hysterectomy   . Hyperlipidemia 09/20/2013  . RA (rheumatoid arthritis) (HCC)   . Sleep apnea    uses cpap    Past Surgical History:  Procedure Laterality Date  . CARDIAC CATHETERIZATION  02/12/2015  . LEFT HEART CATHETERIZATION WITH CORONARY ANGIOGRAM N/A 02/12/2015   Procedure: LEFT HEART CATHETERIZATION WITH CORONARY ANGIOGRAM;  Surgeon: Lennette Bihari, MD;  Location: [facility] CATH LAB;  Service: Cardiovascular;  Laterality: N/A;  . TOTAL ABDOMINAL HYSTERECTOMY      Current Medications: Current Meds  Medication Sig  . ACCU-CHEK AVIVA PLUS test strip 1 each by Other route 2 (two) times daily.   Marland Kitchen aspirin EC 81 MG tablet Take 1 tablet (81 mg total) by mouth at bedtime.  Marland Kitchen atorvastatin (LIPITOR) 20 MG tablet TAKE ONE TABLET BY MOUTH EVERY MORNING  . Cholecalciferol (VITAMIN D3 PO) Take 850 mg by mouth 2 (two) times daily.  . clopidogrel (PLAVIX) 75 MG tablet Take 75 mg by mouth daily.  Marland Kitchen diltiazem (CARDIZEM CD) 120 MG 24 hr capsule TAKE ONE CAPSULE BY MOUTH AT BEDTIME  . Docusate Sodium (DSS) 100 MG CAPS Take 100 mg by mouth as needed.  Marland Kitchen GLIPIZIDE XL 10 MG 24 hr tablet Take 10 mg by mouth daily.   . isosorbide mononitrate (IMDUR) 60 MG 24 hr tablet TAKE ONE TABLET BY MOUTH EVERY MORNING  . LANTUS SOLOSTAR 100 UNIT/ML SOPN Inject 28 Units into the skin daily.   Marland Kitchen loratadine (CLARITIN) 10 MG tablet Take 10 mg by mouth daily.  Marland Kitchen losartan (COZAAR) 25 MG tablet TAKE ONE TABLET AT BEDTIME  . nitroGLYCERIN (NITROSTAT) 0.4 MG  SL tablet Place 1 tablet (0.4 mg total) under the tongue every 5 (five) minutes as needed.  Marland Kitchen oxyCODONE-acetaminophen (PERCOCET/ROXICET) 5-325 MG tablet Take 1 tablet by mouth 2 (two) times daily as needed.  . topiramate (TOPAMAX) 25 MG tablet TAKE ONE TABLET BY MOUTH EVERY MORNING and TAKE ONE TABLET AT BEDTIME  . traMADol (ULTRAM) 50 MG tablet Take 100 mg by mouth daily as needed.  . traMADol (ULTRAM-ER) 100 MG 24 hr tablet Take 100 mg by mouth at  bedtime. 50 mg during the day  . UNABLE TO FIND Take 1 tablet by mouth daily. Med Name: Gas Relief OTC     Allergies:   Lisinopril   Social History   Socioeconomic History  . Marital status: Widowed    Spouse name: Greggory Stallion  . Number of children: 2  . Years of education: 52  . Highest education level: Not on file  Occupational History  . Occupation: Retired  Tobacco Use  . Smoking status: Never Smoker  . Smokeless tobacco: Current User    Types: Snuff  Vaping Use  . Vaping Use: Never used  Substance and Sexual Activity  . Alcohol use: No    Alcohol/week: 0.0 standard drinks  . Drug use: No  . Sexual activity: Not Currently  Other Topics Concern  . Not on file  Social History Narrative   Patient lives alone 03/04/17   Patient has 2 children.    Patient is retired.   Patient is right-handed.     Caffeine use: Drinks 1 cup coffee per day   Social Determinants of Health   Financial Resource Strain:   . Difficulty of Paying Living Expenses: Not on file  Food Insecurity:   . Worried About Programme researcher, broadcasting/film/video in the Last Year: Not on file  . Ran Out of Food in the Last Year: Not on file  Transportation Needs:   . Lack of Transportation (Medical): Not on file  . Lack of Transportation (Non-Medical): Not on file  Physical Activity:   . Days of Exercise per Week: Not on file  . Minutes of Exercise per Session: Not on file  Stress:   . Feeling of Stress : Not on file  Social Connections:   . Frequency of Communication with Friends and Family: Not on file  . Frequency of Social Gatherings with Friends and Family: Not on file  . Attends Religious Services: Not on file  . Active Member of Clubs or Organizations: Not on file  . Attends Banker Meetings: Not on file  . Marital Status: Not on file     Family History: The patient's family history includes Diabetes in her mother.  ROS:   Please see the history of present illness.     All other systems reviewed  and are negative.  EKGs/Labs/Other Studies Reviewed:    The following studies were reviewed today: As above, outside hospital records reviewed, lab work reviewed from outside echocardiogram etc.  EKG:  EKG is  ordered today.  The ekg ordered today demonstrates sinus rhythm 85 with no other abnormalities.  Recent Labs: No results found for requested labs within last 8760 hours.  Recent Lipid Panel    Component Value Date/Time   CHOL 184 10/17/2014 0946   TRIG 311.0 (H) 10/17/2014 0946   HDL 44.80 10/17/2014 0946   CHOLHDL 4 10/17/2014 0946   VLDL 62.2 (H) 10/17/2014 0946   LDLDIRECT 64.8 10/17/2014 0946     Risk Assessment/Calculations:  Physical Exam:    VS:  BP 116/90   Pulse 85   Ht  (1.6 m)   Wt 187 lb (84.8 kg)   SpO2 95%   BMI 33.13 kg/m     Wt Readings from Last 3 Encounters:  10/11/20 187 lb (84.8 kg)  07/16/20 190 lb (86.2 kg)  05/01/20 189 lb 6.4 oz (85.9 kg)     GEN:  Well nourished, well developed in no acute distress HEENT: Normal NECK: No JVD; No carotid bruits LYMPHATICS: No lymphadenopathy CARDIAC: RRR, no murmurs, rubs, gallops RESPIRATORY:  Clear to auscultation without rales, wheezing or rhonchi  ABDOMEN: Soft, non-tender, non-distended MUSCULOSKELETAL:  No edema; No deformity  SKIN: Warm and dry NEUROLOGIC:  Alert and oriented x 3 PSYCHIATRIC:  Normal affect   ASSESSMENT:    1. Coronary artery disease involving native coronary artery of native heart without angina pectoris   2. Mixed hyperlipidemia   3. Stable angina (HCC)   4. PAD (peripheral artery disease) (HCC)   5. TIA (transient ischemic attack)   6. Diabetes mellitus with coincident hypertension (HCC)    PLAN:    In order of problems listed above:  TIA -October 2021-[facility], work-up reassuring see above. -Continue with aggressive risk factor modification.  On aspirin and Plavix.  Taking the Plavix for 3 weeks total.  This is fine.  Coronary artery  disease -Moderate coronary artery disease seen on cath in 2016.  Continue with aggressive medical management, risk factor modification.  Encouraged her daily exercise.  This will help with fatigue as well.  Angina -Isosorbide.  Tolerating well.  No anginal symptoms.  No headaches.  Continue with current medical management.  Hyperlipidemia -High intensity statin therapy for CAD, PAD, prior TIA, diabetes.  Goal LDL less than 70.  No myalgias.  Continue. LDL 66.   Peripheral arterial disease -Reviewed prior notes from Dr. Darrick Penna, vascular at [facility].  Continue with exercise medical management.  Diabetes with hypertension -Diastolic slightly elevated today.  Continue to monitor.  She was concerned about some blood sugar levels in the 60s at times at night.  She did ask if I would not mind referring her to an endocrinologist.  We will go ahead and place referral for her at her and her daughter's request.   Shared Decision Making/Informed Consent        Medication Adjustments/Labs and Tests Ordered: Current medicines are reviewed at length with the patient today.  Concerns regarding medicines are outlined above.  Orders Placed This Encounter  Procedures  . Ambulatory referral to Endocrinology   No orders of the defined types were placed in this encounter.   Patient Instructions  Medication Instructions:  The current medical regimen is effective;  continue present plan and medications. You may discontinue your Plavix in 3 weeks as instructed.  *If you need a refill on your cardiac medications before your next appointment, please call your pharmacy*  You have been referred to Endocrinology for treatment of DM.  You will be contacted to be scheduled.   Follow-Up: At [facility], you and your health needs are our priority.  As part of our continuing mission to provide you with exceptional heart care, we have created designated Provider Care Teams.  These Care Teams include your primary  Cardiologist (physician) and Advanced Practice Providers (APPs -  Physician Assistants and Nurse Practitioners) who all work together to provide you with the care you need, when you need it.  We recommend signing up for the  patient portal called "MyChart".  Sign up information is provided on this After Visit Summary.  MyChart is used to connect with patients for Virtual Visits (Telemedicine).  Patients are able to view lab/test results, encounter notes, upcoming appointments, etc.  Non-urgent messages can be sent to your provider as well.   To learn more about what you can do with MyChart, go to [email or web address].    Your next appointment:   12 month(s)  The format for your next appointment:   In Person  Provider:   Donato Schultz, MD  Thank you for choosing [facility]!!          Signed, Donato Schultz, MD  10/11/2020 9:44 AM    [facility]

## 2020-10-11 NOTE — Addendum Note (Signed)
Addended by: Louanne Belton, Quadir Muns A on: 10/11/2020 05:20 PM   Modules accepted: Orders

## 2020-10-11 NOTE — Patient Instructions (Addendum)
Medication Instructions:  The current medical regimen is effective;  continue present plan and medications. You may discontinue your Plavix in 3 weeks as instructed.  *If you need a refill on your cardiac medications before your next appointment, please call your pharmacy*  You have been referred to Endocrinology for treatment of DM.  You will be contacted to be scheduled.   Follow-Up: At [facility], you and your health needs are our priority.  As part of our continuing mission to provide you with exceptional heart care, we have created designated Provider Care Teams.  These Care Teams include your primary Cardiologist (physician) and Advanced Practice Providers (APPs -  Physician Assistants and Nurse Practitioners) who all work together to provide you with the care you need, when you need it.  We recommend signing up for the patient portal called "MyChart".  Sign up information is provided on this After Visit Summary.  MyChart is used to connect with patients for Virtual Visits (Telemedicine).  Patients are able to view lab/test results, encounter notes, upcoming appointments, etc.  Non-urgent messages can be sent to your provider as well.   To learn more about what you can do with MyChart, go to [email or web address].    Your next appointment:   12 month(s)  The format for your next appointment:   In Person  Provider:   Donato Schultz, MD  Thank you for choosing [facility]!!

## 2020-10-31 ENCOUNTER — Telehealth: Payer: Self-pay | Admitting: Adult Health

## 2020-10-31 NOTE — Telephone Encounter (Signed)
Pt's daughter Dyane Dustman on [place] called and LVM wanting an RN to call her back. She thinks the pt is having some issues with her medication. Please advise.

## 2020-10-31 NOTE — Telephone Encounter (Signed)
I called daughter of pt and pt is having issues of hearing things that are not there or seeing things that are not there.  Also problem with sleeping,  Pt lives alone in home after husband passed, daughter lives next door. Thinks that topamax is causing issue.  I relayed that pt has been on topamax since 2017 at 25mg  po qhs.  No mention of issue when last in.  [facility] cpap with pcp after sleep study.  I relayed to continue to be in touch with pcp for evaluation for possible causes.  If feels like neuro to be involved to refer to for new problem.  She verbalized understanding.

## 2020-11-12 ENCOUNTER — Other Ambulatory Visit: Payer: Self-pay | Admitting: Cardiology

## 2021-01-03 ENCOUNTER — Ambulatory Visit (INDEPENDENT_AMBULATORY_CARE_PROVIDER_SITE_OTHER): Payer: Medicare Other | Admitting: Internal Medicine

## 2021-01-03 ENCOUNTER — Encounter: Payer: Self-pay | Admitting: Internal Medicine

## 2021-01-03 ENCOUNTER — Other Ambulatory Visit: Payer: Self-pay

## 2021-01-03 VITALS — BP 118/82 | HR 68 | Ht 63.0 in | Wt 192.2 lb

## 2021-01-03 DIAGNOSIS — E1142 Type 2 diabetes mellitus with diabetic polyneuropathy: Secondary | ICD-10-CM

## 2021-01-03 DIAGNOSIS — Z794 Long term (current) use of insulin: Secondary | ICD-10-CM

## 2021-01-03 DIAGNOSIS — E11649 Type 2 diabetes mellitus with hypoglycemia without coma: Secondary | ICD-10-CM

## 2021-01-03 DIAGNOSIS — E1169 Type 2 diabetes mellitus with other specified complication: Secondary | ICD-10-CM | POA: Diagnosis not present

## 2021-01-03 DIAGNOSIS — E1159 Type 2 diabetes mellitus with other circulatory complications: Secondary | ICD-10-CM | POA: Diagnosis not present

## 2021-01-03 DIAGNOSIS — E119 Type 2 diabetes mellitus without complications: Secondary | ICD-10-CM | POA: Insufficient documentation

## 2021-01-03 LAB — MICROALBUMIN / CREATININE URINE RATIO
Creatinine,U: 120.7 mg/dL
Microalb Creat Ratio: 6.9 mg/g (ref 0.0–30.0)
Microalb, Ur: 8.4 mg/dL — ABNORMAL HIGH (ref 0.0–1.9)

## 2021-01-03 LAB — BASIC METABOLIC PANEL
BUN: 18 mg/dL (ref 6–23)
CO2: 29 mEq/L (ref 19–32)
Calcium: 10.9 mg/dL — ABNORMAL HIGH (ref 8.4–10.5)
Chloride: 105 mEq/L (ref 96–112)
Creatinine, Ser: 1.27 mg/dL — ABNORMAL HIGH (ref 0.40–1.20)
GFR: 38.89 mL/min — ABNORMAL LOW (ref >60.00)
Glucose, Bld: 180 mg/dL — ABNORMAL HIGH (ref 70–99)
Potassium: 3.7 mEq/L (ref 3.5–5.1)
Sodium: 142 mEq/L (ref 135–145)

## 2021-01-03 LAB — POCT GLYCOSYLATED HEMOGLOBIN (HGB A1C): Hemoglobin A1C: 6.1 % — AB (ref 4.0–5.6)

## 2021-01-03 LAB — POCT GLUCOSE (DEVICE FOR HOME USE): POC Glucose: 187 mg/dl — AB (ref 70–99)

## 2021-01-03 MED ORDER — LANTUS SOLOSTAR 100 UNIT/ML ~~LOC~~ SOPN: 18.0000 [IU] | PEN_INJECTOR | Freq: Every day | SUBCUTANEOUS | 6 refills | Status: DC

## 2021-01-03 NOTE — Patient Instructions (Signed)
-   STOP GLipizide  - Decrease Lantus to 18 units daily     HOW TO TREAT LOW BLOOD SUGARS (Blood sugar LESS THAN 70 MG/DL)  Please follow the RULE OF 15 for the treatment of hypoglycemia treatment (when your (blood sugars are less than 70 mg/dL)    STEP 1: Take 15 grams of carbohydrates when your blood sugar is low, which includes:   3-4 GLUCOSE TABS  OR  3-4 OZ OF JUICE OR REGULAR SODA OR  ONE TUBE OF GLUCOSE GEL     STEP 2: RECHECK blood sugar in 15 MINUTES STEP 3: If your blood sugar is still low at the 15 minute recheck --> then, go back to STEP 1 and treat AGAIN with another 15 grams of carbohydrates.

## 2021-01-03 NOTE — Progress Notes (Signed)
Name: Kristen Weber  MRN/ DOB: [id number], Feb 13, 1934   Age/ Sex: 87 y.o., female    PCP: Tacey Ruiz, FNP   Reason for Endocrinology Evaluation: Type 2 Diabetes Mellitus     Date of Initial Endocrinology Visit: 01/03/2021     PATIENT IDENTIFIER: Kristen Weber is a 87 y.o. female with a past medical history of T2DM, CAD, HTN and Dyslipidemia. The patient presented for initial endocrinology clinic visit on 01/03/2021 for consultative assistance with her diabetes management.    HPI: [name] accompanied by daughter Kristen Weber    Diagnosed with DM years ago Prior Medications tried/Intolerance: Metformin - ineffective .  Currently checking blood sugars 2-3 x / day Hypoglycemia episodes : Yes      Symptoms: no               Frequency: variable mainly fasting  Hemoglobin A1c has ranged from 6.6% in 2016, peaking at 7.9% in 2015. Patient required assistance for hypoglycemia: no  Patient has required hospitalization within the last 1 year from hyper or hypoglycemia: no   In terms of diet, the patient eats 2 meals a day, snacks often. Drinks sweet teat    HOME DIABETES REGIMEN: Glipizide XL 10 mg daily  Lantus 20 - 24 units days     Statin: yes ACE-I/ARB: yes Prior Diabetic Education: Yes   METER DOWNLOAD SUMMARY: Did not bring    DIABETIC COMPLICATIONS: Microvascular complications:  Neuropathy Denies: CKD, retinopathy  Last eye exam: Completed 12/2020  Macrovascular complications:  CAD, CVD  Denies: PVD   PAST HISTORY: Past Medical History:  Past Medical History:  Diagnosis Date  . Abnormal cardiovascular stress test 09/20/2013   Possibly abnormal nuclear stress at [place]-anteroseptal abnormality interpreted as possible breast attenuation as well, motion artifact. Normal EF. No TID. Watching medically  . Diabetes (HCC) 09/20/2013  . Diabetes mellitus (HCC)    Type 2  . Essential hypertension 09/20/2013  . H/O: hysterectomy   . Hyperlipidemia 09/20/2013   . RA (rheumatoid arthritis) (HCC)   . Sleep apnea    uses cpap   Past Surgical History:  Past Surgical History:  Procedure Laterality Date  . CARDIAC CATHETERIZATION  02/12/2015  . LEFT HEART CATHETERIZATION WITH CORONARY ANGIOGRAM N/A 02/12/2015   Procedure: LEFT HEART CATHETERIZATION WITH CORONARY ANGIOGRAM;  Surgeon: Lennette Bihari, MD;  Location: [facility] CATH LAB;  Service: Cardiovascular;  Laterality: N/A;  . TOTAL ABDOMINAL HYSTERECTOMY      Social History:  reports that she has never smoked. Her smokeless tobacco use includes snuff. She reports that she does not drink alcohol and does not use drugs. Family History:  Family History  Problem Relation Age of Onset  . Diabetes Mother      HOME MEDICATIONS: Allergies as of 01/03/2021      Reactions   Lisinopril Cough   cough      Medication List       Accurate as of January 03, 2021  1:44 PM. If you have any questions, ask your nurse or doctor.        Accu-Chek Aviva Plus test strip Generic drug: glucose blood 1 each by Other route 2 (two) times daily.   aspirin EC 81 MG tablet Take 1 tablet (81 mg total) by mouth at bedtime.   atorvastatin 20 MG tablet Commonly known as: LIPITOR Take 1 tablet (20 mg total) by mouth every morning.   clopidogrel 75 MG tablet Commonly known as: PLAVIX Take 75 mg by mouth daily.  diltiazem 120 MG 24 hr capsule Commonly known as: CARDIZEM CD Take 1 capsule (120 mg total) by mouth at bedtime.   DSS 100 MG Caps Take 100 mg by mouth as needed.   glipiZIDE XL 10 MG 24 hr tablet Generic drug: glipiZIDE Take 10 mg by mouth daily.   isosorbide mononitrate 60 MG 24 hr tablet Commonly known as: IMDUR Take 1 tablet (60 mg total) by mouth every morning.   Lantus SoloStar 100 UNIT/ML Solostar Pen Generic drug: insulin glargine Inject 28 Units into the skin daily.   loratadine 10 MG tablet Commonly known as: CLARITIN Take 10 mg by mouth daily.   losartan 25 MG tablet Commonly  known as: COZAAR TAKE ONE TABLET AT BEDTIME   nitroGLYCERIN 0.4 MG SL tablet Commonly known as: Nitrostat Place 1 tablet (0.4 mg total) under the tongue every 5 (five) minutes as needed.   oxyCODONE-acetaminophen 5-325 MG tablet Commonly known as: PERCOCET/ROXICET Take 1 tablet by mouth 2 (two) times daily as needed.   topiramate 25 MG tablet Commonly known as: TOPAMAX TAKE ONE TABLET BY MOUTH EVERY MORNING and TAKE ONE TABLET AT BEDTIME   traMADol 100 MG 24 hr tablet Commonly known as: ULTRAM-ER Take 100 mg by mouth at bedtime. 50 mg during the day   traMADol 50 MG tablet Commonly known as: ULTRAM Take 100 mg by mouth daily as needed.   UNABLE TO FIND Take 1 tablet by mouth daily. Med Name: Gas Relief OTC   VITAMIN D3 PO Take 850 mg by mouth 2 (two) times daily.        ALLERGIES: Allergies  Allergen Reactions  . Lisinopril Cough    cough     REVIEW OF SYSTEMS: A comprehensive ROS was conducted with the patient and is negative except as per HPI and below:  Review of Systems  Gastrointestinal: Negative for diarrhea and nausea.  Endo/Heme/Allergies: Positive for polydipsia.      OBJECTIVE:   VITAL SIGNS: BP 118/82   Pulse 68   Ht 5\' 3"  (1.6 m)   Wt 192 lb 4 oz (87.2 kg)   SpO2 95%   BMI 34.06 kg/m    PHYSICAL EXAM:  General: Pt appears well and is in NAD  Neck: General: Supple without adenopathy or carotid bruits. Thyroid: Thyroid size normal.  No goiter or nodules appreciated. No  Lungs: Clear with good BS bilat with no rales, rhonchi, or wheezes  Heart: RRR    Extremities:  Lower extremities - No pretibial edema.  Neuro: MS is good with appropriate affect, pt is alert and Ox3     DATA REVIEWED:  Lab Results  Component Value Date   HGBA1C 6.1 (A) 01/03/2021   HGBA1C 6.6 (H) 02/11/2015   HGBA1C 7.9 (H) 10/17/2014   Lab Results  Component Value Date   CREATININE 1.06 (H) 09/27/2015     Lab Results  Component Value Date   CHOL 184  10/17/2014   HDL 44.80 10/17/2014   LDLDIRECT 64.8 10/17/2014   TRIG 311.0 (H) 10/17/2014   CHOLHDL 4 10/17/2014      Results for Kristen Weber (MRN [id number]) as of 01/06/2021 14:02  Ref. Range 01/03/2021 14:28  Sodium Latest Ref Range: 135 - 145 mEq/L 142  Potassium Latest Ref Range: 3.5 - 5.1 mEq/L 3.7  Chloride Latest Ref Range: 96 - 112 mEq/L 105  CO2 Latest Ref Range: 19 - 32 mEq/L 29  Glucose Latest Ref Range: 70 - 99 mg/dL 263 (H)  BUN Latest Ref  Range: 6 - 23 mg/dL 18  Creatinine Latest Ref Range: 0.40 - 1.20 mg/dL 1.61 (H)  Calcium Latest Ref Range: 8.4 - 10.5 mg/dL 09.6 (H)  GFR Latest Ref Range: >60.00 mL/min 38.89 (L)  MICROALB/CREAT RATIO Latest Ref Range: 0.0 - 30.0 mg/g 6.9    ASSESSMENT / PLAN / RECOMMENDATIONS:   1) Type 2 Diabetes Mellitus, with recurrent hypoglycemia, With neuropathic, CKD III and macrovascular complications as well as hypoglycemia unawareness - Most recent A1c of 6.1 %. Goal A1c < 7.0 %.     GENERAL: I have discussed with the patient the pathophysiology of diabetes. We went over the natural progression of the disease. We talked about both insulin resistance and insulin deficiency. We stressed the importance of lifestyle changes including diet and exercise. I explained the complications associated with diabetes including retinopathy, nephropathy, neuropathy as well as increased risk of cardiovascular disease. We went over the benefit seen with glycemic control.   I explained to the patient that diabetic patients are at higher than normal risk for amputations.  Her seemingly acceptable A1c is due to recurrent hypoglycemia. We discussed fatal consequences of hypoglycemia, what's concerning is that she has hypoglycemia unawareness which makes me suspect she  Has them frequently.  Will stop the Glipizide, will reduce Lantus as below  We discussed add-on therapy, I have recommended SGLT-2 inhibitors if her renal function is acceptable . We discussed glycose  and cardiovascular benefits, we also discussed risk of genital infections, per daughter she has seen then on TV with concerning side effects, we discussed risks vs benefits. We also discussed Metformin ( she used to be on it , but unclear why not any more) We also discussed GLP-1 agonists  MA/Cr ratio is normal  BMP shows low FDR at 38 , spoke to daughter ( per pt request) on 01/06/2021 and discussed limitations with low GFR, I have offered GLP-1 agonists ( cautioned against GI side effects ) vs prandial insulin . She will discuss with mother and get back with me.    MEDICATIONS:  - STOP GLipizide  - Decrease Lantus to 18 units daily   EDUCATION / INSTRUCTIONS: BG monitoring instructions: Patient is instructed to check her blood sugars 1 times a day, fasting. Call [facility] if: BG persistently < 70  I reviewed the Rule of 15 for the treatment of hypoglycemia in detail with the patient. Literature supplied.   2) Diabetic complications:  Eye: Does not have known diabetic retinopathy.  Neuro/ Feet: Does have known diabetic peripheral neuropathy. Renal: Patient does  have known baseline CKD. She is  on an ACEI/ARB at present.   3) Hypercalcemia:   - Primary vs tertiary - I have advised then to stop ALL OTC calcium products ( MVI, tums etc) , encouraged  hydration.  - Will proceed with PTH and possibly 24-hr urine collection on next visit    I spent 45 minutes preparing to see the patient by review of recent labs, imaging and procedures, obtaining and reviewing separately obtained history, communicating with the patient/family or caregiver, ordering medications, tests or procedures, and documenting clinical information in the EHR including the differential Dx, treatment, and any further evaluation and other management       Signed electronically by: Lyndle Herrlich, MD  [facility]  [facility] [address] [place] [place], [place] [address] Phone: [phone] FAX: [phone]   CC: Tacey Ruiz, FNP [address] [place]  [address] Phone: [phone]  Fax: [phone]    Return to Endocrinology clinic as below: Future Appointments  Date Time Provider Department Center  07/17/2021  8:00 AM Butch Penny, NP [facility] None

## 2021-01-05 ENCOUNTER — Other Ambulatory Visit: Payer: Self-pay | Admitting: Cardiology

## 2021-01-07 ENCOUNTER — Telehealth: Payer: Self-pay | Admitting: Internal Medicine

## 2021-01-07 DIAGNOSIS — E1142 Type 2 diabetes mellitus with diabetic polyneuropathy: Secondary | ICD-10-CM

## 2021-01-07 DIAGNOSIS — E1169 Type 2 diabetes mellitus with other specified complication: Secondary | ICD-10-CM

## 2021-01-07 DIAGNOSIS — Z794 Long term (current) use of insulin: Secondary | ICD-10-CM

## 2021-01-07 MED ORDER — TRULICITY 0.75 MG/0.5ML ~~LOC~~ SOAJ: 0.7500 mg | SUBCUTANEOUS | 6 refills | Status: DC

## 2021-01-07 NOTE — Telephone Encounter (Signed)
I already spoke to her yesterday for probably 12 minutes , and gave her the option of either  trying trulicity  once a week or taking short acting insulin with each meal. She is supposed to give the answer of which one.

## 2021-01-07 NOTE — Telephone Encounter (Signed)
Patients daughter Steward Drone called, stated she was returning a call she received from this office.  Call back number for Steward Drone is [phone]

## 2021-01-07 NOTE — Telephone Encounter (Signed)
Did you call patient's daughter?

## 2021-01-07 NOTE — Telephone Encounter (Signed)
Spoken to patient's daughter and they would like to try Trulicity. Patient's daughter also asked if possible for Dr Lonzo Cloud to send a referral to a kidney doctor?

## 2021-01-07 NOTE — Telephone Encounter (Signed)
Spoken to patient's daughter Steward Drone) and notified Dr Harvel Ricks comments. Verbalized understanding.

## 2021-01-07 NOTE — Addendum Note (Signed)
Addended by: Scarlette Shorts on: 01/07/2021 11:29 AM   Modules accepted: Orders

## 2021-01-08 ENCOUNTER — Telehealth: Payer: Self-pay | Admitting: Internal Medicine

## 2021-01-08 MED ORDER — INSULIN PEN NEEDLE 32G X 4 MM MISC: 1.0000 | Freq: Four times a day (QID) | 6 refills | Status: DC

## 2021-01-08 MED ORDER — NOVOLOG FLEXPEN 100 UNIT/ML ~~LOC~~ SOPN: 5.0000 [IU] | PEN_INJECTOR | Freq: Three times a day (TID) | SUBCUTANEOUS | 11 refills | Status: DC

## 2021-01-08 NOTE — Telephone Encounter (Signed)
Pt's pharmacy called saying her insurance rejected the Novolog and would need a prescription sent in for Humalog instead.  [facility] - [place], [place] - [place], [place] - [address] Phone:  [phone]  Fax:  [phone]

## 2021-01-08 NOTE — Telephone Encounter (Signed)
Spoken to patient's daughter and notified Dr Shamleffer's comments. Verbalized understanding.   

## 2021-01-08 NOTE — Telephone Encounter (Signed)
Patient's daughter Steward Drone) called stating patient changed her mind and does not wish to take Trulicity, she would rather do the insulin that she injects 3x daily - cannot remember the name but she thinks Dr Lonzo Cloud said it was Novolog.  Please call Steward Drone back at [phone] if any questions.  PHARMACY  [facility] - [place], [place] - [place], [place] - [address]  [address] [place] [address]  Phone:  [phone] Fax:  [phone]

## 2021-01-08 NOTE — Telephone Encounter (Signed)
Novolog 5 units with each sent

## 2021-01-08 NOTE — Telephone Encounter (Signed)
Please advise 

## 2021-01-09 MED ORDER — INSULIN LISPRO (1 UNIT DIAL) 100 UNIT/ML (KWIKPEN): 5.0000 [IU] | PEN_INJECTOR | Freq: Three times a day (TID) | SUBCUTANEOUS | 11 refills | Status: DC

## 2021-02-10 ENCOUNTER — Encounter: Payer: Self-pay | Admitting: Gastroenterology

## 2021-02-25 ENCOUNTER — Telehealth: Payer: Self-pay | Admitting: Internal Medicine

## 2021-02-25 ENCOUNTER — Other Ambulatory Visit: Payer: Self-pay | Admitting: Internal Medicine

## 2021-02-25 MED ORDER — FREESTYLE LIBRE 2 SENSOR MISC: 1.0000 | 11 refills | Status: DC

## 2021-02-25 MED ORDER — FREESTYLE LIBRE 2 READER DEVI: 1.0000 | 0 refills | Status: DC

## 2021-02-25 NOTE — Telephone Encounter (Addendum)
Ok to sent? Patient have an appointment next Monday 03/03/2021

## 2021-02-25 NOTE — Telephone Encounter (Addendum)
Spoken to patient's daughter and notified Dr Shamleffer's comments. Verbalized understanding.   

## 2021-02-25 NOTE — Telephone Encounter (Signed)
Done.  Let her know if its declined, to check directly with the insurance and see what they cover or why it was declined

## 2021-02-25 NOTE — Telephone Encounter (Signed)
Patients daughter called and requested a Free [employer] for patient so that she does not have to stick herself so much.  Call back # [phone]

## 2021-02-26 ENCOUNTER — Telehealth: Payer: Self-pay | Admitting: Internal Medicine

## 2021-02-26 NOTE — Telephone Encounter (Signed)
Noted. Prior Auth have been submitted to patient's insurance.  

## 2021-02-26 NOTE — Telephone Encounter (Signed)
Pt's daughter called regarding the prescription that was sent in for [place], insurance doesn't cover it and says the pharmacy sent paperwork over to [place] to be filled out in order for it to be covered.   Any questions call [phone] Steward Drone, pt's daughter

## 2021-03-03 ENCOUNTER — Ambulatory Visit: Payer: Medicare Other | Admitting: Internal Medicine

## 2021-03-04 ENCOUNTER — Telehealth: Payer: Self-pay | Admitting: Internal Medicine

## 2021-03-04 NOTE — Telephone Encounter (Signed)
Pt's daughter brenda calling regarding for the meter on arm. Wanting to talk with [facility] or nurse regarding on how to receive one. Pt's daughter would like a call back.

## 2021-03-05 NOTE — Telephone Encounter (Signed)
Patient's daughter called to advise that Free Style was declined- I asked if she contacted insurance directly to see what they cover or why it was declined. She said and said that she wanted to check with office before moving on

## 2021-03-05 NOTE — Telephone Encounter (Signed)
Spoken to patient's daughter Steward Drone) and inform her that I have submitted PA for [facility] last week.  Received faxed back that this requested device is not cover under Medicare Part D.  I have asked patient's daughter to contact the insurance to check how they may be able to get one.

## 2021-03-12 ENCOUNTER — Encounter: Payer: Self-pay | Admitting: Gastroenterology

## 2021-03-12 ENCOUNTER — Other Ambulatory Visit: Payer: Self-pay

## 2021-03-12 ENCOUNTER — Ambulatory Visit (INDEPENDENT_AMBULATORY_CARE_PROVIDER_SITE_OTHER): Payer: Medicare Other | Admitting: Gastroenterology

## 2021-03-12 VITALS — BP 128/88 | HR 82 | Ht 63.0 in | Wt 192.4 lb

## 2021-03-12 DIAGNOSIS — R1319 Other dysphagia: Secondary | ICD-10-CM

## 2021-03-12 DIAGNOSIS — K219 Gastro-esophageal reflux disease without esophagitis: Secondary | ICD-10-CM | POA: Diagnosis not present

## 2021-03-12 MED ORDER — PANTOPRAZOLE SODIUM 20 MG PO TBEC: 20.0000 mg | DELAYED_RELEASE_TABLET | Freq: Every day | ORAL | 2 refills | Status: DC

## 2021-03-12 NOTE — Progress Notes (Signed)
Chief Complaint: Dysphagia  Referring Provider:  Tacey Ruiz, FNP      ASSESSMENT AND PLAN;   #1. GERD with eso dysphagia. D/d includes eso stricture, Schatzki's ring, motility disorder, EoE, pill induced esophagitis, r/o esophageal carcinoma/extrinsic lesions or Achalasia.  Plan: -Protonix 20 mg p.o. QD, 1/2 hr before breakfast. #30.  2 refills. -Ba swallow with Ba tablet as well.  Special attention to neck, r/o Zenker's diverticulum. -EGD with eso bx and possibly dil.  Discussed risks and benefits including small but definite risks of eso perforation, bleeding, risks of anesthesia.  The benefits were also discussed. Consent forms given.      HPI:    Kristen Weber is a 87 y.o. female  CAD (Nl EF), DM2, CKD3, OSA on CPAP, TIA, hypothyroidism, thyroid nodules Accompanied by her daughter who is a patient of ours  With H/O Dysphagia to pills x 3 months, getting worse.  Describes these getting hung up in the neck area which she has to wash with liquids.  Not having any problems with food.  No shortness of breath.  No regurgitation.  No melena or hematemesis.  Remote EGD by Dr. Glori Bickers at [place] over 15 to 20 years ago.  Occasional but not very significant heartburn.  No odynophagia.  No weight loss.  Longstanding H/O constipation with abdominal bloating.  Better with senna 3/QOD.   Never had colonoscopy and "doesn't want one".  From Cardiology notes: cardiac catheterization 2016-CAD not amenable to PCI. -Moderate diffuse disease with mild proximal calcification of LAD -80% stenosis a very small ramus branch not amenable to PCI -40% circumflex -50% mid PDA stenosis Aggressive medical management was recommended at that time in 2016.  Repeat cardiac catheterization was discussed at 1 point patient declined.  Past Medical History:  Diagnosis Date  . Abnormal cardiovascular stress test 09/20/2013   Possibly abnormal nuclear stress at [place]-anteroseptal abnormality  interpreted as possible breast attenuation as well, motion artifact. Normal EF. No TID. Watching medically  . Diabetes (HCC) 09/20/2013  . Diabetes mellitus (HCC)    Type 2  . Essential hypertension 09/20/2013  . H/O: hysterectomy   . Hyperlipidemia 09/20/2013  . RA (rheumatoid arthritis) (HCC)   . Sleep apnea    uses cpap    Past Surgical History:  Procedure Laterality Date  . CARDIAC CATHETERIZATION  02/12/2015  . LEFT HEART CATHETERIZATION WITH CORONARY ANGIOGRAM N/A 02/12/2015   Procedure: LEFT HEART CATHETERIZATION WITH CORONARY ANGIOGRAM;  Surgeon: Lennette Bihari, MD;  Location: [facility] CATH LAB;  Service: Cardiovascular;  Laterality: N/A;  . TOTAL ABDOMINAL HYSTERECTOMY      Family History  Problem Relation Age of Onset  . Diabetes Mother     Social History   Tobacco Use  . Smoking status: Never Smoker  . Smokeless tobacco: Current User    Types: Snuff  Vaping Use  . Vaping Use: Never used  Substance Use Topics  . Alcohol use: No    Alcohol/week: 0.0 standard drinks  . Drug use: No    Current Outpatient Medications  Medication Sig Dispense Refill  . ACCU-CHEK AVIVA PLUS test strip 1 each by Other route 2 (two) times daily.     Marland Kitchen aspirin EC 81 MG tablet Take 1 tablet (81 mg total) by mouth at bedtime. 90 tablet 3  . atorvastatin (LIPITOR) 20 MG tablet Take 1 tablet (20 mg total) by mouth every morning. 90 tablet 2  . Cholecalciferol (VITAMIN D3 PO) Take 850 mg by mouth 2 (  two) times daily.    . Continuous Blood Gluc Receiver (FREESTYLE LIBRE 2 READER) DEVI 1 Device by Does not apply route as directed. 1 each 0  . Continuous Blood Gluc Sensor (FREESTYLE LIBRE 2 SENSOR) MISC 1 Device by Does not apply route as directed. 2 each 11  . diltiazem (CARDIZEM CD) 120 MG 24 hr capsule Take 1 capsule (120 mg total) by mouth at bedtime. 90 capsule 2  . insulin glargine (LANTUS SOLOSTAR) 100 UNIT/ML Solostar Pen Inject 18 Units into the skin daily. 15 mL 6  . insulin lispro  (HUMALOG KWIKPEN) 100 UNIT/ML KwikPen Inject 5 Units into the skin 3 (three) times daily. 15 mL 11  . Insulin Pen Needle 32G X 4 MM MISC 1 Device by Does not apply route in the morning, at noon, in the evening, and at bedtime. 150 each 6  . isosorbide mononitrate (IMDUR) 60 MG 24 hr tablet Take 1 tablet (60 mg total) by mouth every morning. 90 tablet 2  . loratadine (CLARITIN) 10 MG tablet Take 10 mg by mouth daily.    Marland Kitchen losartan (COZAAR) 25 MG tablet TAKE ONE TABLET AT BEDTIME 90 tablet 3  . nitroGLYCERIN (NITROSTAT) 0.4 MG SL tablet Place 1 tablet (0.4 mg total) under the tongue every 5 (five) minutes as needed. 25 tablet 6  . topiramate (TOPAMAX) 25 MG tablet TAKE ONE TABLET BY MOUTH EVERY MORNING and TAKE ONE TABLET AT BEDTIME 180 tablet 3  . traMADol (ULTRAM) 50 MG tablet Take 100 mg by mouth daily as needed.     No current facility-administered medications for this visit.    Allergies  Allergen Reactions  . Lisinopril Cough    cough    Review of Systems:  Constitutional: Denies fever, chills, diaphoresis, appetite change and fatigue.  HEENT: Denies photophobia, eye pain, redness, hearing loss, ear pain, congestion, sore throat, rhinorrhea, sneezing, mouth sores, neck pain, neck stiffness and tinnitus.   Respiratory: Denies SOB, DOE, cough, chest tightness,  and wheezing.   Cardiovascular: Denies chest pain, palpitations and leg swelling.  Genitourinary: Denies dysuria, urgency, frequency, hematuria, flank pain and difficulty urinating.  Musculoskeletal: Denies myalgias, back pain, joint swelling, arthralgias and has gait problem.  Skin: No rash.  Neurological: Denies dizziness, seizures, syncope, weakness, light-headedness, numbness and headaches.  Hematological: Denies adenopathy. Easy bruising, personal or family bleeding history  Psychiatric/Behavioral: No anxiety or depression     Physical Exam:    BP 128/88 (BP Location: Left Arm, Patient Position: Sitting, Cuff Size:  Normal)   Pulse 82   Ht 5\' 3"  (1.6 m)   Wt 192 lb 6 oz (87.3 kg)   BMI 34.08 kg/m  Wt Readings from Last 3 Encounters:  03/12/21 192 lb 6 oz (87.3 kg)  01/03/21 192 lb 4 oz (87.2 kg)  10/11/20 187 lb (84.8 kg)   Constitutional:  Well-developed, in no acute distress. Psychiatric: Normal mood and affect. Behavior is normal. HEENT: Pupils normal.  Conjunctivae are normal. No scleral icterus. Neck supple.  Cardiovascular: Normal rate, regular rhythm. No edema Pulmonary/chest: Effort normal and breath sounds normal. No wheezing, rales or rhonchi. Abdominal: Soft, nondistended. Nontender. Bowel sounds active throughout. There are no masses palpable. No hepatomegaly. Rectal: Deferred Neurological: Alert and oriented to person place and time. Skin: Skin is warm and dry. No rashes noted.  Data Reviewed: I have personally reviewed following labs and imaging studies  CBC: CBC Latest Ref Rng & Units 09/27/2015 02/12/2015 02/11/2015  WBC 4.0 - 10.5 K/uL 3.0(L)  3.2(L) 3.5(L)  Hemoglobin 12.0 - 15.0 g/dL 50.2 77.4 12.8  Hematocrit 36.0 - 46.0 % 40.4 38.6 41.1  Platelets 150 - 400 K/uL 156 157 180    CMP: CMP Latest Ref Rng & Units 01/03/2021 09/27/2015 02/11/2015  Glucose 70 - 99 mg/dL 786(V) 672(C) 947(S)  BUN 6 - 23 mg/dL 18 15 18   Creatinine 0.40 - 1.20 mg/dL ) 9.62(E) 3.66(Q  Sodium 135 - 145 mEq/L 142 138 137  Potassium 3.5 - 5.1 mEq/L 3.7 3.7 3.9  Chloride 96 - 112 mEq/L 105 105 100  CO2 19 - 32 mEq/L 29 27 31   Calcium 8.4 - 10.5 mg/dL 10.9(H) 10.5(H) 10.3  Total Protein 6.0 - 8.3 g/dL - - 6.2  Total Bilirubin 0.3 - 1.2 mg/dL - - 9.47)  Alkaline Phos 39 - 117 U/L - - 68  AST 0 - 37 U/L - - 23  ALT 0 - 35 U/L - - 18     6.5(Y, MD 03/12/2021, 9:20 AM  Cc: Edman Circle, FNP

## 2021-03-12 NOTE — Patient Instructions (Addendum)
If you are age 85 or older, your body mass index should be between 23-30. Your Body mass index is 34.08 kg/m. If this is out of the aforementioned range listed, please consider follow up with your Primary Care Provider.  If you are age 20 or younger, your body mass index should be between 19-25. Your Body mass index is 34.08 kg/m. If this is out of the aformentioned range listed, please consider follow up with your Primary Care Provider.   You have been scheduled for a Barium Esophogram at [facility]iology (1st floor of the hospital) on Mar 24, 2021 at 1030am . Please arrive 15 minutes prior to your appointment for registration. Make certain not to have anything to eat or drink 3 hours prior to your test. If you need to reschedule for any reason, please contact radiology at [phone] to do so. __________________________________________________________________ A barium swallow is an examination that concentrates on views of the esophagus. This tends to be a double contrast exam (barium and two liquids which, when combined, create a gas to distend the wall of the oesophagus) or single contrast (non-ionic iodine based). The study is usually tailored to your symptoms so a good history is essential. Attention is paid during the study to the form, structure and configuration of the esophagus, looking for functional disorders (such as aspiration, dysphagia, achalasia, motility and reflux) EXAMINATION You may be asked to change into a gown, depending on the type of swallow being performed. A radiologist and radiographer will perform the procedure. The radiologist will advise you of the type of contrast selected for your procedure and direct you during the exam. You will be asked to stand, sit or lie in several different positions and to hold a small amount of fluid in your mouth before being asked to swallow while the imaging is performed .In some instances you may be asked to swallow barium coated  marshmallows to assess the motility of a solid food bolus. The exam can be recorded as a digital or video fluoroscopy procedure. POST PROCEDURE It will take 1-2 days for the barium to pass through your system. To facilitate this, it is important, unless otherwise directed, to increase your fluids for the next 24-48hrs and to resume your normal diet.  This test typically takes about 30 minutes to perform. __________________________________________________________________________________  We have sent the following medications to your pharmacy for you to pick up at your convenience: Protonix 20mg  daily   You have been scheduled for an endoscopy. Please follow written instructions given to you at your visit today. If you use inhalers (even only as needed), please bring them with you on the day of your procedure.  Thank you,  Dr. 

## 2021-03-24 ENCOUNTER — Other Ambulatory Visit: Payer: Self-pay

## 2021-03-24 ENCOUNTER — Ambulatory Visit (HOSPITAL_COMMUNITY)
Admission: RE | Admit: 2021-03-24 | Discharge: 2021-03-24 | Disposition: A | Discharge status: Home / Self Care | Payer: Medicare Other | Source: Ambulatory Visit | Attending: Gastroenterology | Admitting: Gastroenterology

## 2021-03-24 DIAGNOSIS — K219 Gastro-esophageal reflux disease without esophagitis: Secondary | ICD-10-CM | POA: Insufficient documentation

## 2021-03-24 DIAGNOSIS — R1319 Other dysphagia: Secondary | ICD-10-CM | POA: Diagnosis present

## 2021-03-26 ENCOUNTER — Ambulatory Visit: Payer: Medicare Other | Admitting: Internal Medicine

## 2021-04-09 ENCOUNTER — Telehealth: Payer: Self-pay | Admitting: Gastroenterology

## 2021-04-09 NOTE — Telephone Encounter (Signed)
covid test is negative per daughter.  Ok to proceed per Dr. Chales Abrahams

## 2021-04-09 NOTE — Telephone Encounter (Signed)
Hi Kristen, If home test is neg, okay to proceed RG

## 2021-04-09 NOTE — Telephone Encounter (Signed)
Spoke with pt's daughter- she states pt is having a cough with "some drainage."  No fever, no other issues of any kind.  She does not believe her mother has been around any one covid positive.  Pt does have an at home covid test- I asked her to have pt take this test.  I will call back in about 30 minutes to get the results.  I will forward this to Dr. Chales Abrahams so he is aware.. Pt has received Covid vaccine.  Dr. Chales Abrahams,  If pt's home covid test is negative, is she ok to proceed tomorrow?  Thanks, [employer]

## 2021-04-09 NOTE — Telephone Encounter (Signed)
Patients daughter called requested to speak with a nurse regarding a cough that the patient currently has.

## 2021-04-10 ENCOUNTER — Other Ambulatory Visit: Payer: Self-pay

## 2021-04-10 ENCOUNTER — Ambulatory Visit (AMBULATORY_SURGERY_CENTER): Payer: Medicare Other | Admitting: Gastroenterology

## 2021-04-10 ENCOUNTER — Encounter: Payer: Self-pay | Admitting: Gastroenterology

## 2021-04-10 VITALS — BP 158/98 | HR 84 | Temp 96.9°F | Resp 23 | Ht 63.0 in | Wt 192.0 lb

## 2021-04-10 DIAGNOSIS — K319 Disease of stomach and duodenum, unspecified: Secondary | ICD-10-CM

## 2021-04-10 DIAGNOSIS — K222 Esophageal obstruction: Secondary | ICD-10-CM

## 2021-04-10 DIAGNOSIS — K31A15 Gastric intestinal metaplasia without dysplasia, involving multiple sites: Secondary | ICD-10-CM

## 2021-04-10 DIAGNOSIS — R1319 Other dysphagia: Secondary | ICD-10-CM

## 2021-04-10 DIAGNOSIS — K31A0 Gastric intestinal metaplasia, unspecified: Secondary | ICD-10-CM

## 2021-04-10 DIAGNOSIS — K2970 Gastritis, unspecified, without bleeding: Secondary | ICD-10-CM | POA: Diagnosis not present

## 2021-04-10 DIAGNOSIS — K449 Diaphragmatic hernia without obstruction or gangrene: Secondary | ICD-10-CM

## 2021-04-10 DIAGNOSIS — K219 Gastro-esophageal reflux disease without esophagitis: Secondary | ICD-10-CM

## 2021-04-10 MED ORDER — SODIUM CHLORIDE 0.9 % IV SOLN: 500.0000 mL | Freq: Once | INTRAVENOUS | Status: DC

## 2021-04-10 NOTE — Progress Notes (Signed)
Medical history reviewed. VS assessed by J.D Daughter present at the bedside.

## 2021-04-10 NOTE — Progress Notes (Signed)
Pt came in with chronic cough.  Pt continued to cough the entire procedure.  Tried suctioning and repositioning.  Pt sats had to be supported with jaw thrust most all procedure.  Sats as recorded.  Stayed in procedure room until pt awake alert and oriented an no complications reported

## 2021-04-10 NOTE — Patient Instructions (Signed)
Post esophageal diet today- see handout!!!  Await pathology  TAKE PILLS IN STANDING POSITION WITH AT LEAST 6 OUNCES OF WATER!! CHEW FOOD, ESPECIALLY  MEATS AND BREADS WELL.  EAT SLOWLY  Repeat upper endoscopy as needed  YOU HAD AN ENDOSCOPIC PROCEDURE TODAY AT THE [facility]:   Refer to the procedure report that was given to you for any specific questions about what was found during the examination.  If the procedure report does not answer your questions, please call your gastroenterologist to clarify.  If you requested that your care partner not be given the details of your procedure findings, then the procedure report has been included in a sealed envelope for you to review at your convenience later.  YOU SHOULD EXPECT: Some feelings of bloating in the abdomen. Passage of more gas than usual.  Walking can help get rid of the air that was put into your GI tract during the procedure and reduce the bloating.   Please Note:  You might notice some irritation and congestion in your nose or some drainage.  This is from the oxygen used during your procedure.  There is no need for concern and it should clear up in a day or so.  SYMPTOMS TO REPORT IMMEDIATELY:   Following upper endoscopy (EGD)  Vomiting of blood or coffee ground material  New chest pain or pain under the shoulder blades  Painful or persistently difficult swallowing  New shortness of breath  Fever of 100F or higher  Black, tarry-looking stools  For urgent or emergent issues, a gastroenterologist can be reached at any hour by calling [phone]. Do not use MyChart messaging for urgent concerns.    DIET:  We do recommend a small meal at first, but then you may proceed to your regular diet.  Drink plenty of fluids but you should avoid alcoholic beverages for 24 hours.  ACTIVITY:  You should plan to take it easy for the rest of today and you should NOT DRIVE or use heavy machinery until tomorrow (because of the  sedation medicines used during the test).    FOLLOW UP: Our staff will call the number listed on your records 48-72 hours following your procedure to check on you and address any questions or concerns that you may have regarding the information given to you following your procedure. If we do not reach you, we will leave a message.  We will attempt to reach you two times.  During this call, we will ask if you have developed any symptoms of COVID 19. If you develop any symptoms (ie: fever, flu-like symptoms, shortness of breath, cough etc.) before then, please call [phone].  If you test positive for Covid 19 in the 2 weeks post procedure, please call and report this information to [place].    If any biopsies were taken you will be contacted by phone or by letter within the next 1-3 weeks.  Please call us at [phone] if you have not heard about the biopsies in 3 weeks.    SIGNATURES/CONFIDENTIALITY: You and/or your care partner have signed paperwork which will be entered into your electronic medical record.  These signatures attest to the fact that that the information above on your After Visit Summary has been reviewed and is understood.  Full responsibility of the confidentiality of this discharge information lies with you and/or your care-partner.

## 2021-04-10 NOTE — Progress Notes (Signed)
Called to room to assist during endoscopic procedure.  Patient ID and intended procedure confirmed with present staff. Received instructions for my participation in the procedure from the performing physician.  

## 2021-04-10 NOTE — Op Note (Signed)
[facility] Patient Name: Kristen Weber Procedure Date: 04/10/2021 9:44 AM MRN: [id number] Endoscopist: Lynann Bologna , MD Age: 87 Referring MD:  Date of Birth: 1934-01-05 Gender: Female Account #: [id number] Procedure:                Upper GI endoscopy Indications:              Dysphagia with abnormal barium swallow Medicines:                Monitored Anesthesia Care Procedure:                Pre-Anesthesia Assessment:                           - Prior to the procedure, a History and Physical                            was performed, and patient medications and                            allergies were reviewed. The patient's tolerance of                            previous anesthesia was also reviewed. The risks                            and benefits of the procedure and the sedation                            options and risks were discussed with the patient.                            All questions were answered, and informed consent                            was obtained. Prior Anticoagulants: The patient has                            taken no previous anticoagulant or antiplatelet                            agents. ASA Grade Assessment: III - A patient with                            severe systemic disease. After reviewing the risks                            and benefits, the patient was deemed in                            satisfactory condition to undergo the procedure.                           After obtaining informed consent, the endoscope was  passed under direct vision. Throughout the                            procedure, the patient's blood pressure, pulse, and                            oxygen saturations were monitored continuously. The                            Endoscope was introduced through the mouth, and                            advanced to the second part of duodenum. The upper                            GI endoscopy was  accomplished without difficulty.                            The patient tolerated the procedure well. Scope In: Scope Out: Findings:                 The esophagus was moderately tortuous especially in                            the distal half of the esophagus. A non-obstructing                            and moderate ring was found, 1 cm above the                            gastroesophageal junction with luminal diameter of                            10 mm. A wide open Schatzki's ring was noted at GE                            junction 36 cm from the incisors. A TTS dilator was                            passed through the scope. Dilation with a                            13.5-14.5-15.5 mm balloon dilator was performed to                            15.5 mm. The scope was withdrawn. Dilation was                            performed with a Maloney dilator with mild                            resistance at 48 Fr.  A 2 cm hiatal hernia was present.                           Localized mild inflammation characterized by                            erythema was found in the gastric antrum. Biopsies                            were taken with a cold forceps for histology. The                            retroflexed examination of the cardia revealed GE                            junction flap to be Hill's grade IV.                           The examined duodenum was normal. Complications:            No immediate complications. However, patient                            continued to cough throughout the procedure. This                            to some extent did limit the examination especially                            of the duodenum. Estimated Blood Loss:     Estimated blood loss: none. Impression:               - Non-obstructing and moderate distal esophageal                            ring. Dilated.                           - 2 cm hiatal hernia.                            - Gastritis. Biopsied. Recommendation:           - Patient has a contact number available for                            emergencies. The signs and symptoms of potential                            delayed complications were discussed with the                            patient. Return to normal activities tomorrow.                            Written discharge instructions were provided to the  patient.                           - Post dilatation diet.                           - Continue present medications.                           - Await pathology results.                           - Take all pills in standing position with at least                            6 ounces of water. Chew food especially meats and                            breads well and eat slowly.                           - Repeat upper endoscopy PRN.                           - The findings and recommendations were discussed                            with the patient's family. Lynann Bologna, MD 04/10/2021 10:09:23 AM This report has been signed electronically.

## 2021-04-10 NOTE — Progress Notes (Signed)
Pt has productive cough in the RR- she and daughter both state this isn't new for her; she has had a cough for a couple of days

## 2021-04-14 ENCOUNTER — Telehealth: Payer: Self-pay

## 2021-04-14 NOTE — Telephone Encounter (Signed)
LVM

## 2021-04-16 ENCOUNTER — Other Ambulatory Visit: Payer: Self-pay

## 2021-04-16 ENCOUNTER — Ambulatory Visit (INDEPENDENT_AMBULATORY_CARE_PROVIDER_SITE_OTHER): Payer: Medicare Other | Admitting: Internal Medicine

## 2021-04-16 ENCOUNTER — Encounter: Payer: Self-pay | Admitting: Gastroenterology

## 2021-04-16 ENCOUNTER — Encounter: Payer: Self-pay | Admitting: Internal Medicine

## 2021-04-16 VITALS — BP 124/84 | HR 88 | Ht 63.0 in | Wt 190.1 lb

## 2021-04-16 DIAGNOSIS — E1159 Type 2 diabetes mellitus with other circulatory complications: Secondary | ICD-10-CM | POA: Diagnosis not present

## 2021-04-16 DIAGNOSIS — E1142 Type 2 diabetes mellitus with diabetic polyneuropathy: Secondary | ICD-10-CM | POA: Diagnosis not present

## 2021-04-16 DIAGNOSIS — E1169 Type 2 diabetes mellitus with other specified complication: Secondary | ICD-10-CM | POA: Diagnosis not present

## 2021-04-16 DIAGNOSIS — Z794 Long term (current) use of insulin: Secondary | ICD-10-CM

## 2021-04-16 LAB — POCT GLYCOSYLATED HEMOGLOBIN (HGB A1C): Hemoglobin A1C: 7.5 % — AB (ref 4.0–5.6)

## 2021-04-16 LAB — POCT GLUCOSE (DEVICE FOR HOME USE): POC Glucose: 179 mg/dl — AB (ref 70–99)

## 2021-04-16 MED ORDER — FREESTYLE LIBRE 2 SENSOR MISC: 1.0000 | 11 refills | Status: DC

## 2021-04-16 MED ORDER — LANTUS SOLOSTAR 100 UNIT/ML ~~LOC~~ SOPN: 20.0000 [IU] | PEN_INJECTOR | Freq: Every day | SUBCUTANEOUS | 6 refills | Status: DC

## 2021-04-16 MED ORDER — FREESTYLE LIBRE 2 READER DEVI: 1.0000 | 0 refills | Status: DC

## 2021-04-16 NOTE — Progress Notes (Signed)
Name: Kristen Weber  Age/ Sex: 87 y.o., female   MRN/ DOB: [id number], 1934-09-04     PCP: Tacey Ruiz, FNP   Reason for Endocrinology Evaluation: Type 2 Diabetes Mellitus  Initial Endocrine Consultative Visit: 01/03/2021    PATIENT IDENTIFIER: Kristen Weber is a 87 y.o. female with a past medical history of T2DM, CAD, HTN and Dyslipidemia. The patient has followed with Endocrinology clinic since 01/03/2021 for consultative assistance with management of her diabetes.  DIABETIC HISTORY:  Kristen Weber was diagnosed with DM yrs ago,Metformin-ineffective. Her hemoglobin A1c has ranged from 6.6% in 2016, peaking at 7.9% in 2015.  On her initial visit to our clinic she had an  A1c 6.1%, pt with recurrent hypoglycemia, she was on basal insulin and Glipizide . We stopped Glipizide, reduced basal insulin and offered add-on therapy ( GLP-1 agonist vs prandial insulin) due to low GFR of 38 . Opted for prandial insulin    SUBJECTIVE:   During the last visit (01/03/2021): A1c 6.1%. Stopped Glipizide, continued Lantus and started Novolog   Today (04/16/2021): Kristen Weber is here for a follow up on diabetes management.  She is accompanied by her daughter Steward Drone today. She checks her blood sugars 3 times daily. The patient has not had hypoglycemic episodes since the last clinic visit.  She did not bring her meter today   She denies any nausea or vomiting    HOME DIABETES REGIMEN:  Lantus 18 units with each meal  Humalog  5 units TID QAC    Statin: yes ACE-I/ARB: yes Prior Diabetic Education: yes   METER DOWNLOAD SUMMARY: Did not bring    DIABETIC COMPLICATIONS: Microvascular complications:   Neuropathy, CKD  Denies:  retinopathy  Last Eye Exam: Completed 12/2020  Macrovascular complications:   CAD, CVA  Denies:  PVD   HISTORY:  Past Medical History:  Past Medical History:  Diagnosis Date  . Abnormal cardiovascular stress test 09/20/2013   Possibly abnormal nuclear  stress at [place]-anteroseptal abnormality interpreted as possible breast attenuation as well, motion artifact. Normal EF. No TID. Watching medically  . Allergy   . Anxiety   . Cataract   . Diabetes (HCC) 09/20/2013  . Diabetes mellitus (HCC)    Type 2  . Essential hypertension 09/20/2013  . GERD (gastroesophageal reflux disease)   . H/O: hysterectomy   . Hyperlipidemia 09/20/2013  . RA (rheumatoid arthritis) (HCC)   . Sleep apnea    uses cpap  . Thyroid disease    Thyroid Nodule   Past Surgical History:  Past Surgical History:  Procedure Laterality Date  . CARDIAC CATHETERIZATION  02/12/2015  . LEFT HEART CATHETERIZATION WITH CORONARY ANGIOGRAM N/A 02/12/2015   Procedure: LEFT HEART CATHETERIZATION WITH CORONARY ANGIOGRAM;  Surgeon: Lennette Bihari, MD;  Location: [facility] CATH LAB;  Service: Cardiovascular;  Laterality: N/A;  . TOTAL ABDOMINAL HYSTERECTOMY      Social History:  reports that she has never smoked. Her smokeless tobacco use includes snuff. She reports that she does not drink alcohol and does not use drugs. Family History:  Family History  Problem Relation Age of Onset  . Diabetes Mother   . Colon cancer Neg Hx   . Prostate cancer Neg Hx   . Rectal cancer Neg Hx   . Stomach cancer Neg Hx      HOME MEDICATIONS: Allergies as of 04/16/2021      Reactions   Lisinopril Cough   cough      Medication List  Accurate as of Apr 16, 2021 10:57 AM. If you have any questions, ask your nurse or doctor.        Accu-Chek Aviva Plus test strip Generic drug: glucose blood 1 each by Other route 2 (two) times daily.   aspirin EC 81 MG tablet Take 1 tablet (81 mg total) by mouth at bedtime.   atorvastatin 20 MG tablet Commonly known as: LIPITOR Take 1 tablet (20 mg total) by mouth every morning.   diltiazem 120 MG 24 hr capsule Commonly known as: CARDIZEM CD Take 1 capsule (120 mg total) by mouth at bedtime.   FreeStyle Libre 2 Reader Devi 1 Device by Does  not apply route as directed.   FreeStyle Libre 2 Sensor Misc 1 Device by Does not apply route as directed.   insulin lispro 100 UNIT/ML KwikPen Commonly known as: HumaLOG KwikPen Inject 5 Units into the skin 3 (three) times daily.   Insulin Pen Needle 32G X 4 MM Misc 1 Device by Does not apply route in the morning, at noon, in the evening, and at bedtime.   isosorbide mononitrate 60 MG 24 hr tablet Commonly known as: IMDUR Take 1 tablet (60 mg total) by mouth every morning.   Lantus SoloStar 100 UNIT/ML Solostar Pen Generic drug: insulin glargine Inject 20 Units into the skin daily. What changed: how much to take Changed by: Scarlette Shorts, MD   loratadine 10 MG tablet Commonly known as: CLARITIN Take 10 mg by mouth daily.   losartan 25 MG tablet Commonly known as: COZAAR TAKE ONE TABLET AT BEDTIME   nitroGLYCERIN 0.4 MG SL tablet Commonly known as: Nitrostat Place 1 tablet (0.4 mg total) under the tongue every 5 (five) minutes as needed.   pantoprazole 20 MG tablet Commonly known as: Protonix Take 1 tablet (20 mg total) by mouth daily. Take 30 minutes before breakfast   topiramate 25 MG tablet Commonly known as: TOPAMAX TAKE ONE TABLET BY MOUTH EVERY MORNING and TAKE ONE TABLET AT BEDTIME   traMADol 50 MG tablet Commonly known as: ULTRAM Take 100 mg by mouth daily as needed.   VITAMIN D3 PO Take 850 mg by mouth 2 (two) times daily.        OBJECTIVE:   Vital Signs: BP 124/84   Pulse 88   Ht 5\' 3"  (1.6 m)   Wt 190 lb 2 oz (86.2 kg)   SpO2 98%   BMI 33.68 kg/m   Wt Readings from Last 3 Encounters:  04/16/21 190 lb 2 oz (86.2 kg)  04/10/21 192 lb (87.1 kg)  03/12/21 192 lb 6 oz (87.3 kg)     Exam: General: Pt appears well and is in NAD  Lungs: Clear with good BS bilat   Heart: RRR   Extremities: No pretibial edema.   Neuro: MS is good with appropriate affect, pt is alert and Ox3         DATA REVIEWED:  Lab Results  Component Value  Date   HGBA1C 7.5 (A) 04/16/2021   HGBA1C 6.1 (A) 01/03/2021   HGBA1C 6.6 (H) 02/11/2015   Lab Results  Component Value Date   MICROALBUR 8.4 (H) 01/03/2021   CREATININE 1.27 (H) 01/03/2021   Lab Results  Component Value Date   MICRALBCREAT 6.9 01/03/2021     Lab Results  Component Value Date   CHOL 184 10/17/2014   HDL 44.80 10/17/2014   LDLDIRECT 64.8 10/17/2014   TRIG 311.0 (H) 10/17/2014   CHOLHDL 4 10/17/2014  ASSESSMENT / PLAN / RECOMMENDATIONS:   1) Type 2 Diabetes Mellitus, optimally controlled, With neuropathic, CKD III and macrovascular complications   complications - Most recent A1c of 7.5%. Goal A1c <7.5%.    -Her hypoglycemic episodes have resolved. -She is doing well with MDI regimen, but the daughter would like to put the patient back on glipizide due to stress of multiple daily injections.  I did explain to them that the combination of sulfonylurea with insulin increases the risk of hypoglycemia, we discussed the fatal consequences of hypoglycemia at such advanced age given history of coronary artery disease as well as CVA. -I did offer them once weekly injection such as Trulicity but daughter has declined in the past, I did entertain this as an option today but they opted to continue with prandial insulin -Limited glycemic agents due to CKDIII  -Freestyle Josephine Igo has been sent to [employer] -I will increase her basal rate, as her fasting BG's today were in the 170s, would like to see this in the 150s if possible  MEDICATIONS: - Increase  Lantus to 20 units daily  - Humalog 5 UNITS with each meal   EDUCATION / INSTRUCTIONS:  BG monitoring instructions: Patient is instructed to check her blood sugars 3 times a day, before meals.  Call [facility] if: BG persistently < 70  . I reviewed the Rule of 15 for the treatment of hypoglycemia in detail with the patient. Literature supplied.   2) Diabetic complications:   Eye: Does not have  known diabetic retinopathy.   Neuro/ Feet: Does have known diabetic peripheral neuropathy .   Renal: Patient does  have known baseline CKD. She   is on an ACEI/ARB at present.     F/U in 6 months  Signed electronically by: Lyndle Herrlich, MD  [facility]  [facility] [address] [place], [place] [address] Phone: [phone] FAX: [phone]   CC: Tacey Ruiz, FNP [address] [place] [address] Phone: [phone]  Fax: [phone]  Return to Endocrinology clinic as below: Future Appointments  Date Time Provider Department Center  07/17/2021  8:00 AM Butch Penny, NP [facility] None  10/10/2021  9:30 AM Korie Streat, Konrad Dolores, MD [facility] None

## 2021-04-16 NOTE — Patient Instructions (Addendum)
-   Increase  Lantus to 20 units daily  - Humalog 5 UNITS with each meal     HOW TO TREAT LOW BLOOD SUGARS (Blood sugar LESS THAN 70 MG/DL)  Please follow the RULE OF 15 for the treatment of hypoglycemia treatment (when your (blood sugars are less than 70 mg/dL)    STEP 1: Take 15 grams of carbohydrates when your blood sugar is low, which includes:   3-4 GLUCOSE TABS  OR  3-4 OZ OF JUICE OR REGULAR SODA OR  ONE TUBE OF GLUCOSE GEL     STEP 2: RECHECK blood sugar in 15 MINUTES STEP 3: If your blood sugar is still low at the 15 minute recheck --> then, go back to STEP 1 and treat AGAIN with another 15 grams of carbohydrates.

## 2021-04-18 ENCOUNTER — Telehealth: Payer: Self-pay | Admitting: Internal Medicine

## 2021-04-18 MED ORDER — FREESTYLE LIBRE 2 READER DEVI: 1.0000 | 0 refills | Status: DC

## 2021-04-18 MED ORDER — FREESTYLE LIBRE 2 SENSOR MISC: 1.0000 | 11 refills | Status: DC

## 2021-04-18 NOTE — Addendum Note (Signed)
Addended by: Tawnya Crook on: 04/18/2021 03:28 PM   Modules accepted: Orders

## 2021-04-18 NOTE — Telephone Encounter (Signed)
Patient's daughter Steward Drone called re: [facility] told Patient that in order for insurance to cover Continuous Blood Gluc Receiver (FREESTYLE LIBRE 2 READER) DEVI a new RX would need to be called  into Phone# [phone] (Mail Order PHARM). Steward Drone states she does not know the name of the mail order PHARM.

## 2021-04-18 NOTE — Telephone Encounter (Signed)
So I looked up the the phone number and it was [facility].  I have printed Rx and place in [facility] inbox

## 2021-04-18 NOTE — Telephone Encounter (Signed)
Done

## 2021-04-30 NOTE — Telephone Encounter (Signed)
This has been faxed and completed.

## 2021-05-01 ENCOUNTER — Telehealth: Payer: Self-pay | Admitting: Internal Medicine

## 2021-05-01 NOTE — Telephone Encounter (Signed)
Pt daughter would like a call back regarding the dexcom

## 2021-05-05 NOTE — Addendum Note (Signed)
Encounter addended by: Novella Olive on: 05/05/2021 2:52 PM  Actions taken: Letter saved

## 2021-05-12 ENCOUNTER — Other Ambulatory Visit: Payer: Self-pay

## 2021-05-12 MED ORDER — PANTOPRAZOLE SODIUM 20 MG PO TBEC: 20.0000 mg | DELAYED_RELEASE_TABLET | Freq: Every day | ORAL | 2 refills | Status: DC

## 2021-07-08 ENCOUNTER — Telehealth: Payer: Self-pay | Admitting: Internal Medicine

## 2021-07-08 NOTE — Telephone Encounter (Signed)
Please advise 

## 2021-07-08 NOTE — Telephone Encounter (Signed)
Patients daughter called to request Sliding Scale for Insulin Following Steroid Injection - patient had injection in knee and is experiencing high blood sugars.  Call back # [phone]

## 2021-07-09 NOTE — Telephone Encounter (Signed)
Called and informed pt daughter of this information  and sent a my chart message to information

## 2021-07-17 ENCOUNTER — Encounter: Payer: Self-pay | Admitting: Adult Health

## 2021-07-17 ENCOUNTER — Other Ambulatory Visit: Payer: Self-pay

## 2021-07-17 ENCOUNTER — Ambulatory Visit (INDEPENDENT_AMBULATORY_CARE_PROVIDER_SITE_OTHER): Payer: Medicare Other | Admitting: Adult Health

## 2021-07-17 VITALS — BP 133/93 | HR 78 | Ht 63.0 in | Wt 190.0 lb

## 2021-07-17 DIAGNOSIS — G250 Essential tremor: Secondary | ICD-10-CM | POA: Diagnosis not present

## 2021-07-17 NOTE — Patient Instructions (Signed)
Your Plan:  Continue Topamax 25 mg twice a day If your symptoms worsen or you develop new symptoms please let us know.    Thank you for coming to see [place] at [facility]. I hope we have been able to provide you high quality care today.  You may receive a patient satisfaction survey over the next few weeks. We would appreciate your feedback and comments so that we may continue to improve ourselves and the health of our patients.

## 2021-07-17 NOTE — Progress Notes (Signed)
PATIENT: Kristen Weber DOB: December 11, 1933  REASON FOR VISIT: follow up HISTORY FROM: patient Primary neurologist: Dr. Lucia Gaskins  HISTORY OF PRESENT ILLNESS: Today 07/17/21:  Kristen Weber is an 87 year old female with a history of essential tremor.  She returns today for follow-up.  She is currently on Topamax 25 mg twice a day.  She reports that she tolerates this medication well.  She states that if she is anxious the tremor is worse.  She continues to notice the tremor primarily with her handwriting and holding objects.  She is able to complete all ADLs independently.  She returns today for an evaluation.  07/16/20: Kristen Weber is an 87 year old female with a history of essential tremor.  She returns today for follow-up.  She feels that her tremors may be slightly worse.  She only notices it when she is trying to tote an object or when she is writing.  She states that her tremor is much worse if she is anxious.  Daughter reports that she does have some issues with anxiety.  Patient reports that she is able to feed herself.  She dresses herself.  No trouble buttoning buttons.  She returns today for an evaluation.  Tried and failed Sinemet, propranolol, and gabapentin and primidone  HISTORY 01/17/20:   Kristen Weber is an 87 year old female with a history of essential tremor.  She returns today for follow-up.  She feels that her tremors may be worse.  She does not have any trouble feeding herself.  She does notice it with her hand rains.  Feels that the tremor in the hands get worse when she is upset.  She has tried Sinemet, propranolol, Neurontin, primidone but was unable to tolerate these medications.  She is currently on Topamax but could not tolerate at higher dose.   Her daughter is with her today.  She reports that occasionally her mom reports seeing shadows in the house.  Occasionally she may hear someone calling her name or knocking at the door.  She also notices that she has trouble with her  balance.  She does use a cane.  She recently got injections in the right knee.  She does have ongoing knee pain.  REVIEW OF SYSTEMS: Out of a complete 14 system review of symptoms, the patient complains only of the following symptoms, and all other reviewed systems are negative.  See HPI  ALLERGIES: Allergies  Allergen Reactions   Lisinopril Cough    cough    HOME MEDICATIONS: Outpatient Medications Prior to Visit  Medication Sig Dispense Refill   ACCU-CHEK AVIVA PLUS test strip 1 each by Other route 2 (two) times daily.      aspirin EC 81 MG tablet Take 1 tablet (81 mg total) by mouth at bedtime. 90 tablet 3   atorvastatin (LIPITOR) 20 MG tablet Take 1 tablet (20 mg total) by mouth every morning. 90 tablet 2   Cholecalciferol (VITAMIN D3 PO) Take 850 mg by mouth 2 (two) times daily.     Continuous Blood Gluc Receiver (FREESTYLE LIBRE 2 READER) DEVI 1 Device by Does not apply route as directed. 1 each 0   Continuous Blood Gluc Sensor (FREESTYLE LIBRE 2 SENSOR) MISC 1 Device by Does not apply route as directed. 2 each 11   diltiazem (CARDIZEM CD) 120 MG 24 hr capsule Take 1 capsule (120 mg total) by mouth at bedtime. 90 capsule 2   insulin glargine (LANTUS SOLOSTAR) 100 UNIT/ML Solostar Pen Inject 20 Units into the skin daily. 15  mL 6   insulin lispro (HUMALOG KWIKPEN) 100 UNIT/ML KwikPen Inject 5 Units into the skin 3 (three) times daily. 15 mL 11   Insulin Pen Needle 32G X 4 MM MISC 1 Device by Does not apply route in the morning, at noon, in the evening, and at bedtime. 150 each 6   isosorbide mononitrate (IMDUR) 60 MG 24 hr tablet Take 1 tablet (60 mg total) by mouth every morning. 90 tablet 2   loratadine (CLARITIN) 10 MG tablet Take 10 mg by mouth daily.     losartan (COZAAR) 25 MG tablet TAKE ONE TABLET AT BEDTIME 90 tablet 3   nitroGLYCERIN (NITROSTAT) 0.4 MG SL tablet Place 1 tablet (0.4 mg total) under the tongue every 5 (five) minutes as needed. 25 tablet 6   pantoprazole  (PROTONIX) 20 MG tablet Take 1 tablet (20 mg total) by mouth daily. Take 30 minutes before breakfast 30 tablet 2   topiramate (TOPAMAX) 25 MG tablet TAKE ONE TABLET BY MOUTH EVERY MORNING and TAKE ONE TABLET AT BEDTIME 180 tablet 3   traMADol (ULTRAM) 50 MG tablet Take 100 mg by mouth daily as needed.     No facility-administered medications prior to visit.    PAST MEDICAL HISTORY: Past Medical History:  Diagnosis Date   Abnormal cardiovascular stress test 09/20/2013   Possibly abnormal nuclear stress at [place]-anteroseptal abnormality interpreted as possible breast attenuation as well, motion artifact. Normal EF. No TID. Watching medically   Allergy    Anxiety    Cataract    Diabetes (HCC) 09/20/2013   Diabetes mellitus (HCC)    Type 2   Essential hypertension 09/20/2013   GERD (gastroesophageal reflux disease)    H/O: hysterectomy    Hyperlipidemia 09/20/2013   RA (rheumatoid arthritis) (HCC)    Sleep apnea    uses cpap   Thyroid disease    Thyroid Nodule    PAST SURGICAL HISTORY: Past Surgical History:  Procedure Laterality Date   CARDIAC CATHETERIZATION  02/12/2015   LEFT HEART CATHETERIZATION WITH CORONARY ANGIOGRAM N/A 02/12/2015   Procedure: LEFT HEART CATHETERIZATION WITH CORONARY ANGIOGRAM;  Surgeon: Lennette Bihari, MD;  Location: [facility] CATH LAB;  Service: Cardiovascular;  Laterality: N/A;   TOTAL ABDOMINAL HYSTERECTOMY      FAMILY HISTORY: Family History  Problem Relation Age of Onset   Diabetes Mother    Colon cancer Neg Hx    Prostate cancer Neg Hx    Rectal cancer Neg Hx    Stomach cancer Neg Hx     SOCIAL HISTORY: Social History   Socioeconomic History   Marital status: Widowed    Spouse name: Greggory Stallion   Number of children: 2   Years of education: 12   Highest education level: Not on file  Occupational History   Occupation: Retired  Tobacco Use   Smoking status: Never   Smokeless tobacco: Current    Types: Snuff  Vaping Use   Vaping Use: Never  used  Substance and Sexual Activity   Alcohol use: No    Alcohol/week: 0.0 standard drinks   Drug use: No   Sexual activity: Not Currently  Other Topics Concern   Not on file  Social History Narrative   Patient lives alone 03/04/17   Patient has 2 children.    Patient is retired.   Patient is right-handed.     Caffeine use: Drinks 1 cup coffee per day   Social Determinants of Corporate investment banker Strain: Not on [employer]  Insecurity: Not on file  Transportation Needs: Not on file  Physical Activity: Not on file  Stress: Not on file  Social Connections: Not on file  Intimate Partner Violence: Not on file      PHYSICAL EXAM  There were no vitals filed for this visit.  There is no height or weight on file to calculate BMI.  Generalized: Well developed, in no acute distress   Neurological examination  Mentation: Alert oriented to time, place, history taking. Follows all commands speech and language fluent Cranial nerve II-XII: Pupils were equal round reactive to light. Extraocular movements were full, visual field were full on confrontational test. Head turning and shoulder shrug  were normal and symmetric. Motor: The motor testing reveals 5 over 5 strength of all 4 extremities. Good symmetric motor tone is noted throughout.  Sensory: Sensory testing is intact to soft touch on all 4 extremities. No evidence of extinction is noted.  Coordination: Cerebellar testing reveals good finger-nose-finger and heel-to-shin bilaterally.  Mild tremor with finger-nose-finger.  No significant tremor when drawing spiral. Gait and station: Gait is slightly unsteady.  Uses a Rollator.  Tandem gait is not attempted. Reflexes: Deep tendon reflexes are symmetric and normal bilaterally.   DIAGNOSTIC DATA (LABS, IMAGING, TESTING) - I reviewed patient records, labs, notes, testing and imaging myself where available.  Lab Results  Component Value Date   WBC 3.0 (L) 09/27/2015   HGB 12.6  09/27/2015   HCT 40.4 09/27/2015   MCV 91.0 09/27/2015   PLT 156 09/27/2015      Component Value Date/Time   NA 142 01/03/2021 1428   K 3.7 01/03/2021 1428   CL 105 01/03/2021 1428   CO2 29 01/03/2021 1428   GLUCOSE 180 (H) 01/03/2021 1428   BUN 18 01/03/2021 1428   CREATININE 1.27 (H) 01/03/2021 1428   CALCIUM 10.9 (H) 01/03/2021 1428   PROT 6.2 02/11/2015 1816   ALBUMIN 3.7 02/11/2015 1816   AST 23 02/11/2015 1816   ALT 18 02/11/2015 1816   ALKPHOS 68 02/11/2015 1816   BILITOT 0.2 (L) 02/11/2015 1816   GFRNONAA 49 (L) 09/27/2015 1159   GFRAA 56 (L) 09/27/2015 1159   Lab Results  Component Value Date   CHOL 184 10/17/2014   HDL 44.80 10/17/2014   LDLDIRECT 64.8 10/17/2014   TRIG 311.0 (H) 10/17/2014   CHOLHDL 4 10/17/2014   Lab Results  Component Value Date   HGBA1C 7.5 (A) 04/16/2021   No results found for: VITAMINB12 Lab Results  Component Value Date   TSH 1.830 01/30/2016      ASSESSMENT AND PLAN 87 y.o. year old female  has a past medical history of Abnormal cardiovascular stress test (09/20/2013), Allergy, Anxiety, Cataract, Diabetes (HCC) (09/20/2013), Diabetes mellitus (HCC), Essential hypertension (09/20/2013), GERD (gastroesophageal reflux disease), H/O: hysterectomy, Hyperlipidemia (09/20/2013), RA (rheumatoid arthritis) (HCC), Sleep apnea, and Thyroid disease. here with:  Essential tremor  Continue Topamax 25 mg twice a day Advised if symptoms worsen she should let us know. In regards to anxiety advised that they should discuss with PCP Follow-up in 1 year or sooner if needed   Butch Penny, MSN, NP-C 07/17/2021, 7:48 AM [facility] [address], [address] [place], [place] [address] [phone]

## 2021-07-21 ENCOUNTER — Other Ambulatory Visit: Payer: Self-pay | Admitting: *Deleted

## 2021-07-21 MED ORDER — TOPIRAMATE 25 MG PO TABS: ORAL_TABLET | ORAL | 3 refills | Status: DC

## 2021-08-16 ENCOUNTER — Other Ambulatory Visit: Payer: Self-pay | Admitting: Cardiology

## 2021-08-18 ENCOUNTER — Other Ambulatory Visit: Payer: Self-pay

## 2021-08-18 MED ORDER — ISOSORBIDE MONONITRATE ER 60 MG PO TB24: 60.0000 mg | ORAL_TABLET | ORAL | 0 refills | Status: DC

## 2021-08-18 MED ORDER — ATORVASTATIN CALCIUM 20 MG PO TABS: 20.0000 mg | ORAL_TABLET | ORAL | 0 refills | Status: DC

## 2021-08-18 MED ORDER — DILTIAZEM HCL ER COATED BEADS 120 MG PO CP24: 120.0000 mg | ORAL_CAPSULE | Freq: Every day | ORAL | 0 refills | Status: DC

## 2021-08-22 ENCOUNTER — Other Ambulatory Visit: Payer: Self-pay

## 2021-08-22 MED ORDER — PANTOPRAZOLE SODIUM 20 MG PO TBEC: 20.0000 mg | DELAYED_RELEASE_TABLET | Freq: Every day | ORAL | 6 refills | Status: DC

## 2021-09-13 ENCOUNTER — Other Ambulatory Visit: Payer: Self-pay | Admitting: Cardiology

## 2021-10-10 ENCOUNTER — Other Ambulatory Visit: Payer: Self-pay

## 2021-10-10 ENCOUNTER — Encounter: Payer: Self-pay | Admitting: Internal Medicine

## 2021-10-10 ENCOUNTER — Ambulatory Visit (INDEPENDENT_AMBULATORY_CARE_PROVIDER_SITE_OTHER): Payer: Medicare Other | Admitting: Internal Medicine

## 2021-10-10 VITALS — BP 126/80 | HR 95 | Ht 63.0 in | Wt 188.0 lb

## 2021-10-10 DIAGNOSIS — E1142 Type 2 diabetes mellitus with diabetic polyneuropathy: Secondary | ICD-10-CM

## 2021-10-10 DIAGNOSIS — E1159 Type 2 diabetes mellitus with other circulatory complications: Secondary | ICD-10-CM | POA: Diagnosis not present

## 2021-10-10 DIAGNOSIS — Z794 Long term (current) use of insulin: Secondary | ICD-10-CM

## 2021-10-10 DIAGNOSIS — E1169 Type 2 diabetes mellitus with other specified complication: Secondary | ICD-10-CM

## 2021-10-10 DIAGNOSIS — E041 Nontoxic single thyroid nodule: Secondary | ICD-10-CM | POA: Insufficient documentation

## 2021-10-10 LAB — POCT GLYCOSYLATED HEMOGLOBIN (HGB A1C): Hemoglobin A1C: 7.8 % — AB (ref 4.0–5.6)

## 2021-10-10 NOTE — Patient Instructions (Signed)
-   Continue  Lantus 20 units daily  - Increase Humalog 6 UNITS with each meal  -Humalog correctional insulin: ADD extra units on insulin to your meal-time Humalog dose if your blood sugars are higher than 180. Use the scale below to help guide you:   Blood sugar before meal Number of units to inject  Less than 180 0 unit  181 -  220 1 units  221 -  260 2 units  261 -  300 3 units  301 -  340 4 units  341 -  380 5 units  381 -  420 6 units      HOW TO TREAT LOW BLOOD SUGARS (Blood sugar LESS THAN 70 MG/DL) Please follow the RULE OF 15 for the treatment of hypoglycemia treatment (when your (blood sugars are less than 70 mg/dL)   STEP 1: Take 15 grams of carbohydrates when your blood sugar is low, which includes:  3-4 GLUCOSE TABS  OR 3-4 OZ OF JUICE OR REGULAR SODA OR ONE TUBE OF GLUCOSE GEL    STEP 2: RECHECK blood sugar in 15 MINUTES STEP 3: If your blood sugar is still low at the 15 minute recheck --> then, go back to STEP 1 and treat AGAIN with another 15 grams of carbohydrates.

## 2021-10-10 NOTE — Progress Notes (Signed)
Name: Kristen Weber  Age/ Sex: 87 y.o., female   MRN/ DOB: [id number], [birth year]     PCP: Tacey Ruiz, FNP   Reason for Endocrinology Evaluation: Type 2 Diabetes Mellitus  Initial Endocrine Consultative Visit: 01/03/2021    PATIENT IDENTIFIER: Kristen Weber is a 87 y.o. female with a past medical history of T2DM, CAD, HTN and Dyslipidemia. The patient has followed with Endocrinology clinic since 01/03/2021 for consultative assistance with management of her diabetes.  DIABETIC HISTORY:  Kristen Weber was diagnosed with DM yrs ago,Metformin-ineffective. Her hemoglobin A1c has ranged from 6.6% in 2016, peaking at 7.9% in 2015.  On her initial visit to our clinic she had an  A1c 6.1%, pt with recurrent hypoglycemia, she was on basal insulin and Glipizide . We stopped Glipizide, reduced basal insulin and offered add-on therapy ( GLP-1 agonist vs prandial insulin) due to low GFR of 38 . Opted for prandial insulin    SUBJECTIVE:   During the last visit (04/16/2021): A1c 7.5 %. We adjusted MDI regimen     Today (10/10/2021): Kristen Weber is here for a follow up on diabetes management.  She is accompanied by her daughter Steward Drone today. She checks her blood sugars 3 times daily through freestyle libre. The patient has not had hypoglycemic episodes since the last clinic visit.    She had shingles 07/2021 Follows with neurology for essential tremors  She denies any nausea or vomiting nor diarrhea    It appears that she has been seeing Dr. Sharl Ma for thyroid nodule and Hypercalcemia , she has chronic dysphagia, no prior FNA     HOME DIABETES REGIMEN:  Lantus 20 units with each meal  Humalog  5 units TID QAC CF: HUmalog (BG-130/ 35)   Statin: yes ACE-I/ARB: yes Prior Diabetic Education: yes   CONTINUOUS GLUCOSE MONITORING RECORD INTERPRETATION    Dates of Recording: 11/5-11/18/2022  Sensor description:dexcom  Results statistics:   CGM use % of time 36  Average and SD 169/26   Time in range    63    %  % Time Above 180 32  % Time above 250 5  % Time Below target 0    Glycemic patterns summary: optimaly BG's overnight and high during the day   Hyperglycemic episodes  postprandial  Hypoglycemic episodes occurred no  Overnight periods: trends down      DIABETIC COMPLICATIONS: Microvascular complications:  Neuropathy, CKD Denies:  retinopathy Last Eye Exam: Completed 12/2020  Macrovascular complications:  CAD, CVA Denies:  PVD   HISTORY:  Past Medical History:  Past Medical History:  Diagnosis Date   Abnormal cardiovascular stress test 09/20/2013   Possibly abnormal nuclear stress at [facility]-anteroseptal abnormality interpreted as possible breast attenuation as well, motion artifact. Normal EF. No TID. Watching medically   Allergy    Anxiety    Cataract    Diabetes (HCC) 09/20/2013   Diabetes mellitus (HCC)    Type 2   Essential hypertension 09/20/2013   GERD (gastroesophageal reflux disease)    H/O: hysterectomy    Hyperlipidemia 09/20/2013   RA (rheumatoid arthritis) (HCC)    Sleep apnea    uses cpap   Thyroid disease    Thyroid Nodule   Past Surgical History:  Past Surgical History:  Procedure Laterality Date   CARDIAC CATHETERIZATION  02/12/2015   LEFT HEART CATHETERIZATION WITH CORONARY ANGIOGRAM N/A 02/12/2015   Procedure: LEFT HEART CATHETERIZATION WITH CORONARY ANGIOGRAM;  Surgeon: Lennette Bihari, MD;  Location: [facility] CATH LAB;  Service:  Cardiovascular;  Laterality: N/A;   TOTAL ABDOMINAL HYSTERECTOMY     Social History:  reports that she has never smoked. Her smokeless tobacco use includes snuff. She reports that she does not drink alcohol and does not use drugs. Family History:  Family History  Problem Relation Age of Onset   Diabetes Mother    Colon cancer Neg Hx    Prostate cancer Neg Hx    Rectal cancer Neg Hx    Stomach cancer Neg Hx      HOME MEDICATIONS: Allergies as of 10/10/2021       Reactions    Lisinopril Cough   cough        Medication List        Accurate as of October 10, 2021  9:44 AM. If you have any questions, ask your nurse or doctor.          Accu-Chek Aviva Plus test strip Generic drug: glucose blood 1 each by Other route 2 (two) times daily.   aspirin EC 81 MG tablet Take 1 tablet (81 mg total) by mouth at bedtime.   atorvastatin 20 MG tablet Commonly known as: LIPITOR TAKE ONE TABLET EVERY MORNING   diltiazem 120 MG 24 hr capsule Commonly known as: CARDIZEM CD TAKE ONE CAPSULE BY MOUTH AT BEDTIME   FreeStyle Libre 2 Reader Devi 1 Device by Does not apply route as directed.   FreeStyle Libre 2 Sensor Misc 1 Device by Does not apply route as directed.   insulin lispro 100 UNIT/ML KwikPen Commonly known as: HumaLOG KwikPen Inject 5 Units into the skin 3 (three) times daily.   Insulin Pen Needle 32G X 4 MM Misc 1 Device by Does not apply route in the morning, at noon, in the evening, and at bedtime.   isosorbide mononitrate 60 MG 24 hr tablet Commonly known as: IMDUR TAKE ONE TABLET BY MOUTH EVERY MORNING   Lantus SoloStar 100 UNIT/ML Solostar Pen Generic drug: insulin glargine Inject 20 Units into the skin daily.   loratadine 10 MG tablet Commonly known as: CLARITIN Take 10 mg by mouth daily.   losartan 25 MG tablet Commonly known as: COZAAR TAKE ONE TABLET AT BEDTIME   nitroGLYCERIN 0.4 MG SL tablet Commonly known as: Nitrostat Place 1 tablet (0.4 mg total) under the tongue every 5 (five) minutes as needed.   pantoprazole 20 MG tablet Commonly known as: Protonix Take 1 tablet (20 mg total) by mouth daily. Take 30 minutes before breakfast   topiramate 25 MG tablet Commonly known as: TOPAMAX TAKE ONE TABLET BY MOUTH EVERY MORNING and TAKE ONE TABLET AT BEDTIME   traMADol 50 MG tablet Commonly known as: ULTRAM Take 100 mg by mouth daily as needed.   VITAMIN D3 PO Take 850 mg by mouth 2 (two) times daily.          OBJECTIVE:   Vital Signs: BP 126/80 (BP Location: Left Arm, Patient Position: Sitting, Cuff Size: Small)   Pulse 95   Ht 5\' 3"  (1.6 m)   Wt 188 lb (85.3 kg)   SpO2 98%   BMI 33.30 kg/m   Wt Readings from Last 3 Encounters:  10/10/21 188 lb (85.3 kg)  07/17/21 190 lb (86.2 kg)  04/16/21 190 lb 2 oz (86.2 kg)     Exam: General: Pt appears well and is in NAD Left thyroid nodule appreciated  Lungs: Clear with good BS bilat   Heart: RRR   Extremities: No pretibial edema.   Neuro: MS  is good with appropriate affect, pt is alert and Ox3     DM Foot Exam 09/16/2021    DATA REVIEWED:  Lab Results  Component Value Date   HGBA1C 7.8 (A) 10/10/2021   HGBA1C 7.5 (A) 04/16/2021   HGBA1C 6.1 (A) 01/03/2021   Lab Results  Component Value Date   MICROALBUR 8.4 (H) 01/03/2021   CREATININE 1.27 (H) 01/03/2021   Lab Results  Component Value Date   MICRALBCREAT 6.9 01/03/2021     Lab Results  Component Value Date   CHOL 184 10/17/2014   HDL 44.80 10/17/2014   LDLDIRECT 64.8 10/17/2014   TRIG 311.0 (H) 10/17/2014   CHOLHDL 4 10/17/2014         ASSESSMENT / PLAN / RECOMMENDATIONS:   1) Type 2 Diabetes Mellitus, Sub-optimally controlled, With neuropathic, CKD III and macrovascular complications - Most recent A1c of 7.8%. Goal A1c <7.5%.    -Her A1c has trended up, will adjust insulin as below -The patient is asking about alternative glycemic agents, we did discuss limited options due to CKD III, I did offer GLP- 1 agonists in the past which they have declined in the past we also discussed SGLT2 inhibitors   MEDICATIONS: -Continue Lantus 20 units daily  -Increase Humalog 6 UNITS with each meal  -Correction factor : Humalog (BG -140/40)  EDUCATION / INSTRUCTIONS: BG monitoring instructions: Patient is instructed to check her blood sugars 3 times a day, before meals. Call [facility] if: BG persistently < 70  I reviewed the Rule of 15 for the  treatment of hypoglycemia in detail with the patient. Literature supplied.   2) Diabetic complications:  Eye: Does not have known diabetic retinopathy.  Neuro/ Feet: Does have known diabetic peripheral neuropathy .  Renal: Patient does  have known baseline CKD. She   is on an ACEI/ARB at present.    3) Thyroid nodule/hyperparathyroidism/hypercalcemia:  -While reviewing her chart today I did notice that the patient saw Dr. Sharl Ma and 07/2021.  The patient has been following up with Dr. Sharl Ma for the management of thyroid nodule and primary hyperparathyroidism. -We had a long discussion today that the patient does not need to endocrinologist to manage 2 different endocrine issues, I did recommend having 1 endocrinologist that could manage DM, thyroid nodule, hyperparathyroidism. -I have given her the option to either continue following up with me or to decide to go with Dr. Sharl Ma -She has a pending [facility] scan at [facility]     F/U in 6 months  Signed electronically by: Lyndle Herrlich, MD  [facility] Endocrinology  [facility] [address] [place] [place], [place] [address] Phone: [phone] FAX: [phone]   CC: Tacey Ruiz, FNP [address] [place] [address] Phone: [phone]  Fax: [phone]  Return to Endocrinology clinic as below: Future Appointments  Date Time Provider Department Center  07/21/2022  1:30 PM Butch Penny, NP [facility] None

## 2022-01-08 ENCOUNTER — Encounter: Payer: Self-pay | Admitting: Gastroenterology

## 2022-01-08 ENCOUNTER — Other Ambulatory Visit: Payer: Self-pay

## 2022-01-08 ENCOUNTER — Ambulatory Visit (INDEPENDENT_AMBULATORY_CARE_PROVIDER_SITE_OTHER): Payer: 59 | Admitting: Gastroenterology

## 2022-01-08 ENCOUNTER — Other Ambulatory Visit (INDEPENDENT_AMBULATORY_CARE_PROVIDER_SITE_OTHER): Payer: 59

## 2022-01-08 ENCOUNTER — Telehealth: Payer: Self-pay

## 2022-01-08 VITALS — BP 122/88 | HR 67 | Ht 63.0 in | Wt 188.1 lb

## 2022-01-08 DIAGNOSIS — K2100 Gastro-esophageal reflux disease with esophagitis, without bleeding: Secondary | ICD-10-CM | POA: Diagnosis not present

## 2022-01-08 DIAGNOSIS — K5900 Constipation, unspecified: Secondary | ICD-10-CM

## 2022-01-08 DIAGNOSIS — R1032 Left lower quadrant pain: Secondary | ICD-10-CM

## 2022-01-08 LAB — COMPREHENSIVE METABOLIC PANEL
ALT: 12 U/L (ref 0–35)
AST: 15 U/L (ref 0–37)
Albumin: 4 g/dL (ref 3.5–5.2)
Alkaline Phosphatase: 67 U/L (ref 39–117)
BUN: 13 mg/dL (ref 6–23)
CO2: 33 mEq/L — ABNORMAL HIGH (ref 19–32)
Calcium: 10.7 mg/dL — ABNORMAL HIGH (ref 8.4–10.5)
Chloride: 107 mEq/L (ref 96–112)
Creatinine, Ser: 1.04 mg/dL (ref 0.40–1.20)
GFR: 49.08 mL/min — ABNORMAL LOW (ref >60.00)
Glucose, Bld: 141 mg/dL — ABNORMAL HIGH (ref 70–99)
Potassium: 3.7 mEq/L (ref 3.5–5.1)
Sodium: 142 mEq/L (ref 135–145)
Total Bilirubin: 0.5 mg/dL (ref 0.2–1.2)
Total Protein: 6.3 g/dL (ref 6.0–8.3)

## 2022-01-08 LAB — CBC
HCT: 40.1 % (ref 36.0–46.0)
Hemoglobin: 13.2 g/dL (ref 12.0–15.0)
MCHC: 32.8 g/dL (ref 30.0–36.0)
MCV: 89.6 fl (ref 78.0–100.0)
Platelets: 151 10*3/uL (ref 150.0–400.0)
RBC: 4.47 Mil/uL (ref 3.87–5.11)
RDW: 14 % (ref 11.5–15.5)
WBC: 4 10*3/uL (ref 4.0–10.5)

## 2022-01-08 MED ORDER — PANTOPRAZOLE SODIUM 20 MG PO TBEC: 20.0000 mg | DELAYED_RELEASE_TABLET | Freq: Every day | ORAL | 2 refills | Status: DC

## 2022-01-08 NOTE — Progress Notes (Signed)
0

## 2022-01-08 NOTE — Patient Instructions (Signed)
Please go to the lab on the 2nd floor [address] before you leave the office today.   Go to the first floor radiology and schedule your CT before you leave  We have sent the following medications to your pharmacy for you to pick up at your convenience:  Protonix 20mg  daily  It was a pleasure to see you today!  Lynann Bologna, M.D.

## 2022-01-08 NOTE — Progress Notes (Signed)
Chief Complaint: Dysphagia  Referring Provider:  Tacey Ruiz, FNP      ASSESSMENT AND PLAN;   #1. GERD. No further eso dysphagia. #2. Abdo pain LLQ #3. Constipation  Plan: -Protonix 20 mg p.o. QD, 1/2 hr before breakfast. #30.  2 refills. -CT AP with PO contrast only (No IV contrast d/t CKD) -CBC, CMP, TSH (cc Dr Napoleon Form @ [place] endocrine) -Miralax 17 g po qd -Continue senna as before. -D/W daughter in detail.      HPI:    Kristen Weber is a 88 y.o. female  CAD (Nl EF), DM2, CKD3, OSA on CPAP, TIA, hypothyroidism, thyroid nodules Accompanied by her daughter who is a patient of ours  No further dysphagia after dilatation 03/2021 except for occasions when she tries to eat fast and is under stress or hurry.  Main problem today is that of constipation requiring senna. Occ LLQ pain with bloating. Several medications causing constipation as well including calcium channel blockers Took MiraLAX with good relief Again refusing colonoscopy. No melena or hematochezia  Never had colonoscopy and "doesn't want one".  Was found to have elevated calcium-being followed by Dr. Sharl Ma [facility] endocrinology)  No weight loss.    Past GI work-up: Barium swallow 03/2021 1. Distal esophageal mucosal ring measures at most 0.9 cm in diameter. Rings of this size are expected to cause symptoms, and dilation should be considered. 2. Small type 1 hiatal hernia. 3. Occasional secondary and tertiary contractions in the esophagus although primary peristaltic waves are not disrupted. 4. The patient has some limited oral control and was unable to swallow the pill. Speech pathology assessment should be considered.  EGD 03/2021 with dilation - Non-obstructing and moderate distal esophageal ring. Dilated. - 2 cm hiatal hernia. - Gastritis. Biopsied.  Refused colonoscopy.  "Never had one and does not want one"  Past Medical History:  Diagnosis Date   Abnormal cardiovascular stress  test 09/20/2013   Possibly abnormal nuclear stress at [facility]-anteroseptal abnormality interpreted as possible breast attenuation as well, motion artifact. Normal EF. No TID. Watching medically   Allergy    Anxiety    Cataract    Diabetes (HCC) 09/20/2013   Diabetes mellitus (HCC)    Type 2   Essential hypertension 09/20/2013   GERD (gastroesophageal reflux disease)    H/O: hysterectomy    Hyperlipidemia 09/20/2013   RA (rheumatoid arthritis) (HCC)    Sleep apnea    uses cpap   Thyroid disease    Thyroid Nodule    Past Surgical History:  Procedure Laterality Date   CARDIAC CATHETERIZATION  02/12/2015   LEFT HEART CATHETERIZATION WITH CORONARY ANGIOGRAM N/A 02/12/2015   Procedure: LEFT HEART CATHETERIZATION WITH CORONARY ANGIOGRAM;  Surgeon: Lennette Bihari, MD;  Location: [facility] CATH LAB;  Service: Cardiovascular;  Laterality: N/A;   TOTAL ABDOMINAL HYSTERECTOMY      Family History  Problem Relation Age of Onset   Diabetes Mother    Colon cancer Neg Hx    Prostate cancer Neg Hx    Rectal cancer Neg Hx    Stomach cancer Neg Hx     Social History   Tobacco Use   Smoking status: Never   Smokeless tobacco: Current    Types: Snuff  Vaping Use   Vaping Use: Never used  Substance Use Topics   Alcohol use: No    Alcohol/week: 0.0 standard drinks   Drug use: No    Current Outpatient Medications  Medication Sig Dispense Refill   ACCU-CHEK  AVIVA PLUS test strip 1 each by Other route 2 (two) times daily.      aspirin EC 81 MG tablet Take 1 tablet (81 mg total) by mouth at bedtime. 90 tablet 3   atorvastatin (LIPITOR) 20 MG tablet TAKE ONE TABLET EVERY MORNING 30 tablet 4   Cholecalciferol (VITAMIN D3 PO) Take 850 mg by mouth 2 (two) times daily.     Continuous Blood Gluc Receiver (FREESTYLE LIBRE 2 READER) DEVI 1 Device by Does not apply route as directed. 1 each 0   Continuous Blood Gluc Sensor (FREESTYLE LIBRE 2 SENSOR) MISC 1 Device by Does not apply route as directed. 2  each 11   diltiazem (CARDIZEM CD) 120 MG 24 hr capsule TAKE ONE CAPSULE BY MOUTH AT BEDTIME 30 capsule 4   insulin glargine (LANTUS SOLOSTAR) 100 UNIT/ML Solostar Pen Inject 20 Units into the skin daily. 15 mL 6   insulin lispro (HUMALOG KWIKPEN) 100 UNIT/ML KwikPen Inject 5 Units into the skin 3 (three) times daily. 15 mL 11   Insulin Pen Needle 32G X 4 MM MISC 1 Device by Does not apply route in the morning, at noon, in the evening, and at bedtime. 150 each 6   isosorbide mononitrate (IMDUR) 60 MG 24 hr tablet TAKE ONE TABLET BY MOUTH EVERY MORNING 30 tablet 4   loratadine (CLARITIN) 10 MG tablet Take 10 mg by mouth daily.     losartan (COZAAR) 25 MG tablet TAKE ONE TABLET AT BEDTIME 90 tablet 3   nitroGLYCERIN (NITROSTAT) 0.4 MG SL tablet Place 1 tablet (0.4 mg total) under the tongue every 5 (five) minutes as needed. 25 tablet 6   pantoprazole (PROTONIX) 20 MG tablet Take 1 tablet (20 mg total) by mouth daily. Take 30 minutes before breakfast 30 tablet 6   topiramate (TOPAMAX) 25 MG tablet TAKE ONE TABLET BY MOUTH EVERY MORNING and TAKE ONE TABLET AT BEDTIME 180 tablet 3   traMADol (ULTRAM) 50 MG tablet Take 100 mg by mouth daily as needed.     No current facility-administered medications for this visit.    Allergies  Allergen Reactions   Lisinopril Cough    cough    Review of Systems:  neg     Physical Exam:    BP 122/88    Pulse 67    Ht 5\' 3"  (1.6 m)    Wt 188 lb 2 oz (85.3 kg)    SpO2 97%    BMI 33.32 kg/m  Wt Readings from Last 3 Encounters:  01/08/22 188 lb 2 oz (85.3 kg)  10/10/21 188 lb (85.3 kg)  07/17/21 190 lb (86.2 kg)   Constitutional:  Well-developed, in no acute distress. Psychiatric: Normal mood and affect. Behavior is normal. HEENT:  Conjunctivae are normal. No scleral icterus. Cardiovascular: Normal rate, regular rhythm. No edema Pulmonary/chest: Effort normal and breath sounds normal. No wheezing, rales or rhonchi. Abdominal: Soft, nondistended.  Nontender. Bowel sounds active throughout. There are no masses palpable. No hepatomegaly. Rectal: Deferred Neurological: Alert and oriented to person place and time. Skin: Skin is warm and dry. No rashes noted.  Data Reviewed: I have personally reviewed following labs and imaging studies  CBC: CBC Latest Ref Rng & Units 09/27/2015 02/12/2015 02/11/2015  WBC 4.0 - 10.5 K/uL 3.0(L) 3.2(L) 3.5(L)  Hemoglobin 12.0 - 15.0 g/dL 12.6 12.0 13.4  Hematocrit 36.0 - 46.0 % 40.4 38.6 41.1  Platelets 150 - 400 K/uL 156 157 180    CMP: CMP Latest Ref Rng &  Units 01/03/2021 09/27/2015 02/11/2015  Glucose 70 - 99 mg/dL 180(H) 103(H) 102(H)  BUN 6 - 23 mg/dL 18 15 18   Creatinine 0.40 - 1.20 mg/dL 1.27(H) 1.06(H) 0.94  Sodium 135 - 145 mEq/L 142 138 137  Potassium 3.5 - 5.1 mEq/L 3.7 3.7 3.9  Chloride 96 - 112 mEq/L 105 105 100  CO2 19 - 32 mEq/L 29 27 31   Calcium 8.4 - 10.5 mg/dL 10.9(H) 10.5(H) 10.3  Total Protein 6.0 - 8.3 g/dL - - 6.2  Total Bilirubin 0.3 - 1.2 mg/dL - - 0.2(L)  Alkaline Phos 39 - 117 U/L - - 68  AST 0 - 37 U/L - - 23  ALT 0 - 35 U/L - - 18     Carmell Austria, MD 01/08/2022, 11:17 AM  Cc: Alvera Singh, FNP

## 2022-01-08 NOTE — Telephone Encounter (Signed)
Spoke with Steward Drone [phone]), pt's daughter, regarding scheduling an appointment for her mother for CT. Stated she will call [facility] radiology tomorrow to make an appointment by herself. Stated she already has their contact number. Tessa Lerner is advised to receive contrast from radiology. Instruction on how to take contrast is provided over the phone. Steward Drone voiced understanding.

## 2022-01-09 NOTE — Telephone Encounter (Signed)
Steward Drone patients daughter called and is wanting to discuss with you some information in regards to her mothers CT scan on 2/22. Please advise.

## 2022-01-09 NOTE — Telephone Encounter (Signed)
Spoke with Steward DroneBrenda in regards to CT of her mother. Stated she already spoke with Viviann SpareSteven and  is expecting a call from him. Informed Steward DroneBrenda to call me if she has any questions or concerns. I will follow up with her on Monday.

## 2022-01-12 ENCOUNTER — Other Ambulatory Visit: Payer: Self-pay | Admitting: Cardiology

## 2022-01-12 NOTE — Telephone Encounter (Signed)
Followed up with Kristen Weber. Stated she would take her mother for CT scan on 2/22 at [facility] radiology. Had no further questions and ended the call.

## 2022-01-14 ENCOUNTER — Telehealth: Payer: Self-pay | Admitting: Gastroenterology

## 2022-01-14 ENCOUNTER — Other Ambulatory Visit: Payer: Self-pay

## 2022-01-14 ENCOUNTER — Ambulatory Visit (HOSPITAL_BASED_OUTPATIENT_CLINIC_OR_DEPARTMENT_OTHER)
Admission: RE | Admit: 2022-01-14 | Discharge: 2022-01-14 | Disposition: A | Discharge status: Home / Self Care | Payer: 59 | Source: Ambulatory Visit | Attending: Gastroenterology | Admitting: Gastroenterology

## 2022-01-14 ENCOUNTER — Encounter (HOSPITAL_BASED_OUTPATIENT_CLINIC_OR_DEPARTMENT_OTHER): Payer: Self-pay

## 2022-01-14 DIAGNOSIS — K5900 Constipation, unspecified: Secondary | ICD-10-CM

## 2022-01-14 DIAGNOSIS — R1032 Left lower quadrant pain: Secondary | ICD-10-CM

## 2022-01-14 MED ORDER — IOHEXOL 300 MG/ML  SOLN
100.0000 mL | Freq: Once | INTRAMUSCULAR | Status: AC | PRN
  Administered 2022-01-14: 100 mL via INTRAVENOUS

## 2022-01-14 NOTE — Telephone Encounter (Signed)
Pt's daughter came by stating the pt had a CT scan done today and she had it with contrast. Per daughter Dr Chales Abrahams requested no contrast, caller would like a call back as soon as possible  CB [phone]

## 2022-01-14 NOTE — Telephone Encounter (Signed)
Brooke, Please reassure the daughter that creatinine was normal With contrast gives [place] more information Hence, I gave the order to get it done with contrast  RG

## 2022-01-14 NOTE — Telephone Encounter (Signed)
I verified with imaging she did have IV contrast. I called Steward Drone and she said patient is doing fine at the moment but they are upset with what has happened. Any recommendation at this moment?

## 2022-01-15 NOTE — Telephone Encounter (Signed)
Patient's daughter made aware and she said that patient is doing fine and told her that she will be able to see the result in mychart and that we usually have to give 2 days once it results to call to make them aware but they can see it on mychart as soon as it post in epic. She understood

## 2022-01-20 ENCOUNTER — Other Ambulatory Visit: Payer: Self-pay

## 2022-01-20 DIAGNOSIS — I723 Aneurysm of iliac artery: Secondary | ICD-10-CM

## 2022-01-21 NOTE — Telephone Encounter (Signed)
Called daughter and she said that patient is doing fine ?

## 2022-01-28 ENCOUNTER — Ambulatory Visit: Payer: Medicaid Other | Admitting: Cardiology

## 2022-02-04 ENCOUNTER — Ambulatory Visit: Payer: Medicare Other | Admitting: Cardiology

## 2022-02-09 NOTE — Progress Notes (Signed)
[facility] ? ?ASSESSMENT / PLAN: ?88 y.o. female with left internal iliac artery aneurysm, nearly thrombosed, measuring 27 mm in greatest diameter.  I counseled the patient extensively about these findings.  Follow-up with me in 1 year after CT scan. ? ?CHIEF COMPLAINT: follow up CT scan ? ?HISTORY OF PRESENT ILLNESS: ?Kristen Weber is a 88 y.o. female referred to clinic for evaluation after incidental discovery of an internal iliac artery aneurysm on CT scan of the abdomen and pelvis.  The patient has had problems with dysphagia prior to a upper endoscopy and esophageal dilation 04/10/2021.  She returned to her gastroenterologist last month reporting chronic constipation.  A CT scan of her abdomen pelvis was performed.  Incidentally noted was a left internal iliac artery aneurysm with a large burden of mural thrombus.  The patient is asymptomatic from an aneurysm standpoint.  I counseled her extensively about the natural history of aneurysmal disease.  We reviewed the threshold for treatment of 35 mm.  I explained the likely slow growth rate of these aneurysms. ? ?Past Medical History:  ?Diagnosis Date  ? Abnormal cardiovascular stress test 09/20/2013  ? Possibly abnormal nuclear stress at [facility]-anteroseptal abnormality interpreted as possible breast attenuation as well, motion artifact. Normal EF. No TID. Watching medically  ? Allergy   ? Anxiety   ? Cataract   ? Diabetes (HCC) 09/20/2013  ? Diabetes mellitus (HCC)   ? Type 2  ? Essential hypertension 09/20/2013  ? GERD (gastroesophageal reflux disease)   ? H/O: hysterectomy   ? Hyperlipidemia 09/20/2013  ? RA (rheumatoid arthritis) (HCC)   ? Sleep apnea   ? uses cpap  ? Thyroid disease   ? Thyroid Nodule  ? ? ?Past Surgical History:  ?Procedure Laterality Date  ? CARDIAC CATHETERIZATION  02/12/2015  ? LEFT HEART CATHETERIZATION WITH CORONARY ANGIOGRAM N/A 02/12/2015  ? Procedure: LEFT HEART CATHETERIZATION WITH CORONARY ANGIOGRAM;   Surgeon: Lennette Biharihomas A Kelly, MD;  [facility];  Service: Cardiovascular;  Laterality: N/A;  ? TOTAL ABDOMINAL HYSTERECTOMY    ? ? ?Family History  ?Problem Relation Age of Onset  ? Diabetes Mother   ? Colon cancer Neg Hx   ? Prostate cancer Neg Hx   ? Rectal cancer Neg Hx   ? Stomach cancer Neg Hx   ? ? ?Social History  ? ?Socioeconomic History  ? Marital status: Widowed  ?  Spouse name: Greggory StallionGeorge  ? Number of children: 2  ? Years of education: 6712  ? Highest education level: Not on file  ?Occupational History  ? Occupation: Retired  ?Tobacco Use  ? Smoking status: Never  ? Smokeless tobacco: Current  ?  Types: Snuff  ?Vaping Use  ? Vaping Use: Never used  ?Substance and Sexual Activity  ? Alcohol use: No  ?  Alcohol/week: 0.0 standard drinks  ? Drug use: No  ? Sexual activity: Not Currently  ?Other Topics Concern  ? Not on file  ?Social History Narrative  ? Patient lives alone 03/04/17  ? Patient has 2 children.   ? Patient is retired.  ? Patient is right-handed.    ? Caffeine use: Drinks 1 cup coffee per day  ? ?Social Determinants of Health  ? ?Financial Resource Strain: Not on file  ?Food Insecurity: Not on file  ?Transportation Needs: Not on file  ?Physical Activity: Not on file  ?Stress: Not on file  ?Social Connections: Not on file  ?Intimate Partner Violence: Not on file  ? ? ?  Allergies  ?Allergen Reactions  ? Lisinopril Cough  ?  cough  ? ? ?Current Outpatient Medications  ?Medication Sig Dispense Refill  ? ACCU-CHEK AVIVA PLUS test strip 1 each by Other route 2 (two) times daily.     ? aspirin EC 81 MG tablet Take 1 tablet (81 mg total) by mouth at bedtime. 90 tablet 3  ? atorvastatin (LIPITOR) 20 MG tablet TAKE ONE TABLET EVERY MORNING 30 tablet 4  ? Cholecalciferol (VITAMIN D3 PO) Take 850 mg by mouth 2 (two) times daily.    ? Continuous Blood Gluc Receiver (FREESTYLE LIBRE 2 READER) DEVI 1 Device by Does not apply route as directed. 1 each 0  ? Continuous Blood Gluc Sensor (FREESTYLE LIBRE 2 SENSOR)  MISC 1 Device by Does not apply route as directed. 2 each 11  ? diltiazem (CARDIZEM CD) 120 MG 24 hr capsule TAKE ONE CAPSULE BY MOUTH AT BEDTIME 30 capsule 4  ? insulin glargine (LANTUS SOLOSTAR) 100 UNIT/ML Solostar Pen Inject 20 Units into the skin daily. 15 mL 6  ? insulin lispro (HUMALOG KWIKPEN) 100 UNIT/ML KwikPen Inject 5 Units into the skin 3 (three) times daily. 15 mL 11  ? Insulin Pen Needle 32G X 4 MM MISC 1 Device by Does not apply route in the morning, at noon, in the evening, and at bedtime. 150 each 6  ? isosorbide mononitrate (IMDUR) 60 MG 24 hr tablet TAKE ONE TABLET BY MOUTH EVERY MORNING 30 tablet 4  ? loratadine (CLARITIN) 10 MG tablet Take 10 mg by mouth daily.    ? losartan (COZAAR) 25 MG tablet TAKE ONE TABLET AT BEDTIME 30 tablet 0  ? nitroGLYCERIN (NITROSTAT) 0.4 MG SL tablet Place 1 tablet (0.4 mg total) under the tongue every 5 (five) minutes as needed. 25 tablet 6  ? pantoprazole (PROTONIX) 20 MG tablet Take 1 tablet (20 mg total) by mouth daily. Take 30 minutes before breakfast 30 tablet 2  ? topiramate (TOPAMAX) 25 MG tablet TAKE ONE TABLET BY MOUTH EVERY MORNING and TAKE ONE TABLET AT BEDTIME 180 tablet 3  ? traMADol (ULTRAM) 50 MG tablet Take 100 mg by mouth daily as needed.    ? ?No current facility-administered medications for this visit.  ? ? ?PHYSICAL EXAM ?Vitals:  ? 02/10/22 0906  ?BP: 116/79  ?Pulse: 84  ?Resp: 20  ?Temp: 98.4 ?F (36.9 ?C)  ?SpO2: 96%  ?Weight: 188 lb (85.3 kg)  ?Height:  (1.6 m)  ? ?Well-appearing woman in no acute distress.  In a wheelchair. ?Regular rate and rhythm ?Unlabored breathing ?Soft, nondistended abdomen ? ?PERTINENT LABORATORY AND RADIOLOGIC DATA ? ?Most recent CBC ?CBC Latest Ref Rng & Units 01/08/2022 09/27/2015 02/12/2015  ?WBC 4.0 - 10.5 K/uL 4.0 3.0(L) 3.2(L)  ?Hemoglobin 12.0 - 15.0 g/dL 16.1 09.6 04.5  ?Hematocrit 36.0 - 46.0 % 40.1 40.4 38.6  ?Platelets 150.0 - 400.0 K/uL 151.0 156 157  ?  ? ?Most recent CMP ?CMP Latest Ref Rng & Units  01/08/2022 01/03/2021 09/27/2015  ?Glucose 70 - 99 mg/dL 409(W) 119(J) 478(G)  ?BUN 6 - 23 mg/dL ?Creatinine 0.40 - 1.20 mg/dL 9.56 2.13(Y) 8.65(H)  ?Sodium 135 - 145 mEq/L 142 142 138  ?Potassium 3.5 - 5.1 mEq/L 3.7 3.7 3.7  ?Chloride 96 - 112 mEq/L 107 105 105  ?CO2 19 - 32 mEq/L 33(H) 29 27  ?Calcium 8.4 - 10.5 mg/dL 10.7(H) 10.9(H) 10.5(H)  ?Total Protein 6.0 - 8.3 g/dL 6.3 - -  ?Total  Bilirubin 0.2 - 1.2 mg/dL 0.5 - -  ?Alkaline Phos 39 - 117 U/L 67 - -  ?AST 0 - 37 U/L 15 - -  ?ALT 0 - 35 U/L 12 - -  ? ? ?Renal function ?CrCl cannot be calculated (Patient's most recent lab result is older than the maximum 21 days allowed.). ? ?Hemoglobin A1C (%)  ?Date Value  ?10/10/2021 7.8 (A)  ? ?Hgb A1c MFr Bld (%)  ?Date Value  ?02/11/2015 6.6 (H)  ? ? ?Direct LDL  ?Date Value Ref Range Status  ?10/17/2014 64.8 mg/dL Final  ?  Comment:  ?  Optimal:  <100 mg/dLNear or Above Optimal:  100-129 mg/dLBorderline High:  130-159 mg/dLHigh:  160-189 mg/dLVery High:  >190 mg/dL  ?  ?CT scan of the abdomen pelvis 01/14/2022.  Personally reviewed.  Moderate sized left internal iliac artery aneurysm measuring 27 mm.  This appears nearly thrombosed. ? ?Rande Brunt. Lenell Antu, MD ?[facility] ?Office Phone Number: [phone] ?02/10/2022 2:11 PM ? ?Total time spent on preparing this encounter including chart review, data review, collecting history, examining the patient, coordinating care for this new patient, 60 minutes. ? ?Portions of this report may have been transcribed using voice recognition software.  Every effort has been made to ensure accuracy; however, inadvertent computerized transcription errors may still be present. ? ? ? ?

## 2022-02-10 ENCOUNTER — Ambulatory Visit (INDEPENDENT_AMBULATORY_CARE_PROVIDER_SITE_OTHER): Payer: 59 | Admitting: Vascular Surgery

## 2022-02-10 ENCOUNTER — Other Ambulatory Visit: Payer: Self-pay

## 2022-02-10 ENCOUNTER — Encounter: Payer: Self-pay | Admitting: Vascular Surgery

## 2022-02-10 VITALS — BP 116/79 | HR 84 | Temp 98.4°F | Resp 20 | Ht 63.0 in | Wt 188.0 lb

## 2022-02-10 DIAGNOSIS — I723 Aneurysm of iliac artery: Secondary | ICD-10-CM | POA: Diagnosis not present

## 2022-02-14 ENCOUNTER — Other Ambulatory Visit: Payer: Self-pay | Admitting: Cardiology

## 2022-02-17 ENCOUNTER — Other Ambulatory Visit: Payer: Self-pay

## 2022-02-17 MED ORDER — INSULIN LISPRO (1 UNIT DIAL) 100 UNIT/ML (KWIKPEN): 5.0000 [IU] | PEN_INJECTOR | Freq: Three times a day (TID) | SUBCUTANEOUS | 3 refills | Status: DC

## 2022-03-14 ENCOUNTER — Other Ambulatory Visit: Payer: Self-pay | Admitting: Cardiology

## 2022-04-17 ENCOUNTER — Ambulatory Visit: Payer: Medicare Other | Admitting: Internal Medicine

## 2022-04-30 ENCOUNTER — Encounter: Payer: Self-pay | Admitting: Internal Medicine

## 2022-04-30 ENCOUNTER — Ambulatory Visit (INDEPENDENT_AMBULATORY_CARE_PROVIDER_SITE_OTHER): Payer: 59 | Admitting: Internal Medicine

## 2022-04-30 VITALS — BP 124/76 | HR 80 | Ht 63.0 in | Wt 185.0 lb

## 2022-04-30 DIAGNOSIS — E041 Nontoxic single thyroid nodule: Secondary | ICD-10-CM

## 2022-04-30 DIAGNOSIS — E1142 Type 2 diabetes mellitus with diabetic polyneuropathy: Secondary | ICD-10-CM | POA: Diagnosis not present

## 2022-04-30 DIAGNOSIS — E1159 Type 2 diabetes mellitus with other circulatory complications: Secondary | ICD-10-CM | POA: Diagnosis not present

## 2022-04-30 DIAGNOSIS — Z794 Long term (current) use of insulin: Secondary | ICD-10-CM | POA: Diagnosis not present

## 2022-04-30 LAB — BASIC METABOLIC PANEL
BUN: 14 mg/dL (ref 6–23)
CO2: 28 mEq/L (ref 19–32)
Calcium: 10.9 mg/dL — ABNORMAL HIGH (ref 8.4–10.5)
Chloride: 107 mEq/L (ref 96–112)
Creatinine, Ser: 1.21 mg/dL — ABNORMAL HIGH (ref 0.40–1.20)
GFR: 40.83 mL/min — ABNORMAL LOW (ref >60.00)
Glucose, Bld: 104 mg/dL — ABNORMAL HIGH (ref 70–99)
Potassium: 3.4 mEq/L — ABNORMAL LOW (ref 3.5–5.1)
Sodium: 141 mEq/L (ref 135–145)

## 2022-04-30 LAB — POCT GLYCOSYLATED HEMOGLOBIN (HGB A1C): Hemoglobin A1C: 7 % — AB (ref 4.0–5.6)

## 2022-04-30 LAB — VITAMIN D 25 HYDROXY (VIT D DEFICIENCY, FRACTURES): VITD: 42.58 ng/mL (ref 30.00–100.00)

## 2022-04-30 LAB — T4, FREE: Free T4: 0.77 ng/dL (ref 0.60–1.60)

## 2022-04-30 LAB — ALBUMIN: Albumin: 4.1 g/dL (ref 3.5–5.2)

## 2022-04-30 LAB — TSH: TSH: 4.03 u[IU]/mL (ref 0.35–5.50)

## 2022-04-30 NOTE — Patient Instructions (Addendum)
-   Decrease  Lantus 16 units daily  - Continue Humalog 6 UNITS with each meal  -Humalog correctional insulin: ADD extra units on insulin to your meal-time Humalog dose if your blood sugars are higher than 180. Use the scale below to help guide you:   Blood sugar before meal Number of units to inject  Less than 180 0 unit  181 -  220 1 units  221 -  260 2 units  261 -  300 3 units  301 -  340 4 units  341 -  380 5 units  381 -  420 6 units      HOW TO TREAT LOW BLOOD SUGARS (Blood sugar LESS THAN 70 MG/DL) Please follow the RULE OF 15 for the treatment of hypoglycemia treatment (when your (blood sugars are less than 70 mg/dL)   STEP 1: Take 15 grams of carbohydrates when your blood sugar is low, which includes:  3-4 GLUCOSE TABS  OR 3-4 OZ OF JUICE OR REGULAR SODA OR ONE TUBE OF GLUCOSE GEL    STEP 2: RECHECK blood sugar in 15 MINUTES STEP 3: If your blood sugar is still low at the 15 minute recheck --> then, go back to STEP 1 and treat AGAIN with another 15 grams of carbohydrates.

## 2022-04-30 NOTE — Progress Notes (Signed)
Name: Kristen Weber  Age/ Sex: 88 y.o., female   MRN/ DOB: [id number], [birth year]     PCP: Tacey Ruiz, FNP   Reason for Endocrinology Evaluation: Type 2 Diabetes Mellitus  Initial Endocrine Consultative Visit: 01/03/2021    PATIENT IDENTIFIER: Kristen Weber is a 87 y.o. female with a past medical history of T2DM, CAD, HTN and Dyslipidemia. The patient has followed with Endocrinology clinic since 01/03/2021 for consultative assistance with management of her diabetes.  DIABETIC HISTORY:  Kristen Weber was diagnosed with DM yrs ago,Metformin-ineffective. Her hemoglobin A1c has ranged from 6.6% in 2016, peaking at 7.9% in 2015.  On her initial visit to our clinic she had an  A1c 6.1%, pt with recurrent hypoglycemia, she was on basal insulin and Glipizide . We stopped Glipizide, reduced basal insulin and offered add-on therapy ( GLP-1 agonist vs prandial insulin) due to low GFR of 38 . Opted for prandial insulin     MNG/PARATHYROID HISTORY:  Patient has been following up with Dr. Sharl Ma for multinodular goiter and hypercalcemia for many years. She has no history of thyroid nodule FNAs in the past   SUBJECTIVE:   During the last visit (04/16/2021): A1c 7.5 %. We adjusted MDI regimen     Today (04/30/2022): Kristen Weber is here for a follow up on diabetes management.  She is accompanied by her daughter Kristen Weber today. She checks her blood sugars 3 times daily through freestyle libre. The patient has not had hypoglycemic episodes since the last clinic visit.    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  Follows with neurology for essential tremors  She denies any nausea or vomiting nor diarrhea   Denies local neck swelling   HOME DIABETES REGIMEN:  Lantus 18 units with each meal  Humalog  6 units TID QAC CF: HUmalog (BG-130/ 35) Vitamin D 2000 iu daily     Statin: yes ACE-I/ARB: yes Prior Diabetic Education: yes   CONTINUOUS GLUCOSE MONITORING RECORD  INTERPRETATION    Dates of Recording:5/26-04/30/2022  Sensor description:dexcom  Results statistics:   CGM use % of time 51  Average and SD 137/34.3  Time in range   73   %  % Time Above 180 18  % Time above 250 1  % Time Below target 0    Glycemic patterns summary: optimaly BG's overnight and high during the day   Hyperglycemic episodes  postprandial  Hypoglycemic episodes occurred fasting   Overnight periods: trends down      DIABETIC COMPLICATIONS: Microvascular complications:  Neuropathy, CKD Denies:  retinopathy Last Eye Exam: Completed 12/2020  Macrovascular complications:  CAD, CVA Denies:  PVD   HISTORY:  Past Medical History:  Past Medical History:  Diagnosis Date   Abnormal cardiovascular stress test 09/20/2013   Possibly abnormal nuclear stress at [facility]-anteroseptal abnormality interpreted as possible breast attenuation as well, motion artifact. Normal EF. No TID. Watching medically   Allergy    Anxiety    Cataract    Diabetes (HCC) 09/20/2013   Diabetes mellitus (HCC)    Type 2   Essential hypertension 09/20/2013   GERD (gastroesophageal reflux disease)    H/O: hysterectomy    Hyperlipidemia 09/20/2013   RA (rheumatoid arthritis) (HCC)    Sleep apnea    uses cpap   Thyroid disease    Thyroid Nodule   Past Surgical History:  Past Surgical History:  Procedure Laterality Date   CARDIAC CATHETERIZATION  02/12/2015   LEFT HEART CATHETERIZATION WITH  CORONARY ANGIOGRAM N/A 02/12/2015   Procedure: LEFT HEART CATHETERIZATION WITH CORONARY ANGIOGRAM;  Surgeon: Lennette Bihari, MD;  Location: [facility] CATH LAB;  Service: Cardiovascular;  Laterality: N/A;   TOTAL ABDOMINAL HYSTERECTOMY     Social History:  reports that she has never smoked. Her smokeless tobacco use includes snuff. She reports that she does not drink alcohol and does not use drugs. Family History:  Family History  Problem Relation Age of Onset   Diabetes Mother    Colon cancer Neg Hx     Prostate cancer Neg Hx    Rectal cancer Neg Hx    Stomach cancer Neg Hx      HOME MEDICATIONS: Allergies as of 04/30/2022       Reactions   Lisinopril Cough   cough        Medication List        Accurate as of April 30, 2022 10:37 AM. If you have any questions, ask your nurse or doctor.          Accu-Chek Aviva Plus test strip Generic drug: glucose blood 1 each by Other route 2 (two) times daily.   aspirin EC 81 MG tablet Take 1 tablet (81 mg total) by mouth at bedtime.   atorvastatin 20 MG tablet Commonly known as: LIPITOR Take 1 tablet (20 mg total) by mouth every morning. Please keep upcoming appt. With Dr. Anne Fu in Sept. In order to receive further refills.Thank You.   diltiazem 120 MG 24 hr capsule Commonly known as: CARDIZEM CD Take 1 capsule (120 mg total) by mouth at bedtime. Please keep upcoming appt. With Dr. Anne Fu in Sept. In order to receive further refills.Thank You.   FreeStyle Libre 2 Reader Devi 1 Device by Does not apply route as directed.   FreeStyle Libre 2 Sensor Misc 1 Device by Does not apply route as directed.   insulin lispro 100 UNIT/ML KwikPen Commonly known as: HumaLOG KwikPen Inject 5 Units into the skin 3 (three) times daily.   Insulin Pen Needle 32G X 4 MM Misc 1 Device by Does not apply route in the morning, at noon, in the evening, and at bedtime.   isosorbide mononitrate 60 MG 24 hr tablet Commonly known as: IMDUR Take 1 tablet (60 mg total) by mouth every morning. Please keep upcoming appt. With Dr. Anne Fu in Sept. In order to receive further refills.Thank You.   Lantus SoloStar 100 UNIT/ML Solostar Pen Generic drug: insulin glargine Inject 20 Units into the skin daily. What changed: how much to take   loratadine 10 MG tablet Commonly known as: CLARITIN Take 10 mg by mouth daily.   losartan 25 MG tablet Commonly known as: COZAAR Take 1 tablet (25 mg total) by mouth at bedtime. Please keep upcoming appt. In Sept.  With Dr. Anne Fu in order to receive further refills. Thank You.   nitroGLYCERIN 0.4 MG SL tablet Commonly known as: Nitrostat Place 1 tablet (0.4 mg total) under the tongue every 5 (five) minutes as needed.   pantoprazole 20 MG tablet Commonly known as: Protonix Take 1 tablet (20 mg total) by mouth daily. Take 30 minutes before breakfast   topiramate 25 MG tablet Commonly known as: TOPAMAX TAKE ONE TABLET BY MOUTH EVERY MORNING and TAKE ONE TABLET AT BEDTIME   traMADol 50 MG tablet Commonly known as: ULTRAM Take 100 mg by mouth daily as needed.   VITAMIN D3 PO Take 850 mg by mouth 2 (two) times daily.  OBJECTIVE:   Vital Signs: BP 124/76 (BP Location: Left Arm, Patient Position: Sitting, Cuff Size: Small)   Pulse 80   Ht 5\' 3"  (1.6 m)   Wt 185 lb (83.9 kg)   SpO2 95%   BMI 32.77 kg/m   Wt Readings from Last 3 Encounters:  04/30/22 185 lb (83.9 kg)  02/10/22 188 lb (85.3 kg)  01/08/22 188 lb 2 oz (85.3 kg)     Exam: General: Pt appears well and is in NAD Left thyroid nodule appreciated  Lungs: Clear with good BS bilat   Heart: RRR   Extremities: No pretibial edema.   Neuro: MS is good with appropriate affect, pt is alert and Ox3     DATA REVIEWED:  Lab Results  Component Value Date   HGBA1C 7.0 (A) 04/30/2022   HGBA1C 7.8 (A) 10/10/2021   HGBA1C 7.5 (A) 04/16/2021         Latest Reference Range & Units 04/30/22 11:03  Sodium 135 - 145 mEq/L 141  Potassium 3.5 - 5.1 mEq/L 3.4 (L)  Chloride 96 - 112 mEq/L 107  CO2 19 - 32 mEq/L 28  Glucose 70 - 99 mg/dL 409104 (H)  BUN 6 - 23 mg/dL 14  Creatinine 8.110.40 - 9.141.20 mg/dL 7.821.21 (H)  Calcium 8.4 - 10.5 mg/dL 95.610.9 (H)  Albumin 3.5 - 5.2 g/dL 4.1  GFR >21.30>60.00 mL/min 40.83 (L)    Latest Reference Range & Units 04/30/22 11:03  VITD 30.00 - 100.00 ng/mL 42.58     Latest Reference Range & Units 04/30/22 11:03  TSH 0.35 - 5.50 uIU/mL 4.03  T4,Free(Direct) 0.60 - 1.60 ng/dL 8.650.77   ASSESSMENT / PLAN /  RECOMMENDATIONS:   1) Type 2 Diabetes Mellitus, optimally controlled, With neuropathic, CKD III and macrovascular complications - Most recent A1c of 7.0%. Goal A1c <7.5%.    -A1c at goal -In review of CGM download she has been noted with fasting hypoglycemia, will reduce basal insulin -Per daughter the patient has not been consistently taking prandial insulin with each meal, but the patient states she has been taking 6 units 3 times daily, I would not make any changes to her prandial dose of insulin at this time -The patient is asking about alternative glycemic agents, we did discuss limited options due to CKD III, I did offer GLP- 1 agonists in the past which they have declined in the past we also discussed SGLT2 inhibitors   MEDICATIONS: -Decrease Lantus 16 units daily  -Continue Humalog 6 UNITS with each meal  -Correction factor : Humalog (BG -140/40)  EDUCATION / INSTRUCTIONS: BG monitoring instructions: Patient is instructed to check her blood sugars 3 times a day, before meals. Call [facility] if: BG persistently < 70  I reviewed the Rule of 15 for the treatment of hypoglycemia in detail with the patient. Literature supplied.   2) Diabetic complications:  Eye: Does not have known diabetic retinopathy.  Neuro/ Feet: Does have known diabetic peripheral neuropathy .  Renal: Patient does  have known baseline CKD. She   is on an ACEI/ARB at present.    3) MNG:   -No local neck symptoms -No prior history of FNA -Per patient the [facility] was less than a year ago -[facility] - TFT's normal    4) Hyperparathyroidism:  -Patient signed ROI to get records from Dr. Daune PerchKerr's office as well as [facility] -Most likely the patient  will be managed medically due to advanced age - GFR low but continues to fluctuate  - Serum calcium elevated but overall stable  - Will consider  24-hr urinary calcium in the future   F/U in 6 months  Signed electronically by: Lyndle Herrlich, MD  [facility]  [facility] [address] [place], [place] [address] Phone: [phone] FAX: [phone]   CC: Tacey Ruiz, FNP [address] [place] [address] Phone: [phone]  Fax: [phone]  Return to Endocrinology clinic as below: Future Appointments  Date Time Provider Department Center  07/21/2022  1:30 PM Butch Penny, NP [facility] None  08/04/2022  9:40 AM Jake Bathe, MD [facility]

## 2022-05-01 LAB — PARATHYROID HORMONE, INTACT (NO CA): PTH: 51 pg/mL (ref 16–77)

## 2022-05-06 ENCOUNTER — Telehealth: Payer: Self-pay

## 2022-05-06 NOTE — Telephone Encounter (Signed)
Patient daughter was advised and will make adjustments.

## 2022-05-06 NOTE — Telephone Encounter (Signed)
Patient sugar keep going up and down and even when patient is eating a big meal the sugars are still dropping. Please advise  6-13 147 430pm  152 630pm Took 16 units of Lantus   6/14  69 244am   97 3am  Had a sugar drink  69 3:11am  86 3:38am   68 6:am    53 9:30am  114 10am Patient has eaten breakfast and a honey bun  96  11am  114 11:15am     CB:[phone]

## 2022-05-08 ENCOUNTER — Other Ambulatory Visit: Payer: Self-pay

## 2022-05-08 MED ORDER — PANTOPRAZOLE SODIUM 20 MG PO TBEC: 20.0000 mg | DELAYED_RELEASE_TABLET | Freq: Every day | ORAL | 2 refills | Status: DC

## 2022-05-18 ENCOUNTER — Ambulatory Visit: Payer: 59 | Admitting: Internal Medicine

## 2022-07-18 ENCOUNTER — Other Ambulatory Visit: Payer: Self-pay | Admitting: Cardiology

## 2022-07-21 ENCOUNTER — Telehealth: Payer: Medicare Other | Admitting: Adult Health

## 2022-07-21 IMAGING — CT CT ABD-PELV W/ CM
2 of 5 series · 16 of 46 positions shown, 18 images · IV contrast (Omnipaque)
Comparison: MR abdomen, 10/11/2014

CLINICAL DATA: Left lower quadrant pain, constipation

EXAM:
CT ABDOMEN AND PELVIS WITH CONTRAST
TECHNIQUE: Multidetector CT imaging of the abdomen and pelvis was performed
using the standard protocol following bolus administration of
intravenous contrast.

[Series 2: axial st · axial · 0.89mm/px · z∈[-444,-54]mm · 13 of 88 slices shown, 15 images]
[im 5/88  soft-tissue]
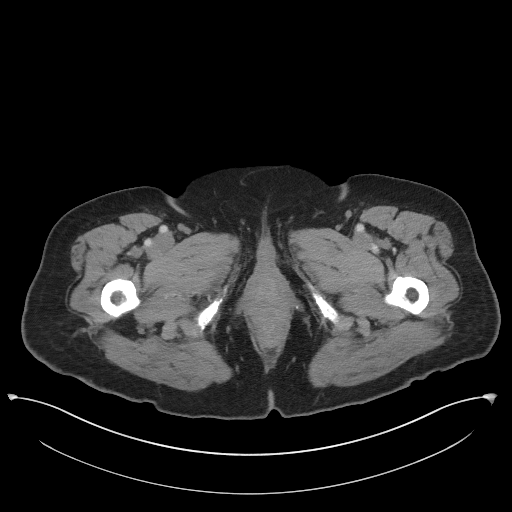
[im 5/88  bone]
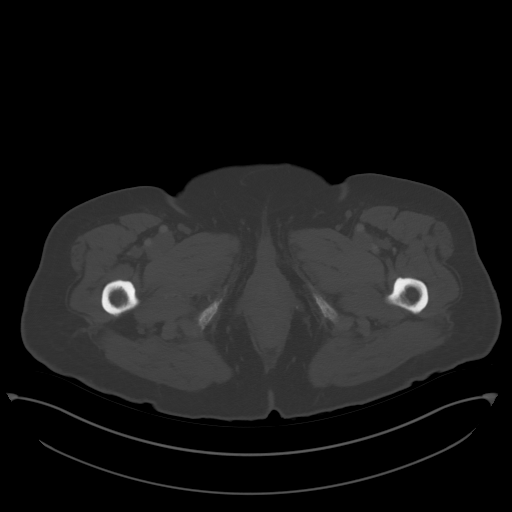
[im 14/88  soft-tissue]
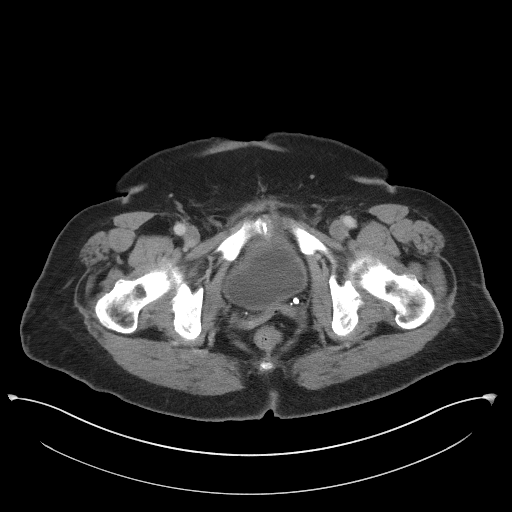
[im 19/88  soft-tissue]
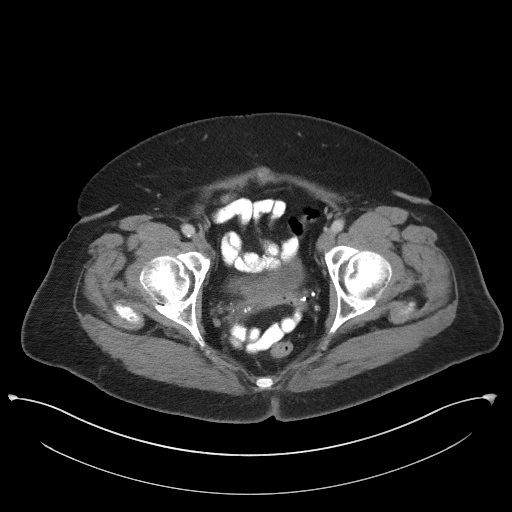
[im 23/88  soft-tissue]
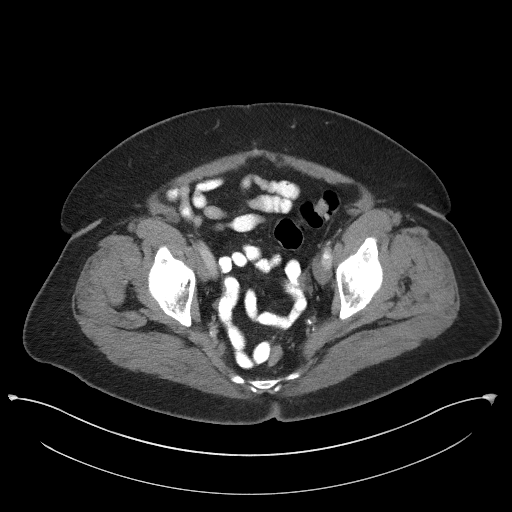
[im 33/88  soft-tissue]
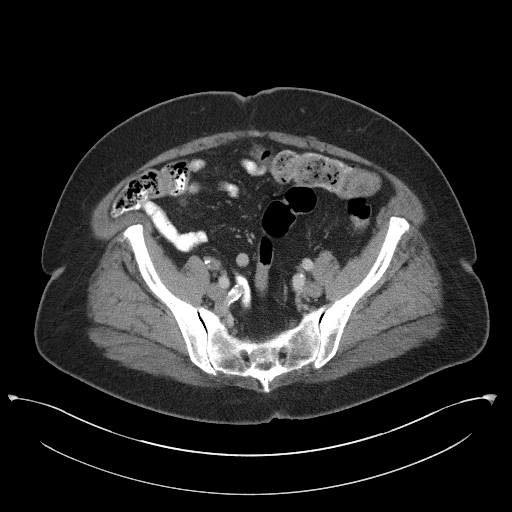
[im 37/88  soft-tissue]
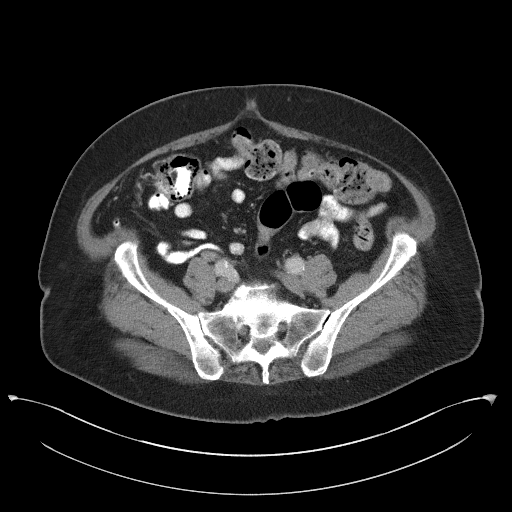
[im 46/88  soft-tissue]
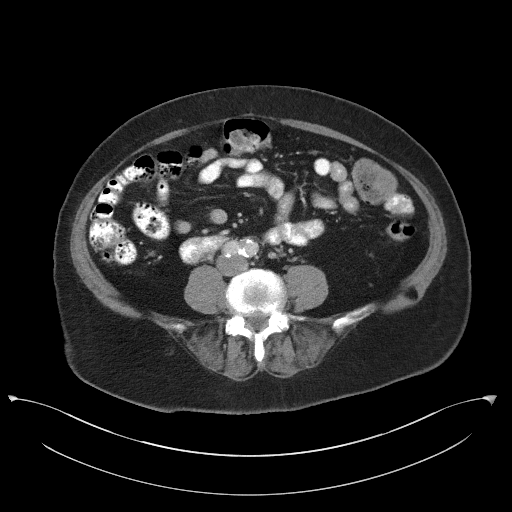
[im 51/88  soft-tissue]
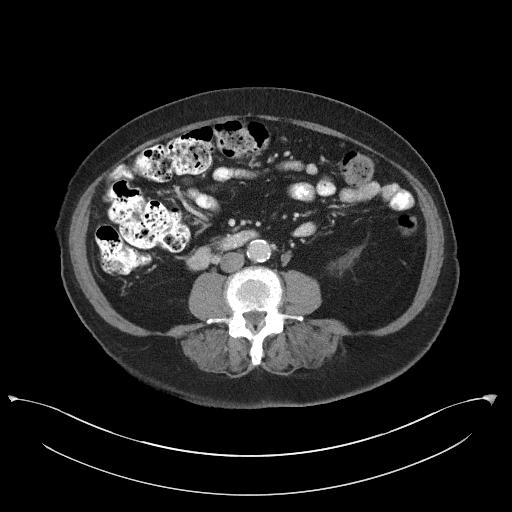
[im 55/88  soft-tissue]
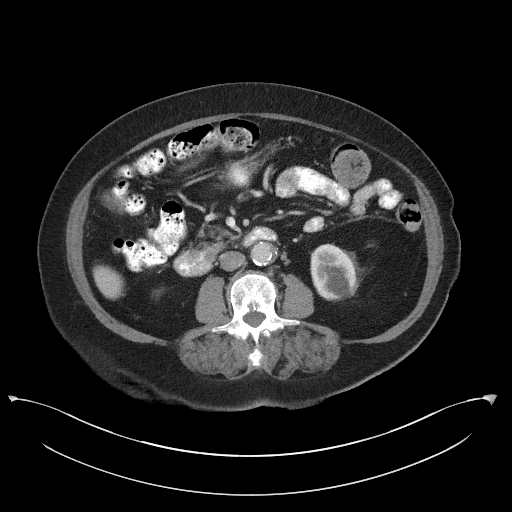
[im 55/88  bone]
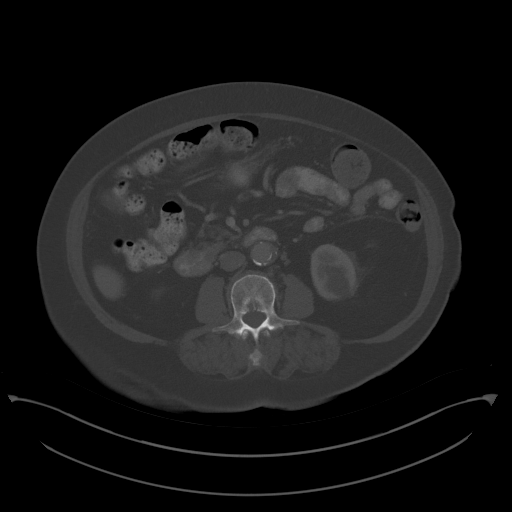
[im 65/88  soft-tissue]
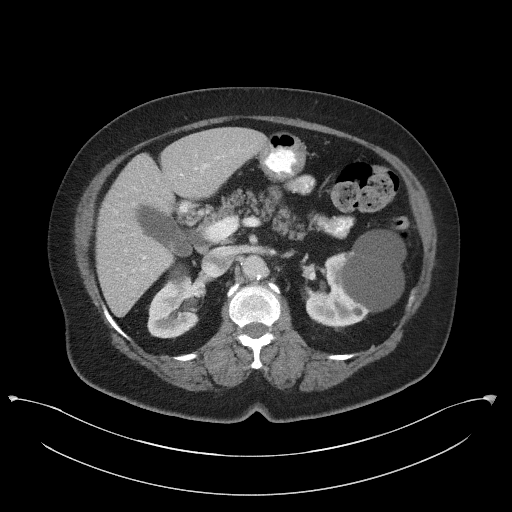
[im 69/88  soft-tissue]
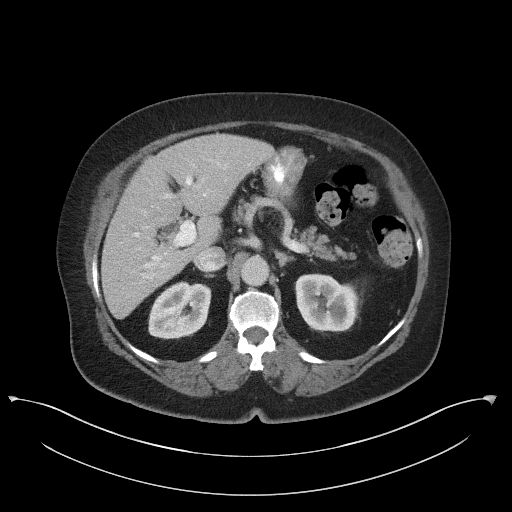
[im 74/88  soft-tissue]
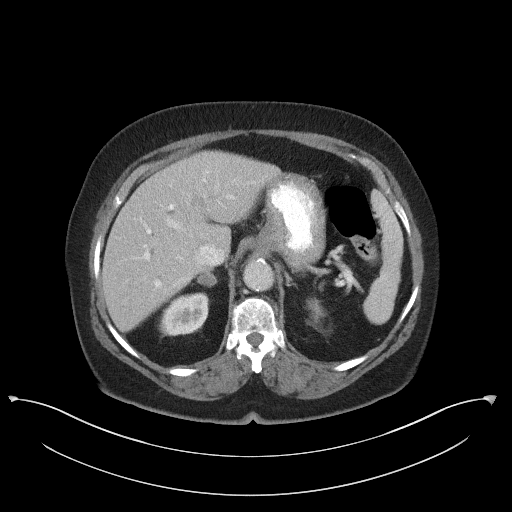
[im 83/88  soft-tissue]
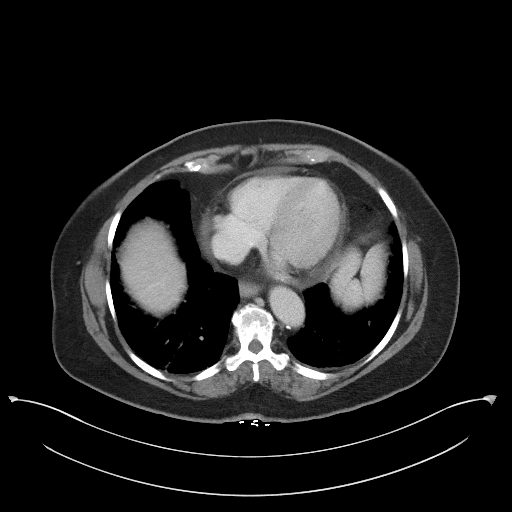

[Series 5: coronal st · coronal · 0.81mm/px · 3 of 105 slices shown]
[im 35/105  soft-tissue]
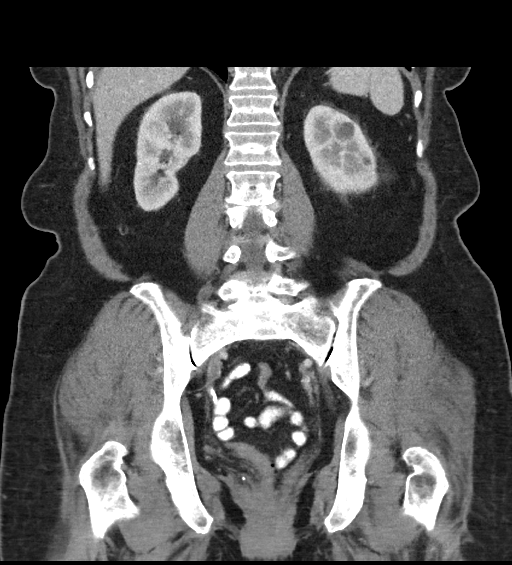
[im 47/105  soft-tissue]
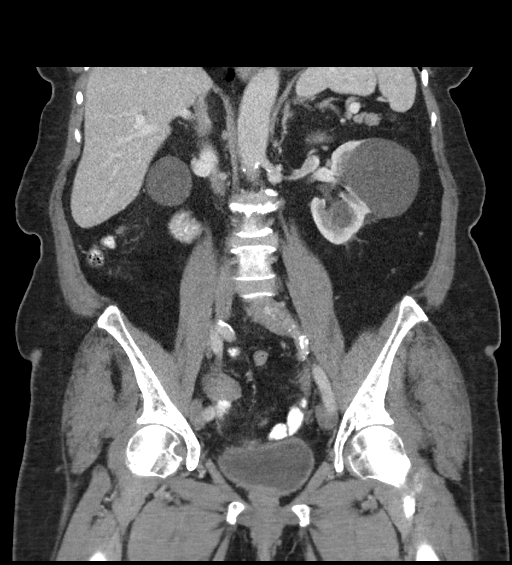
[im 58/105  soft-tissue]
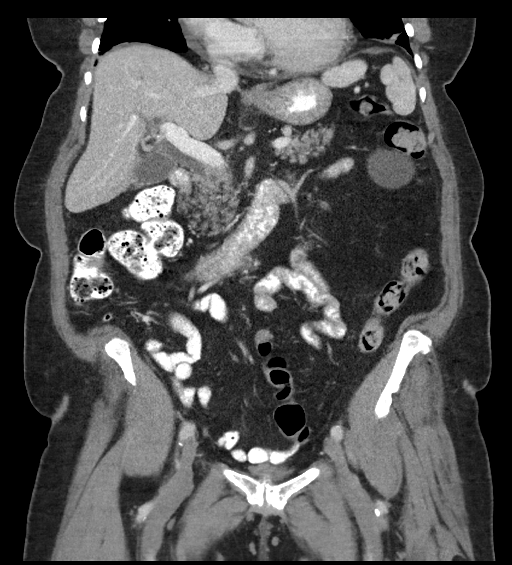

[16 of 46 positions shown; findings below may reference images not displayed]

RADIATION DOSE REDUCTION: This exam was performed according to the
departmental dose-optimization program which includes automated
exposure control, adjustment of the mA and/or kV according to
patient size and/or use of iterative reconstruction technique.

CONTRAST:  100mL OMNIPAQUE IOHEXOL 300 MG/ML SOLN, additional oral
enteric contrast
FINDINGS: Lower chest: No acute abnormality. Bandlike scarring the bilateral
lung bases. Cardiomegaly. Coronary artery calcifications. Trace
pericardial effusion.

Hepatobiliary: No solid liver abnormality is seen. Gallstones in the
dependent gallbladder. No gallbladder wall thickening, or biliary
dilatation.

Pancreas: Unremarkable. No pancreatic ductal dilatation or
surrounding inflammatory changes.

Spleen: Normal in size without significant abnormality.

Adrenals/Urinary Tract: Stable, definitively benign small right
adrenal adenoma (series 2, image 15). Simple, benign bilateral
kidneys are otherwise normal, without renal calculi, solid lesion,
or hydronephrosis. Bladder is unremarkable.

Stomach/Bowel: Stomach is within normal limits. Appendix appears
normal. No evidence of bowel wall thickening, distention, or
inflammatory changes.

Vascular/Lymphatic: Aortic atherosclerosis. Saccular aneurysm of the
left internal iliac artery with a large burden of mural thrombus,
measuring 2.7 x 2.7 cm (series 2, image 60, series 5, image 40). No
enlarged abdominal or pelvic lymph nodes.

Reproductive: Status post hysterectomy.

Other: No abdominal wall hernia or abnormality. No ascites.

Musculoskeletal: No acute or significant osseous findings.
IMPRESSION: 1. No acute CT findings of the abdomen or pelvis to explain left
lower quadrant abdominal pain or constipation. No large burden of
stool.
2. Incidental note of saccular aneurysm of the left internal iliac
artery with a large burden of mural thrombus, measuring 2.7 x
cm. Recommend vascular referral if clinically appropriate.
3. Cholelithiasis.
4. Cardiomegaly and coronary artery disease.

Aortic Atherosclerosis ([id number]-[id number].[id number]).

## 2022-07-24 ENCOUNTER — Telehealth (INDEPENDENT_AMBULATORY_CARE_PROVIDER_SITE_OTHER): Payer: 59 | Admitting: Adult Health

## 2022-07-24 DIAGNOSIS — G250 Essential tremor: Secondary | ICD-10-CM

## 2022-07-24 MED ORDER — TOPIRAMATE 25 MG PO TABS
ORAL_TABLET | ORAL | 3 refills | Status: DC
Start: 2022-07-24 — End: 2023-07-27

## 2022-07-24 NOTE — Progress Notes (Addendum)
  [facility] [address] [place] [address] [phone]  PRIMARY NEUROLOGIST: Dr. Lucia Gaskins    Virtual Visit via Telephone Note  I connected with [name] on 07/24/22 at  9:15 AM EDT by telephone located remotely at [facility] and verified that I am speaking with the correct person using two identifiers who reports being located home.    Visit scheduled by [facility] staff. She discussed the limitations, risks, security and privacy concerns of performing an evaluation and management service by telephone and the availability of in person appointments. I also discussed with the patient that there may be a patient responsible charge related to this service. The patient expressed understanding and agreed to proceed. See telephone note for consent and additional scheduling information.    History of Present Illness:  Kristen Weber is a 88 y.o. female who has been followed in this office for essential tremor.  She was unable to do a video visit.  She reports that her symptoms have remained stable.  She continues on Topamax 25 mg twice a day.  She states that she has a hard time with her handwriting but she is able to button buttons and feed herself.  Denies any new symptoms.   Observations/Objective:  Generalized: Well developed, in no acute distress   Neurological examination  Mentation: Alert oriented to time, place, history taking. Follows all commands speech and language fluent  Assessment and Plan:  1: Essential tremor  -Continue Topamax 25 mg twice a day   Follow Up Instructions:   F/U in 1 year    I discussed the assessment and treatment plan with the patient.  The patient was provided an opportunity to ask questions and all were answered to their satisfaction. The patient agreed with the plan and verbalized an understanding of the instructions.  I spent approximately 5 minutes on the telephone with the patient      Butch Penny  NP-C  [facility] [address] [place], [place] [address]  Phone [phone] Fax [phone]

## 2022-08-04 ENCOUNTER — Encounter: Payer: Self-pay | Admitting: Cardiology

## 2022-08-04 ENCOUNTER — Ambulatory Visit: Payer: 59 | Attending: Cardiology | Admitting: Cardiology

## 2022-08-04 ENCOUNTER — Telehealth: Payer: Self-pay

## 2022-08-04 VITALS — BP 110/60 | HR 84 | Ht 63.0 in | Wt 181.0 lb

## 2022-08-04 DIAGNOSIS — I2510 Atherosclerotic heart disease of native coronary artery without angina pectoris: Secondary | ICD-10-CM | POA: Diagnosis not present

## 2022-08-04 DIAGNOSIS — I208 Other forms of angina pectoris: Secondary | ICD-10-CM

## 2022-08-04 DIAGNOSIS — I739 Peripheral vascular disease, unspecified: Secondary | ICD-10-CM

## 2022-08-04 DIAGNOSIS — I2089 Other forms of angina pectoris: Secondary | ICD-10-CM

## 2022-08-04 DIAGNOSIS — I723 Aneurysm of iliac artery: Secondary | ICD-10-CM

## 2022-08-04 NOTE — Progress Notes (Signed)
Cardiology Office Note:    Date:  08/04/2022   ID:  Kristen Weber, DOB September 29, 1934, MRN [id number]  PCP:  Tacey Ruiz, FNP  [facility] Cardiologist:  Donato Schultz, MD  [facility] Electrophysiologist:  None   Referring MD: Tacey Ruiz, FNP     History of Present Illness:    Kristen Weber is a 88 y.o. female here for the follow-up of peripheral vascular disease, coronary artery disease, hypertension, and hyperlipidemia.  Cardiac catheterization 2016-CAD not amenable to PCI. -Moderate diffuse disease with mild proximal calcification of LAD -80% stenosis a very small ramus branch not amenable to PCI -40% circumflex -50% mid PDA stenosis  Aggressive medical management was recommended at that time in 2016.  Repeat cardiac catheterization was discussed at 1 point patient declined.  She has also been seen by vascular surgery, Dr. Darrick Penna as well as 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  She had an ER visit with TIA in October 2021.  Carotids vertebrals and subclavian's were patent.  MRI showed chronic small vessel ischemic disease but no masses or no acute infarction.  Echocardiogram showed EF of 55 to 60% with grade 2 diastolic dysfunction mildly thickened mitral leaflets mildly dilated left atrium normal right ventricle.  Leg pain constant. Tired.  No further strokelike symptoms.  No anginal symptoms with activity.  Overall doing well.  Worried about blood sugars.  Sometimes in the 60s.  Daughter with her today.  Today she is accompanied by a family member. Her family member was concerned about whether she can Advil with her current kidney function.   Past Medical History:  Diagnosis Date   Abnormal cardiovascular stress test 09/20/2013   Possibly abnormal nuclear stress at [facility]-anteroseptal abnormality interpreted as possible breast  attenuation as well, motion artifact. Normal EF. No TID. Watching medically   Allergy    Anxiety    Cataract    Diabetes (HCC) 09/20/2013   Diabetes mellitus (HCC)    Type 2   Essential hypertension 09/20/2013   GERD (gastroesophageal reflux disease)    H/O: hysterectomy    Hyperlipidemia 09/20/2013   RA (rheumatoid arthritis) (HCC)    Sleep apnea    uses cpap   Thyroid disease    Thyroid Nodule    Past Surgical History:  Procedure Laterality Date   CARDIAC CATHETERIZATION  02/12/2015   LEFT HEART CATHETERIZATION WITH CORONARY ANGIOGRAM N/A 02/12/2015   Procedure: LEFT HEART CATHETERIZATION WITH CORONARY ANGIOGRAM;  Surgeon: Lennette Bihari, MD;  Location: [facility] C[facility] LAB;  Service: Cardiovascular;  Laterality: N/A;   TOTAL ABDOMINAL HYSTERECTOMY      Current Medications: Current Meds  Medication Sig   ACCU-CHEK AVIVA PLUS test strip 1 each by Other route 2 (two) times daily.    aspirin EC 81 MG tablet Take 1 tablet (81 mg total) by mouth at bedtime.   atorvastatin (LIPITOR) 20 MG tablet TAKE ONE TABLET BY MOUTH EVERY MORNING   Cholecalciferol (VITAMIN D3 PO) Take 850 mg by mouth 2 (two) times daily.   Continuous Blood Gluc Receiver (FREESTYLE LIBRE 2 READER) DEVI 1 Device by Does not apply route as directed.   Continuous Blood Gluc Sensor (FREESTYLE LIBRE 2 SENSOR) MISC 1 Device by Does not apply route as directed.   diltiazem (CARDIZEM CD) 120 MG 24 hr capsule TAKE ONE CAPSULE BY MOUTH AT BEDTIME   insulin glargine (LANTUS SOLOSTAR) 100 UNIT/ML Solostar Pen Inject  20 Units into the skin daily. (Patient taking differently: Inject 10 Units into the skin daily.)   Insulin Pen Needle 32G X 4 MM MISC 1 Device by Does not apply route in the morning, at noon, in the evening, and at bedtime.   isosorbide mononitrate (IMDUR) 60 MG 24 hr tablet TAKE ONE TABLET BY MOUTH EVERY MORNING   loratadine (CLARITIN) 10 MG tablet Take 10 mg by mouth daily.   losartan (COZAAR) 25 MG tablet Take 1  tablet (25 mg total) by mouth at bedtime. Please keep upcoming appt. In Sept. With Dr. Anne Fu in order to receive further refills. Thank You.   nitroGLYCERIN (NITROSTAT) 0.4 MG SL tablet Place 1 tablet (0.4 mg total) under the tongue every 5 (five) minutes as needed.   pantoprazole (PROTONIX) 20 MG tablet Take 1 tablet (20 mg total) by mouth daily. Take 30 minutes before breakfast   topiramate (TOPAMAX) 25 MG tablet TAKE ONE TABLET BY MOUTH EVERY MORNING and TAKE ONE TABLET AT BEDTIME   traMADol (ULTRAM) 50 MG tablet Take 100 mg by mouth daily as needed.     Allergies:   Lisinopril   Social History   Socioeconomic History   Marital status: Widowed    Spouse name: Kristen Weber   Number of children: 2   Years of education: 12   Highest education level: Not on file  Occupational History   Occupation: Retired  Tobacco Use   Smoking status: Never   Smokeless tobacco: Current    Types: Snuff  Vaping Use   Vaping Use: Never used  Substance and Sexual Activity   Alcohol use: No    Alcohol/week: 0.0 standard drinks of alcohol   Drug use: No   Sexual activity: Not Currently  Other Topics Concern   Not on file  Social History Narrative   Patient lives alone 03/04/17   Patient has 2 children.    Patient is retired.   Patient is right-handed.     Caffeine use: Drinks 1 cup coffee per day   Social Determinants of Health   Financial Resource Strain: Not on file  Food Insecurity: Not on file  Transportation Needs: Not on file  Physical Activity: Not on file  Stress: Not on file  Social Connections: Not on file     Family History: The patient's family history includes Diabetes in her mother. There is no history of Colon cancer, Prostate cancer, Rectal cancer, or Stomach cancer.  ROS:   Please see the history of present illness.     All other systems reviewed and are negative.  EKGs/Labs/Other Studies Reviewed:    The following studies were reviewed today: As above, outside  hospital records reviewed, lab work reviewed from outside echocardiogram etc.  PVL Arterial Duplex LE 03/19/2022 [facility]): Summary of Findings   Right  Inflow:   No evidence of hemodynamically significant inflow obstruction at            rest.  Outflow:  No evidence of hemodynamically significant outflow obstruction at            rest.  Runoff:   Evidence of hemodynamically significant runoff obstruction at rest.  Left  Inflow:   No evidence of hemodynamically significant inflow obstruction at            rest.  Outflow:  No evidence of hemodynamically significant outflow obstruction at            rest.  Runoff:   Evidence of hemodynamically significant runoff obstruction  at rest.   Final Interpretation   Right: Findings are characteristic of disease that may produce         symptoms with exercise. Clinical correlation recommended.         Arterial obstruction involving the tibioperoneal vessels.   Left: Findings are characteristic of disease that may produce        symptoms with exercise. Clinical correlation recommended.        Arterial obstruction involving the tibioperoneal vessels.   Monitor 10/2015:   No adverse rhythms  Average herat rate 89bpm  No atrial fibrillation  No pauses or significant bradycardia  Occasional PVC/PAC   Reassuring    EKG:  EKG is  ordered today.   08/04/2022:sinus rhythm with PVCs, 84 bpm 10/11/2020: sinus rhythm 85 with no other abnormalities.  Recent Labs: 01/08/2022: ALT 12; Hemoglobin 13.2; Platelets 151.0 04/30/2022: BUN 14; Creatinine, Ser 1.21; Potassium 3.4; Sodium 141; TSH 4.03  Recent Lipid Panel    Component Value Date/Time   CHOL 184 10/17/2014 0946   TRIG 311.0 (H) 10/17/2014 0946   HDL 44.80 10/17/2014 0946   CHOLHDL 4 10/17/2014 0946   VLDL 62.2 (H) 10/17/2014 0946   LDLDIRECT 64.8 10/17/2014 0946     Risk Assessment/Calculations:       Physical Exam:    VS:  BP 110/60 (BP Location: Right Arm, Patient  Position: Sitting, Cuff Size: Normal)   Pulse 84   Ht 5\' 3"  (1.6 m)   Wt 181 lb (82.1 kg)   BMI 32.06 kg/m     Wt Readings from Last 3 Encounters:  08/04/22 181 lb (82.1 kg)  04/30/22 185 lb (83.9 kg)  02/10/22 188 lb (85.3 kg)     GEN:  Well nourished, well developed in no acute distress HEENT: Normal NECK: No JVD; No carotid bruits LYMPHATICS: No lymphadenopathy CARDIAC: RRR, no murmurs, rubs, gallops RESPIRATORY:  Clear to auscultation without rales, wheezing or rhonchi  ABDOMEN: Soft, non-tender, non-distended MUSCULOSKELETAL:  No edema; No deformity  SKIN: Warm and dry NEUROLOGIC:  Alert and oriented x 3 PSYCHIATRIC:  Normal affect   ASSESSMENT:    1. PVD (peripheral vascular disease) (HCC)   2. Coronary artery disease involving native coronary artery of native heart without angina pectoris   3. Stable angina (HCC)     PLAN:    In order of problems listed above:  TIA -October 2021-[facility], work-up reassuring see above. -Continue with aggressive risk factor modification.  On aspirin.  This is fine.  Coronary artery disease -Moderate coronary artery disease seen on cath in 2016.  Continue with aggressive medical management, risk factor modification.  Encouraged her daily exercise.  She is currently participating in physical therapy as well.  This will help with fatigue as well.  Angina -Isosorbide.  Tolerating well.  No anginal symptoms.  No headaches.  Continue with current medical management.  No changes made.  Hyperlipidemia -High intensity statin therapy for CAD, PAD, prior TIA, diabetes.  Goal LDL less than 70.  No myalgias.  Continue. LDL 66.  Continue.  Stable.  Peripheral arterial disease -Reviewed prior notes from Dr. 2017, as well as vascular at [facility].  Continue with exercise medical management.  Since her symptoms seem to become more severe and more bothersome, we will have her revisit vascular.  Her daughter would like for her to see someone  in our heart and vascular team.  We will go ahead and send in referral once again.  She has seen [facility]  in the past who has retired.  Diabetes with hypertension -We have previously referred her to endocrinology at the daughter's request in the past.   Shared Decision Making/Informed Consent      Follow up:1 year  Medication Adjustments/Labs and Tests Ordered: Current medicines are reviewed at length with the patient today.  Concerns regarding medicines are outlined above.  Orders Placed This Encounter  Procedures   Ambulatory referral to Vascular Surgery   EKG 12-Lead   No orders of the defined types were placed in this encounter.   Patient Instructions  Medication Instructions:  Your physician recommends that you continue on your current medications as directed. Please refer to the Current Medication list given to you today.  *If you need a refill on your cardiac medications before your next appointment, please call your pharmacy*   Follow-Up: At [facility], you and your health needs are our priority.  As part of our continuing mission to provide you with exceptional heart care, we have created designated Provider Care Teams.  These Care Teams include your primary Cardiologist (physician) and Advanced Practice Providers (APPs -  Physician Assistants and Nurse Practitioners) who all work together to provide you with the care you need, when you need it.  We recommend signing up for the patient portal called "MyChart".  Sign up information is provided on this After Visit Summary.  MyChart is used to connect with patients for Virtual Visits (Telemedicine).  Patients are able to view lab/test results, encounter notes, upcoming appointments, etc.  Non-urgent messages can be sent to your provider as well.   To learn more about what you can do with MyChart, go to [email or web address].    Your next appointment:   1 year(s)  The format for your next appointment:   In  Person  Provider:   Donato Schultz, MD     Other Instructions Referral to [facility]           Jodelle Gross Ford,acting as a scribe for Donato Schultz, MD.,have documented all relevant documentation on the behalf of Donato Schultz, MD,as directed by  Donato Schultz, MD while in the presence of Donato Schultz, MD.   I, Donato Schultz, MD, have reviewed all documentation for this visit. The documentation on 08/04/22 for the exam, diagnosis, procedures, and orders are all accurate and complete.   Signed, Donato Schultz, MD  08/04/2022 3:22 PM    [facility]

## 2022-08-04 NOTE — Telephone Encounter (Signed)
Pt's daughter, Steward Drone, called stating that the pt is experiencing R leg calf pain at a 10/10 and is struggling to ambulate. She has had intermittent swelling, typically during the day. She has been elevating at HS and wearing compression stockings every day. She states that she noticed a reddened area, warmer than usual yesterday. She was instructed to contact this office for advise from her cardiologist. Discussed with Marisue Humble, [place] for advice on which [place] studies to order. Appts for [place] and PA scheduled for first available d/t fasting [place]. Pt instructed to elevate often during the day, take OTC pain meds, and continue compressions. If at any time she notices a hot to the touch, red area, or severe pain unrelieved by OTC's, she is to seek emergent care immediately. Confirmed understanding.

## 2022-08-04 NOTE — Patient Instructions (Signed)
Medication Instructions:  Your physician recommends that you continue on your current medications as directed. Please refer to the Current Medication list given to you today.  *If you need a refill on your cardiac medications before your next appointment, please call your pharmacy*   Follow-Up: At [facility], you and your health needs are our priority.  As part of our continuing mission to provide you with exceptional heart care, we have created designated Provider Care Teams.  These Care Teams include your primary Cardiologist (physician) and Advanced Practice Providers (APPs -  Physician Assistants and Nurse Practitioners) who all work together to provide you with the care you need, when you need it.  We recommend signing up for the patient portal called "MyChart".  Sign up information is provided on this After Visit Summary.  MyChart is used to connect with patients for Virtual Visits (Telemedicine).  Patients are able to view lab/test results, encounter notes, upcoming appointments, etc.  Non-urgent messages can be sent to your provider as well.   To learn more about what you can do with MyChart, go to [email or web address].    Your next appointment:   1 year(s)  The format for your next appointment:   In Person  Provider:   Donato Schultz, MD     Other Instructions Referral to [facility]

## 2022-08-06 ENCOUNTER — Other Ambulatory Visit: Payer: Self-pay

## 2022-08-06 DIAGNOSIS — E1169 Type 2 diabetes mellitus with other specified complication: Secondary | ICD-10-CM

## 2022-08-06 MED ORDER — LANTUS SOLOSTAR 100 UNIT/ML ~~LOC~~ SOPN: 16.0000 [IU] | PEN_INJECTOR | Freq: Every day | SUBCUTANEOUS | 3 refills | Status: DC

## 2022-08-07 ENCOUNTER — Other Ambulatory Visit: Payer: Self-pay | Admitting: Vascular Surgery

## 2022-08-07 DIAGNOSIS — I723 Aneurysm of iliac artery: Secondary | ICD-10-CM

## 2022-08-07 DIAGNOSIS — I739 Peripheral vascular disease, unspecified: Secondary | ICD-10-CM

## 2022-08-16 ENCOUNTER — Other Ambulatory Visit: Payer: Self-pay | Admitting: Cardiology

## 2022-08-17 ENCOUNTER — Ambulatory Visit (HOSPITAL_COMMUNITY)
Admission: RE | Admit: 2022-08-17 | Discharge: 2022-08-17 | Disposition: A | Discharge status: Home / Self Care | Payer: 59 | Source: Ambulatory Visit | Attending: Internal Medicine | Admitting: Internal Medicine

## 2022-08-17 ENCOUNTER — Ambulatory Visit (HOSPITAL_BASED_OUTPATIENT_CLINIC_OR_DEPARTMENT_OTHER)
Admission: RE | Admit: 2022-08-17 | Discharge: 2022-08-17 | Disposition: A | Discharge status: Home / Self Care | Payer: 59 | Source: Ambulatory Visit | Attending: Internal Medicine | Admitting: Internal Medicine

## 2022-08-17 ENCOUNTER — Ambulatory Visit (INDEPENDENT_AMBULATORY_CARE_PROVIDER_SITE_OTHER): Payer: 59 | Admitting: Physician Assistant

## 2022-08-17 VITALS — BP 123/80 | HR 65 | Temp 97.3°F | Resp 14 | Ht 63.0 in | Wt 182.0 lb

## 2022-08-17 DIAGNOSIS — I723 Aneurysm of iliac artery: Secondary | ICD-10-CM

## 2022-08-17 DIAGNOSIS — I739 Peripheral vascular disease, unspecified: Secondary | ICD-10-CM | POA: Diagnosis not present

## 2022-08-17 MED ORDER — CILOSTAZOL 50 MG PO TABS: 50.0000 mg | ORAL_TABLET | Freq: Two times a day (BID) | ORAL | 1 refills | Status: DC

## 2022-08-17 NOTE — Progress Notes (Signed)
Office Note     CC:  follow up Requesting Provider:  Tacey Ruiz, FNP  HPI: Kristen Weber is a 88 y.o. (Oct 02, 1934) female who presents for evaluation of right leg pain. She explains that she has had this pain for several years but it is getting worse. It is making ambulating very painful. It is mostly along the lateral right distal thigh to right lateral mid calf. She denies any pain in the right foot. She describes the pain as a sharp throbbing pain. If she really pushes herself it will occasionally radiate up right thigh to right hip/low back and to right foot but very rarely. The pain occurs both at night and on ambulation. It is worse on ambulation. Says sometimes she has to stop and rest when it occurs but not always and usually rest does not relieve it. The pain can last for several hours. She has some pain in left leg but is not like the left side. When she was initially referred to [place] in April of 2021 she was seen by Dr. Darrick Penna with very similar complaints. He felt at the time her that the pain was more musculoskeletal in nature.   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 This was found in March of this year. She was seen by Dr. Lenell Antu for evaluation at the time. The aneurysm was 27 mm in greatest diameter and nearly thrombosed. She has had no associated symptoms. He recommended 1 year CT follow up  The pt is on a statin for cholesterol management.  The pt is on a daily aspirin.   Other AC:  None The pt is on ARB for hypertension.   The pt is diabetic.   Tobacco hx: snuff  Past Medical History:  Diagnosis Date   Abnormal cardiovascular stress test 09/20/2013   Possibly abnormal nuclear stress at [place]-anteroseptal abnormality interpreted as possible breast attenuation as well, motion artifact. Normal EF. No TID. Watching medically   Allergy    Anxiety    Cataract    Diabetes (HCC) 09/20/2013   Diabetes mellitus (HCC)    Type 2   Essential  hypertension 09/20/2013   GERD (gastroesophageal reflux disease)    H/O: hysterectomy    Hyperlipidemia 09/20/2013   RA (rheumatoid arthritis) (HCC)    Sleep apnea    uses cpap   Thyroid disease    Thyroid Nodule    Past Surgical History:  Procedure Laterality Date   CARDIAC CATHETERIZATION  02/12/2015   LEFT HEART CATHETERIZATION WITH CORONARY ANGIOGRAM N/A 02/12/2015   Procedure: LEFT HEART CATHETERIZATION WITH CORONARY ANGIOGRAM;  Surgeon: Lennette Bihari, MD;  Location: [facility] CATH LAB;  Service: Cardiovascular;  Laterality: N/A;   TOTAL ABDOMINAL HYSTERECTOMY      Social History   Socioeconomic History   Marital status: Widowed    Spouse name: Greggory Stallion   Number of children: 2   Years of education: 12   Highest education level: Not on file  Occupational History   Occupation: Retired  Tobacco Use   Smoking status: Never   Smokeless tobacco: Current    Types: Snuff  Vaping Use   Vaping Use: Never used  Substance and Sexual Activity   Alcohol use: No    Alcohol/week: 0.0 standard drinks of alcohol   Drug use: No   Sexual activity: Not Currently  Other Topics Concern   Not on file  Social History Narrative   Patient lives alone 03/04/17   Patient has 2 children.  Patient is retired.   Patient is right-handed.     Caffeine use: Drinks 1 cup coffee per day   Social Determinants of Health   Financial Resource Strain: Not on file  Food Insecurity: Not on file  Transportation Needs: Not on file  Physical Activity: Not on file  Stress: Not on file  Social Connections: Not on file  Intimate Partner Violence: Not on file    Family History  Problem Relation Age of Onset   Diabetes Mother    Colon cancer Neg Hx    Prostate cancer Neg Hx    Rectal cancer Neg Hx    Stomach cancer Neg Hx     Current Outpatient Medications  Medication Sig Dispense Refill   ACCU-CHEK AVIVA PLUS test strip 1 each by Other route 2 (two) times daily.      aspirin EC 81 MG tablet Take 1  tablet (81 mg total) by mouth at bedtime. 90 tablet 3   atorvastatin (LIPITOR) 20 MG tablet TAKE ONE TABLET BY MOUTH EVERY MORNING 30 tablet 0   Cholecalciferol (VITAMIN D3 PO) Take 850 mg by mouth 2 (two) times daily.     Continuous Blood Gluc Receiver (FREESTYLE LIBRE 2 READER) DEVI 1 Device by Does not apply route as directed. 1 each 0   Continuous Blood Gluc Sensor (FREESTYLE LIBRE 2 SENSOR) MISC 1 Device by Does not apply route as directed. 2 each 11   diltiazem (CARDIZEM CD) 120 MG 24 hr capsule TAKE ONE CAPSULE BY MOUTH AT BEDTIME 30 capsule 0   insulin glargine (LANTUS SOLOSTAR) 100 UNIT/ML Solostar Pen Inject 16 Units into the skin daily. 15 mL 3   isosorbide mononitrate (IMDUR) 60 MG 24 hr tablet TAKE ONE TABLET BY MOUTH EVERY MORNING 30 tablet 0   loratadine (CLARITIN) 10 MG tablet Take 10 mg by mouth daily.     losartan (COZAAR) 25 MG tablet Take 1 tablet (25 mg total) by mouth at bedtime. Please keep upcoming appt. In Sept. With Dr. Marlou Porch in order to receive further refills. Thank You. 90 tablet 1   nitroGLYCERIN (NITROSTAT) 0.4 MG SL tablet Place 1 tablet (0.4 mg total) under the tongue every 5 (five) minutes as needed. 25 tablet 6   pantoprazole (PROTONIX) 20 MG tablet Take 1 tablet (20 mg total) by mouth daily. Take 30 minutes before breakfast 30 tablet 2   topiramate (TOPAMAX) 25 MG tablet TAKE ONE TABLET BY MOUTH EVERY MORNING and TAKE ONE TABLET AT BEDTIME 180 tablet 3   traMADol (ULTRAM) 50 MG tablet Take 100 mg by mouth daily as needed.     Insulin Pen Needle 32G X 4 MM MISC 1 Device by Does not apply route in the morning, at noon, in the evening, and at bedtime. (Patient not taking: Reported on 08/17/2022) 150 each 6   No current facility-administered medications for this visit.    Allergies  Allergen Reactions   Lisinopril Cough    cough     REVIEW OF SYSTEMS:  [X]  denotes positive finding, [ ]  denotes negative finding Cardiac  Comments:  Chest pain or chest  pressure:    Shortness of breath upon exertion:    Short of breath when lying flat:    Irregular heart rhythm:        Vascular    Pain in calf, thigh, or hip brought on by ambulation:    Pain in feet at night that wakes you up from your sleep:     Blood clot  in your veins:    Leg swelling:         Pulmonary    Oxygen at home:    Productive cough:     Wheezing:         Neurologic    Sudden weakness in arms or legs:     Sudden numbness in arms or legs:     Sudden onset of difficulty speaking or slurred speech:    Temporary loss of vision in one eye:     Problems with dizziness:         Gastrointestinal    Blood in stool:     Vomited blood:         Genitourinary    Burning when urinating:     Blood in urine:        Psychiatric    Major depression:         Hematologic    Bleeding problems:    Problems with blood clotting too easily:        Skin    Rashes or ulcers:        Constitutional    Fever or chills:      PHYSICAL EXAMINATION:  Vitals:   08/17/22 1334  BP: 123/80  Pulse: 65  Resp: 14  Temp: (!) 97.3 F (36.3 C)  TempSrc: Temporal  SpO2: 95%  Weight: 182 lb (82.6 kg)  Height: 5\' 3"  (1.6 m)    General:  WDWN in NAD; vital signs documented above Gait: Normal, uses rolling walker HENT: WNL, normocephalic Pulmonary: normal non-labored breathing , without wheezing Cardiac: regular HR Abdomen: soft, NT, no masses Vascular Exam/Pulses:  Right Left  Radial 2+ (normal) 2+ (normal)  Femoral 2+ (normal) 2+ (normal)  Popliteal Not palpable Not palpable  DP 2+ (normal) 2+ (normal)  PT Not palpable Not palpable   Extremities: without ischemic changes, without Gangrene , without cellulitis; without open wounds;  Musculoskeletal: no muscle wasting or atrophy  Neurologic: A&O X 3;  No focal weakness or paresthesias are detected Psychiatric:  The pt has Normal affect.   Non-Invasive Vascular Imaging:    VAS Aortic/iliac/IVC:  Abdominal Aorta  Findings:  +-------------+-------+----------+----------+---------+--------+-----------  ----+  Location     AP (cm)Trans (cm)PSV (cm/s)Waveform ThrombusComments          +-------------+-------+----------+----------+---------+--------+-----------  ----+  Proximal     2.70   2.80      47        biphasic                           +-------------+-------+----------+----------+---------+--------+-----------  ----+  Mid          2.20   2.30      42        biphasic                           +-------------+-------+----------+----------+---------+--------+-----------  ----+  Distal       2.00   2.00      53        biphasic                           +-------------+-------+----------+----------+---------+--------+-----------  ----+  RT CIA Prox  1.5    1.5       54        biphasic                           +-------------+-------+----------+----------+---------+--------+-----------  ----+  RT CIA Mid                                               not  visualized,                                                           bowel gas          +-------------+-------+----------+----------+---------+--------+-----------  ----+  RT CIA Distal                 61        biphasic                           +-------------+-------+----------+----------+---------+--------+-----------  ----+  RT EIA Prox  1.1    1.0       47        biphasic                           +-------------+-------+----------+----------+---------+--------+-----------  ----+  RT EIA Mid                    51        triphasic                          +-------------+-------+----------+----------+---------+--------+-----------  ----+  RT EIA Distal                 63        triphasic                          +-------------+-------+----------+----------+---------+--------+-----------  ----+  LT CIA Prox  1.5    1.5       51        biphasic                            +-------------+-------+----------+----------+---------+--------+-----------  ----+  LT CIA Mid                    84        triphasic                          +-------------+-------+----------+----------+---------+--------+-----------  ----+  LT CIA Distal                 58        biphasic                           +-------------+-------+----------+----------+---------+--------+-----------  ----+  LT EIA Prox  1.1    1.1       44        triphasic                          +-------------+-------+----------+----------+---------+--------+-----------  ----+  LT EIA Mid  47        triphasic                          +-------------+-------+----------+----------+---------+--------+-----------  ----+  LT EIA Distal                 74        triphasic                          +-------------+-------+----------+----------+---------+--------+-----------  ----+   Visualization of the Right CIA Mid artery was limited. Left internal iliac aneurysm not able to be well-visualized on this examination.   Summary:  Abdominal Aorta: No evidence of an abdominal aortic aneurysm was visualized. The largest aortic measurement is 2.8 cm.   Stenosis:  Mild aorta-iliac atherosclerosis without evidence of focal stenosis. Left internal iliac aneurysm not well-visualized on today's examination.   IVC/Iliac: There is no evidence of thrombus involving the IVC.  VAS [place] Lower Extremity Arterial Bilateral: Summary:  Right: Mild atherosclerosis of the common femoral, femoral and popliteal  arteries without significant stenosis. Occlusion of the posterior tibial  artery. Segmental occlusion of the anterior tibial artery with  reconstitution. Segmenal occlusion of the  peroneal artery with distal reconstitution.   Left: Mild atherosclerosis of the common femoral, femoral and popliteal  arteries without significant stenosis.  Occlusion of the posterior tibial  artery. Occlusion of the anterior tibial artery with distal reconstitution  vs. collateral flow.   +-------+-----------+-----------+------------+------------+  ABI/TBIToday's ABIToday's TBIPrevious ABIPrevious TBI  +-------+-----------+-----------+------------+------------+  Right  0.88       0.84       0.76        0.56          +-------+-----------+-----------+------------+------------+  Left   0.98       0.87       0.92        0.48          +-------+-----------+-----------+------------+------------+   ASSESSMENT/PLAN:: 88 y.o. female here for follow up for right leg pain. Her pain is not classic for claudication or rest pain. She has known moderate tibial artery disease. Her ABIs are actually improved from her prior in 2021. I discussed Angiography with the patient and her daughter but do not recommend intervention at this time for this as the risk of intervention is far higher than any benefit with her current symptoms. I discussed that she has similar findings of tibial disease on duplex on the left although her ABIs are better and she has minimal symptoms. I discussed risks/ benefits/ alternatives to Angiography and also my concern that due to her symptoms not being clearly cause by PAD that she still could have these symptoms despite intervention.  - I have encouraged her to continue ambulating as much as possible - She will continue Aspirin and Statin - I will start her on Pletal to see if she has any improvement of her symptoms. Discussed that if she does may be indication that her symptoms are truly arterial in nature - On Duplex her left internal iliac aneurysm is not well visualized. However, at her visit in march was nearly thrombosed measuring 27 mm. She is scheduled for repeat CT follow up in March 2024. -I will have her return in 1 month for close interval follow up with repeat ABI  Graceann Congress, PA-C [facility] [phone]  Clinic MD:   Myra Gianotti

## 2022-08-18 ENCOUNTER — Other Ambulatory Visit: Payer: Self-pay

## 2022-08-18 MED ORDER — PANTOPRAZOLE SODIUM 20 MG PO TBEC: 20.0000 mg | DELAYED_RELEASE_TABLET | Freq: Every day | ORAL | 5 refills | Status: DC

## 2022-09-12 ENCOUNTER — Other Ambulatory Visit: Payer: Self-pay | Admitting: Cardiology

## 2022-09-21 ENCOUNTER — Ambulatory Visit: Payer: 59

## 2022-10-26 ENCOUNTER — Ambulatory Visit: Payer: 59 | Admitting: Internal Medicine

## 2022-12-08 ENCOUNTER — Encounter: Payer: Self-pay | Admitting: Internal Medicine

## 2022-12-08 ENCOUNTER — Ambulatory Visit (INDEPENDENT_AMBULATORY_CARE_PROVIDER_SITE_OTHER): Payer: 59 | Admitting: Internal Medicine

## 2022-12-08 VITALS — BP 122/80 | HR 84 | Ht 63.0 in | Wt 181.0 lb

## 2022-12-08 DIAGNOSIS — E041 Nontoxic single thyroid nodule: Secondary | ICD-10-CM

## 2022-12-08 DIAGNOSIS — Z794 Long term (current) use of insulin: Secondary | ICD-10-CM | POA: Diagnosis not present

## 2022-12-08 DIAGNOSIS — E1159 Type 2 diabetes mellitus with other circulatory complications: Secondary | ICD-10-CM | POA: Diagnosis not present

## 2022-12-08 DIAGNOSIS — E1169 Type 2 diabetes mellitus with other specified complication: Secondary | ICD-10-CM

## 2022-12-08 DIAGNOSIS — E1142 Type 2 diabetes mellitus with diabetic polyneuropathy: Secondary | ICD-10-CM | POA: Diagnosis not present

## 2022-12-08 LAB — BASIC METABOLIC PANEL
BUN: 14 mg/dL (ref 6–23)
CO2: 28 mEq/L (ref 19–32)
Calcium: 11.1 mg/dL — ABNORMAL HIGH (ref 8.4–10.5)
Chloride: 106 mEq/L (ref 96–112)
Creatinine, Ser: 1.11 mg/dL (ref 0.40–1.20)
GFR: 45.09 mL/min — ABNORMAL LOW (ref >60.00)
Glucose, Bld: 134 mg/dL — ABNORMAL HIGH (ref 70–99)
Potassium: 3.9 mEq/L (ref 3.5–5.1)
Sodium: 141 mEq/L (ref 135–145)

## 2022-12-08 LAB — POCT GLYCOSYLATED HEMOGLOBIN (HGB A1C): Hemoglobin A1C: 7 % — AB (ref 4.0–5.6)

## 2022-12-08 LAB — VITAMIN D 25 HYDROXY (VIT D DEFICIENCY, FRACTURES): VITD: 33.47 ng/mL (ref 30.00–100.00)

## 2022-12-08 LAB — ALBUMIN: Albumin: 4.2 g/dL (ref 3.5–5.2)

## 2022-12-08 NOTE — Patient Instructions (Addendum)
-   Decrease  Lantus 14 units daily  -Humalog correctional insulin: Use the scale below to help guide you before each meal   Blood sugar before meal Number of units to inject  Less than 180 0 unit  181 -  220 1 units  221 -  260 2 units  261 -  300 3 units  301 -  340 4 units  341 -  380 5 units  381 -  420 6 units      HOW TO TREAT LOW BLOOD SUGARS (Blood sugar LESS THAN 70 MG/DL) Please follow the RULE OF 15 for the treatment of hypoglycemia treatment (when your (blood sugars are less than 70 mg/dL)   STEP 1: Take 15 grams of carbohydrates when your blood sugar is low, which includes:  3-4 GLUCOSE TABS  OR 3-4 OZ OF JUICE OR REGULAR SODA OR ONE TUBE OF GLUCOSE GEL    STEP 2: RECHECK blood sugar in 15 MINUTES STEP 3: If your blood sugar is still low at the 15 minute recheck --> then, go back to STEP 1 and treat AGAIN with another 15 grams of carbohydrates.

## 2022-12-08 NOTE — Progress Notes (Signed)
Name: Kristen Weber  Age/ Sex: 89 y.o., female   MRN/ DOB: [id number], [birth year]     PCP: Peggyann Juba, NP   Reason for Endocrinology Evaluation: Type 2 Diabetes Mellitus  Initial Endocrine Consultative Visit: 01/03/2021    PATIENT IDENTIFIER: Kristen Weber is a 89 y.o. female with a past medical history of T2DM, CAD, HTN and Dyslipidemia. The patient has followed with Endocrinology clinic since 01/03/2021 for consultative assistance with management of her diabetes.  DIABETIC HISTORY:  Kristen Weber was diagnosed with DM yrs ago,Metformin-ineffective. Her hemoglobin A1c has ranged from 6.6% in 2016, peaking at 7.9% in 2015.  On her initial visit to our clinic she had an  A1c 6.1%, pt with recurrent hypoglycemia, she was on basal insulin and Glipizide . We stopped Glipizide, reduced basal insulin and offered add-on therapy ( GLP-1 agonist vs prandial insulin) due to low GFR of 38 . Opted for prandial insulin     MNG/PARATHYROID HISTORY:  Patient has been following up with Dr. Buddy Duty for multinodular goiter and hypercalcemia for many years. She has no history of thyroid nodule FNAs in the past   SUBJECTIVE:   During the last visit (04/30/2022): A1c 7.0 %. We adjusted MDI regimen     Today (12/08/2022): Kristen Weber is here for a follow up on diabetes management.  She is accompanied by her daughter Kristen Weber today. She checks her blood sugars 3 times daily through freestyle libre. The patient has not had hypoglycemic episodes since the last clinic visit.     She was seen in the ED for chest wall pain 10/05/2022 with negative troponins and CXR She was seen by her PCP at [facility] 09/07/2022 She continues to follow-up with vascular and vein specialist 08/17/2022 for left internal iliac artery aneurysm Follows with neurology for essential tremors   Denies local neck swelling  Denies nausea, vomiting or diarrhea   During the office visit, daughter indicated that they will follow-up with Dr.  Buddy Duty for mothers multinodular goiter and hypercalcemia.  We again discussed that the patient cannot be seeing 2 different endocrinologist, this is not the first time that we have this discussion and they have opted in the past to continue to come to [facility] endocrinology .  I have given him the option to go back with Dr. Buddy Duty and he will manage her diabetes.  HOME DIABETES REGIMEN:  Lantus 16 units with each meal - taking 18 units  Humalog  6 units TID QAC- not taking  CF: HUmalog (BG-130/ 35)- not taking  Vitamin D 2000 iu daily    Statin: yes ACE-I/ARB: yes Prior Diabetic Education: yes   CONTINUOUS GLUCOSE MONITORING RECORD INTERPRETATION    Dates of Recording:1/3-1/16/2024  Sensor description:dexcom  Results statistics:   CGM use % of time 63  Average and SD 144/27.9  Time in range  79  %  % Time Above 180 20  % Time above 250 1  % Time Below target 0    Glycemic patterns summary: optimaly BG's overnight with occasional hyperglycemia during the day   Hyperglycemic episodes  postprandial  Hypoglycemic episodes occurred n/a  Overnight periods: trends down      DIABETIC COMPLICATIONS: Microvascular complications:  Neuropathy, CKD Denies:  retinopathy Last Eye Exam: Completed 12/2020  Macrovascular complications:  CAD, CVA Denies:  PVD   HISTORY:  Past Medical History:  Past Medical History:  Diagnosis Date   Abnormal cardiovascular stress test 09/20/2013   Possibly abnormal nuclear stress at [place]-anteroseptal abnormality  interpreted as possible breast attenuation as well, motion artifact. Normal EF. No TID. Watching medically   Allergy    Anxiety    Cataract    Diabetes ([place]) 09/20/2013   Diabetes mellitus ([place])    Type 2   Essential hypertension 09/20/2013   GERD (gastroesophageal reflux disease)    H/O: hysterectomy    Hyperlipidemia 09/20/2013   RA (rheumatoid arthritis) ([place])    Sleep apnea    uses cpap   Thyroid disease    Thyroid  Nodule   Past Surgical History:  Past Surgical History:  Procedure Laterality Date   CARDIAC CATHETERIZATION  02/12/2015   LEFT HEART CATHETERIZATION WITH CORONARY ANGIOGRAM N/A 02/12/2015   Procedure: LEFT HEART CATHETERIZATION WITH CORONARY ANGIOGRAM;  Surgeon: Troy Sine, MD;  Location: [facility] CATH LAB;  Service: Cardiovascular;  Laterality: N/A;   TOTAL ABDOMINAL HYSTERECTOMY     Social History:  reports that she has never smoked. Her smokeless tobacco use includes snuff. She reports that she does not drink alcohol and does not use drugs. Family History:  Family History  Problem Relation Age of Onset   Diabetes Mother    Colon cancer Neg Hx    Prostate cancer Neg Hx    Rectal cancer Neg Hx    Stomach cancer Neg Hx      HOME MEDICATIONS: Allergies as of 12/08/2022       Reactions   Lisinopril Cough   cough        Medication List        Accurate as of December 08, 2022 10:11 AM. If you have any questions, ask your nurse or doctor.          Accu-Chek Aviva Plus test strip Generic drug: glucose blood 1 each by Other route 2 (two) times daily.   aspirin EC 81 MG tablet Take 1 tablet (81 mg total) by mouth at bedtime.   atorvastatin 20 MG tablet Commonly known as: LIPITOR TAKE ONE TABLET BY MOUTH EVERY MORNING   cilostazol 50 MG tablet Commonly known as: PLETAL Take 1 tablet (50 mg total) by mouth 2 (two) times daily.   diltiazem 120 MG 24 hr capsule Commonly known as: CARDIZEM CD TAKE ONE CAPSULE BY MOUTH AT BEDTIME   FreeStyle Libre 2 Reader Devi 1 Device by Does not apply route as directed.   FreeStyle Libre 2 Sensor Misc 1 Device by Does not apply route as directed.   Insulin Pen Needle 32G X 4 MM Misc 1 Device by Does not apply route in the morning, at noon, in the evening, and at bedtime.   isosorbide mononitrate 60 MG 24 hr tablet Commonly known as: IMDUR TAKE ONE TABLET BY MOUTH EVERY MORNING   Lantus SoloStar 100 UNIT/ML Solostar  Pen Generic drug: insulin glargine Inject 16 Units into the skin daily.   loratadine 10 MG tablet Commonly known as: CLARITIN Take 10 mg by mouth daily.   losartan 25 MG tablet Commonly known as: COZAAR Take 1 tablet (25 mg total) by mouth at bedtime.   nitroGLYCERIN 0.4 MG SL tablet Commonly known as: Nitrostat Place 1 tablet (0.4 mg total) under the tongue every 5 (five) minutes as needed.   pantoprazole 20 MG tablet Commonly known as: Protonix Take 1 tablet (20 mg total) by mouth daily. Take 30 minutes before breakfast   topiramate 25 MG tablet Commonly known as: TOPAMAX TAKE ONE TABLET BY MOUTH EVERY MORNING and TAKE ONE TABLET AT BEDTIME   traMADol 50 MG  tablet Commonly known as: ULTRAM Take 100 mg by mouth daily as needed.   VITAMIN D3 PO Take 850 mg by mouth 2 (two) times daily.         OBJECTIVE:   Vital Signs: BP 122/80 (BP Location: Left Arm, Patient Position: Sitting, Cuff Size: Large)   Pulse 84   Ht 5\' 3"  (1.6 m)   Wt 181 lb (82.1 kg)   SpO2 96%   BMI 32.06 kg/m   Wt Readings from Last 3 Encounters:  12/08/22 181 lb (82.1 kg)  08/17/22 182 lb (82.6 kg)  08/04/22 181 lb (82.1 kg)     Exam: General: Pt appears well and is in NAD Left thyroid nodule appreciated  Lungs: Clear with good BS bilat   Heart: RRR   Extremities: No pretibial edema.   Neuro: MS is good with appropriate affect, pt is alert and Ox3     DATA REVIEWED:  Lab Results  Component Value Date   HGBA1C 7.0 (A) 04/30/2022   HGBA1C 7.8 (A) 10/10/2021   HGBA1C 7.5 (A) 04/16/2021         Latest Reference Range & Units 12/08/22 10:37  Sodium 135 - 145 mEq/L 141  Potassium 3.5 - 5.1 mEq/L 3.9  Chloride 96 - 112 mEq/L 106  CO2 19 - 32 mEq/L 28  Glucose 70 - 99 mg/dL 12/10/22 (H)  BUN 6 - 23 mg/dL 14  Creatinine 407 - 6.80 mg/dL 8.81  Calcium 8.4 - 1.03 mg/dL 15.9 (H)  Albumin 3.5 - 5.2 g/dL 4.2  GFR 45.8 mL/min 45.09 (L)    Latest Reference Range & Units 12/08/22 10:37   VITD 30.00 - 100.00 ng/mL 33.47     Latest Reference Range & Units 04/30/22 11:03  TSH 0.35 - 5.50 uIU/mL 4.03  T4,Free(Direct) 0.60 - 1.60 ng/dL 06/30/22   ASSESSMENT / PLAN / RECOMMENDATIONS:   1) Type 2 Diabetes Mellitus, optimally controlled, With neuropathic, CKD III and macrovascular complications - Most recent A1c of 7.0 %. Goal A1c <7.5%.    -A1c at goal but she has been noted with glycemic excursions 80- >250 - She was supposed to be on Lantus 12 units daily and humalog with each meal, per daughter the pt stopped humalog all together and has been taking Lantus 18 units ??? - Pt daughter she contacted our office in 04/2022 and this is was the advise but I read the actual response which was to decrease lantus from 16 units to 12 units and typically with hypoglycemia , we suggest decreasing insulin rather than increasing  -In review of CGM download she has been noted with fasting tight bg's , will reduce basal insulin -They had declines GLP-1 agonists in the past    MEDICATIONS: -Decrease Lantus 14 units daily  -Correction factor : Humalog (BG -140/40) TIDQAC  EDUCATION / INSTRUCTIONS: BG monitoring instructions: Patient is instructed to check her blood sugars 3 times a day, before meals. Call [facility] if: BG persistently < 70  I reviewed the Rule of 15 for the treatment of hypoglycemia in detail with the patient. Literature supplied.   2) Diabetic complications:  Eye: Does not have known diabetic retinopathy.  Neuro/ Feet: Does have known diabetic peripheral neuropathy .  Renal: Patient does  have known baseline CKD. She   is on an ACEI/ARB at present.    3) MNG:   -No local neck symptoms -No prior history of FNA -Per patient the last ultrasound at [facility] was less than a  year ago -She signed ROI to [facility] to obtain these records but we did not get them - TFT's have been normal in the past    4) Hyperparathyroidism:  -Patient  signed ROI to get records from Dr. Daune Perch office as well as DXA scan from [facility] but we did not get these records  -Most likely the patient will be managed medically due to advanced age - GFR low but continues to fluctuate  - Serum calcium elevated but overall stable  -Will encourage hydration   F/U in 6 months  Signed electronically by: Lyndle Herrlich, MD  [facility]  [facility] [address] [place] [place], [place] [address] Phone: [phone] FAX: [phone]   CC: Chilton Greathouse, NP [address] [place] [address] Phone: [phone]  Fax: [phone]  Return to Endocrinology clinic as below: Future Appointments  Date Time Provider Department Center  07/27/2023  1:00 PM Butch Penny, NP [facility] None

## 2022-12-09 LAB — PARATHYROID HORMONE, INTACT (NO CA): PTH: 44 pg/mL (ref 16–77)

## 2022-12-10 ENCOUNTER — Ambulatory Visit: Payer: 59 | Admitting: Internal Medicine

## 2023-01-25 ENCOUNTER — Telehealth: Payer: Self-pay

## 2023-01-25 NOTE — Telephone Encounter (Signed)
Patient sister advised and verbalized understanding

## 2023-01-25 NOTE — Telephone Encounter (Signed)
Patient  fasting blood sugar when she wakes up have been in the 60's for last 2 days. Patient didn't take her insulin yesterday and her sugars were normal all day.  Patient sugar this morning without shot was 96. Patient daughter think that she is not eating enough. Patient is not connected for me to download.

## 2023-02-11 ENCOUNTER — Other Ambulatory Visit: Payer: Self-pay

## 2023-02-11 DIAGNOSIS — I723 Aneurysm of iliac artery: Secondary | ICD-10-CM

## 2023-03-01 ENCOUNTER — Other Ambulatory Visit: Payer: Self-pay

## 2023-03-01 ENCOUNTER — Telehealth: Payer: Self-pay

## 2023-03-01 MED ORDER — PANTOPRAZOLE SODIUM 20 MG PO TBEC: 20.0000 mg | DELAYED_RELEASE_TABLET | Freq: Every day | ORAL | 5 refills | Status: DC

## 2023-03-01 NOTE — Telephone Encounter (Signed)
Clinical notes faxed to [place] through Epic per fax request.  

## 2023-03-15 ENCOUNTER — Ambulatory Visit (HOSPITAL_COMMUNITY)
Admission: RE | Admit: 2023-03-15 | Discharge: 2023-03-15 | Disposition: A | Discharge status: Home / Self Care | Payer: 59 | Source: Ambulatory Visit | Attending: Vascular Surgery | Admitting: Vascular Surgery

## 2023-03-15 DIAGNOSIS — I723 Aneurysm of iliac artery: Secondary | ICD-10-CM

## 2023-03-15 LAB — POCT I-STAT CREATININE: Creatinine, Ser: 1.3 mg/dL — ABNORMAL HIGH (ref 0.44–1.00)

## 2023-03-15 MED ORDER — IOHEXOL 350 MG/ML SOLN
75.0000 mL | Freq: Once | INTRAVENOUS | Status: AC | PRN
  Administered 2023-03-15: 75 mL via INTRAVENOUS

## 2023-03-22 NOTE — Progress Notes (Unsigned)
[facility] [place]  ASSESSMENT / PLAN: 89 y.o. female with left internal iliac artery aneurysm, nearly thrombosed, measuring 27 mm in greatest diameter.  I counseled the patient extensively about these findings.  Follow-up with me in 1 year after CT scan.  CHIEF COMPLAINT: follow up CT scan  HISTORY OF PRESENT ILLNESS: Kristen Weber is a 89 y.o. female referred to clinic for evaluation after incidental discovery of an internal iliac artery aneurysm on CT scan of the abdomen and pelvis.  The patient has had problems with dysphagia prior to a upper endoscopy and esophageal dilation 04/10/2021.  She returned to her gastroenterologist last month reporting chronic constipation.  A CT scan of her abdomen pelvis was performed.  Incidentally noted was a left internal iliac artery aneurysm with a large burden of mural thrombus.  The patient is asymptomatic from an aneurysm standpoint.  I counseled her extensively about the natural history of aneurysmal disease.  We reviewed the threshold for treatment of 35 mm.  I explained the likely slow growth rate of these aneurysms.  Past Medical History:  Diagnosis Date   Abnormal cardiovascular stress test 09/20/2013   Possibly abnormal nuclear stress at [facility]-anteroseptal abnormality interpreted as possible breast attenuation as well, motion artifact. Normal EF. No TID. Watching medically   Allergy    Anxiety    Cataract    Diabetes ([facility]) 09/20/2013   Diabetes mellitus ([facility])    Type 2   Essential hypertension 09/20/2013   GERD (gastroesophageal reflux disease)    H/O: hysterectomy    Hyperlipidemia 09/20/2013   RA (rheumatoid arthritis) ([facility])    Sleep apnea    uses cpap   Thyroid disease    Thyroid Nodule    Past Surgical History:  Procedure Laterality Date   CARDIAC CATHETERIZATION  02/12/2015   LEFT HEART CATHETERIZATION WITH CORONARY ANGIOGRAM N/A 02/12/2015   Procedure: LEFT HEART CATHETERIZATION WITH CORONARY ANGIOGRAM;   Surgeon: Lennette Bihari, MD;  Location: [facility] CATH LAB;  Service: Cardiovascular;  Laterality: N/A;   TOTAL ABDOMINAL HYSTERECTOMY      Family History  Problem Relation Age of Onset   Diabetes Mother    Colon cancer Neg Hx    Prostate cancer Neg Hx    Rectal cancer Neg Hx    Stomach cancer Neg Hx     Social History   Socioeconomic History   Marital status: Widowed    Spouse name: Greggory Stallion   Number of children: 2   Years of education: 12   Highest education level: Not on file  Occupational History   Occupation: Retired  Tobacco Use   Smoking status: Never   Smokeless tobacco: Current    Types: Snuff  Vaping Use   Vaping Use: Never used  Substance and Sexual Activity   Alcohol use: No    Alcohol/week: 0.0 standard drinks of alcohol   Drug use: No   Sexual activity: Not Currently  Other Topics Concern   Not on file  Social History Narrative   Patient lives alone 03/04/17   Patient has 2 children.    Patient is retired.   Patient is right-handed.     Caffeine use: Drinks 1 cup coffee per day   Social Determinants of Health   Financial Resource Strain: Not on file  Food Insecurity: Not on file  Transportation Needs: Not on file  Physical Activity: Not on file  Stress: Not on file  Social Connections: Not on file  Intimate Partner Violence: Not on  file    Allergies  Allergen Reactions   Lisinopril Cough    cough    Current Outpatient Medications  Medication Sig Dispense Refill   ACCU-CHEK AVIVA PLUS test strip 1 each by Other route 2 (two) times daily.      aspirin EC 81 MG tablet Take 1 tablet (81 mg total) by mouth at bedtime. 90 tablet 3   atorvastatin (LIPITOR) 20 MG tablet TAKE ONE TABLET BY MOUTH EVERY MORNING 90 tablet 3   Cholecalciferol (VITAMIN D3 PO) Take 850 mg by mouth 2 (two) times daily.     cilostazol (PLETAL) 50 MG tablet Take 1 tablet (50 mg total) by mouth 2 (two) times daily. 60 tablet 1   Continuous Blood Gluc Receiver (FREESTYLE LIBRE 2  READER) DEVI 1 Device by Does not apply route as directed. 1 each 0   Continuous Blood Gluc Sensor (FREESTYLE LIBRE 2 SENSOR) MISC 1 Device by Does not apply route as directed. 2 each 11   diltiazem (CARDIZEM CD) 120 MG 24 hr capsule TAKE ONE CAPSULE BY MOUTH AT BEDTIME 90 capsule 3   insulin glargine (LANTUS SOLOSTAR) 100 UNIT/ML Solostar Pen Inject 16 Units into the skin daily. 15 mL 3   Insulin Pen Needle 32G X 4 MM MISC 1 Device by Does not apply route in the morning, at noon, in the evening, and at bedtime. 150 each 6   isosorbide mononitrate (IMDUR) 60 MG 24 hr tablet TAKE ONE TABLET BY MOUTH EVERY MORNING 90 tablet 3   loratadine (CLARITIN) 10 MG tablet Take 10 mg by mouth daily.     losartan (COZAAR) 25 MG tablet Take 1 tablet (25 mg total) by mouth at bedtime. 90 tablet 3   nitroGLYCERIN (NITROSTAT) 0.4 MG SL tablet Place 1 tablet (0.4 mg total) under the tongue every 5 (five) minutes as needed. 25 tablet 6   pantoprazole (PROTONIX) 20 MG tablet Take 1 tablet (20 mg total) by mouth daily. Take 30 minutes before breakfast 30 tablet 5   topiramate (TOPAMAX) 25 MG tablet TAKE ONE TABLET BY MOUTH EVERY MORNING and TAKE ONE TABLET AT BEDTIME 180 tablet 3   traMADol (ULTRAM) 50 MG tablet Take 100 mg by mouth daily as needed.     No current facility-administered medications for this visit.    PHYSICAL EXAM There were no vitals filed for this visit.  Well-appearing woman in no acute distress.  In a wheelchair. Regular rate and rhythm Unlabored breathing Soft, nondistended abdomen  PERTINENT LABORATORY AND RADIOLOGIC DATA  Most recent CBC    Latest Ref Rng & Units 01/08/2022   12:03 PM 09/27/2015   11:59 AM 02/12/2015    5:28 AM  CBC  WBC 4.0 - 10.5 K/uL 4.0  3.0  3.2   Hemoglobin 12.0 - 15.0 g/dL 09.8  11.9  14.7   Hematocrit 36.0 - 46.0 % 40.1  40.4  38.6   Platelets 150.0 - 400.0 K/uL 151.0  156  157      Most recent CMP    Latest Ref Rng & Units 03/15/2023    9:41 AM  12/08/2022   10:37 AM 04/30/2022   11:03 AM  CMP  Glucose 70 - 99 mg/dL  829  562   BUN 6 - 23 mg/dL  14  14   Creatinine 1.30 - 1.00 mg/dL 8.65  7.84  6.96   Sodium 135 - 145 mEq/L  141  141   Potassium 3.5 - 5.1 mEq/L  3.9  3.4  Chloride 96 - 112 mEq/L  106  107   CO2 19 - 32 mEq/L  28  28   Calcium 8.4 - 10.5 mg/dL  40.9  81.1     Renal function CrCl cannot be calculated (Unknown ideal weight.).  Hemoglobin A1C (%)  Date Value  12/08/2022 7.0 (A)   Hgb A1c MFr Bld (%)  Date Value  02/11/2015 6.6 (H)    Direct LDL  Date Value Ref Range Status  10/17/2014 64.8 mg/dL Final    Comment:    Optimal:  <100 mg/dLNear or Above Optimal:  100-129 mg/dLBorderline High:  130-159 mg/dLHigh:  160-189 mg/dLVery High:  >190 mg/dL    CT scan of the abdomen pelvis 01/14/2022.  Personally reviewed.  Moderate sized left internal iliac artery aneurysm measuring 27 mm.  This appears nearly thrombosed.  Rande Brunt. Lenell Antu, MD [facility] Phone Number: [phone] 03/22/2023 10:11 AM  Total time spent on preparing this encounter including chart review, data review, collecting history, examining the patient, coordinating care for this new patient, 60 minutes.  Portions of this report may have been transcribed using voice recognition software.  Every effort has been made to ensure accuracy; however, inadvertent computerized transcription errors may still be present.

## 2023-03-23 ENCOUNTER — Ambulatory Visit (INDEPENDENT_AMBULATORY_CARE_PROVIDER_SITE_OTHER): Payer: Self-pay | Admitting: Vascular Surgery

## 2023-03-23 DIAGNOSIS — I723 Aneurysm of iliac artery: Secondary | ICD-10-CM

## 2023-06-15 ENCOUNTER — Encounter: Payer: Self-pay | Admitting: Internal Medicine

## 2023-06-15 ENCOUNTER — Ambulatory Visit (INDEPENDENT_AMBULATORY_CARE_PROVIDER_SITE_OTHER): Payer: 59 | Admitting: Internal Medicine

## 2023-06-15 VITALS — BP 122/80 | HR 86 | Ht 63.0 in | Wt 184.0 lb

## 2023-06-15 DIAGNOSIS — E1169 Type 2 diabetes mellitus with other specified complication: Secondary | ICD-10-CM

## 2023-06-15 DIAGNOSIS — E1159 Type 2 diabetes mellitus with other circulatory complications: Secondary | ICD-10-CM | POA: Diagnosis not present

## 2023-06-15 DIAGNOSIS — E1142 Type 2 diabetes mellitus with diabetic polyneuropathy: Secondary | ICD-10-CM

## 2023-06-15 DIAGNOSIS — E210 Primary hyperparathyroidism: Secondary | ICD-10-CM

## 2023-06-15 DIAGNOSIS — E041 Nontoxic single thyroid nodule: Secondary | ICD-10-CM

## 2023-06-15 DIAGNOSIS — Z794 Long term (current) use of insulin: Secondary | ICD-10-CM | POA: Diagnosis not present

## 2023-06-15 NOTE — Progress Notes (Signed)
Name: Kristen Weber  Age/ Sex: 89 y.o., female   MRN/ DOB: [id number], 1934/03/10     PCP: Chilton Greathouse, NP   Reason for Endocrinology Evaluation: Type 2 Diabetes Mellitus  Initial Endocrine Consultative Visit: 01/03/2021    PATIENT IDENTIFIER: Kristen Weber is a 89 y.o. female with a past medical history of T2DM, CAD, HTN and Dyslipidemia. The patient has followed with Endocrinology clinic since 01/03/2021 for consultative assistance with management of her diabetes.  DIABETIC HISTORY:  Ms. Dettloff was diagnosed with DM yrs ago,Metformin-ineffective. Her hemoglobin A1c has ranged from 6.6% in 2016, peaking at 7.9% in 2015.  On her initial visit to our clinic she had an  A1c 6.1%, pt with recurrent hypoglycemia, she was on basal insulin and Glipizide . We stopped Glipizide, reduced basal insulin and offered add-on therapy ( GLP-1 agonist vs prandial insulin) due to low GFR of 38 . Opted for prandial insulin     MNG/PARATHYROID HISTORY:  Patient has been following up with Dr. Sharl Ma for multinodular goiter and hypercalcemia for many years. She has no history of thyroid nodule FNAs in the past   SUBJECTIVE:   During the last visit (12/08/2022): A1c 7.0 %.     Today (06/15/2023): [name] is here for a follow up on diabetes management.  She is accompanied by her daughter. She checks her blood sugars 3 times daily through freestyle libre. The patient has had hypoglycemic episodes since the last clinic visit.     She continues to follow-up with vascular and vein specialist for left internal iliac artery aneurysm Follows with neurology for essential tremors   S/P left cataract 02/2023  Denies local neck swelling  Denies nausea, vomiting Denies diarrhea but has constipation    HOME DIABETES REGIMEN:  Lantus 14 units with each meal  CF: HUmalog (BG-130/ 40) Vitamin D 2000 iu daily    Statin: yes ACE-I/ARB: yes Prior Diabetic Education: yes   CONTINUOUS GLUCOSE MONITORING  RECORD INTERPRETATION    Dates of Recording:7/10-7/23/2024  Sensor description:dexcom  Results statistics:   CGM use % of time 66  Average and SD 129/29.3  Time in range  89%  % Time Above 180 10  % Time above 250 0  % Time Below target 1    Glycemic patterns summary: Bg's optima through the day and night   Hyperglycemic episodes  postprandial  Hypoglycemic episodes occurred early morning  Overnight periods: trends down      DIABETIC COMPLICATIONS: Microvascular complications:  Neuropathy, CKD Denies:  retinopathy Last Eye Exam: Completed 12/2020  Macrovascular complications:  CAD, CVA Denies:  PVD   HISTORY:  Past Medical History:  Past Medical History:  Diagnosis Date   Abnormal cardiovascular stress test 09/20/2013   Possibly abnormal nuclear stress at [facility]-anteroseptal abnormality interpreted as possible breast attenuation as well, motion artifact. Normal EF. No TID. Watching medically   Allergy    Anxiety    Cataract    Diabetes (HCC) 09/20/2013   Diabetes mellitus (HCC)    Type 2   Essential hypertension 09/20/2013   GERD (gastroesophageal reflux disease)    H/O: hysterectomy    Hyperlipidemia 09/20/2013   RA (rheumatoid arthritis) (HCC)    Sleep apnea    uses cpap   Thyroid disease    Thyroid Nodule   Past Surgical History:  Past Surgical History:  Procedure Laterality Date   CARDIAC CATHETERIZATION  02/12/2015   LEFT HEART CATHETERIZATION WITH CORONARY ANGIOGRAM N/A 02/12/2015   Procedure: LEFT HEART  CATHETERIZATION WITH CORONARY ANGIOGRAM;  Surgeon: Lennette Bihari, MD;  Location[facility] C[facility];  Service: Cardiovascular;  Laterality: N/A;   TOTAL ABDOMINAL HYSTERECTOMY     Social History:  reports that she has never smoked. Her smokeless tobacco use includes snuff. She reports that she does not drink alcohol and does not use drugs. Family History:  Family History  Problem Relation Age of Onset   Diabetes Mother    Colon cancer Neg Hx     Prostate cancer Neg Hx    Rectal cancer Neg Hx    Stomach cancer Neg Hx      HOME MEDICATIONS: Allergies as of 06/15/2023       Reactions   Lisinopril Cough   cough        Medication List        Accurate as of June 15, 2023 11:17 AM. If you have any questions, ask your nurse or doctor.          Accu-Chek Aviva Plus test strip Generic drug: glucose blood 1 each by Other route 2 (two) times daily.   aspirin EC 81 MG tablet Take 1 tablet (81 mg total) by mouth at bedtime.   atorvastatin 20 MG tablet Commonly known as: LIPITOR TAKE ONE TABLET BY MOUTH EVERY MORNING   cilostazol 50 MG tablet Commonly known as: PLETAL Take 1 tablet (50 mg total) by mouth 2 (two) times daily.   diltiazem 120 MG 24 hr capsule Commonly known as: CARDIZEM CD TAKE ONE CAPSULE BY MOUTH AT BEDTIME   FreeStyle Libre 2 Reader Devi 1 Device by Does not apply route as directed.   FreeStyle Libre 2 Sensor Misc 1 Device by Does not apply route as directed.   Insulin Pen Needle 32G X 4 MM Misc 1 Device by Does not apply route in the morning, at noon, in the evening, and at bedtime.   isosorbide mononitrate 60 MG 24 hr tablet Commonly known as: IMDUR TAKE ONE TABLET BY MOUTH EVERY MORNING   Lantus SoloStar 100 UNIT/ML Solostar Pen Generic drug: insulin glargine Inject 16 Units into the skin daily.   loratadine 10 MG tablet Commonly known as: CLARITIN Take 10 mg by mouth daily.   losartan 25 MG tablet Commonly known as: COZAAR Take 1 tablet (25 mg total) by mouth at bedtime.   nitroGLYCERIN 0.4 MG SL tablet Commonly known as: Nitrostat Place 1 tablet (0.4 mg total) under the tongue every 5 (five) minutes as needed.   pantoprazole 20 MG tablet Commonly known as: Protonix Take 1 tablet (20 mg total) by mouth daily. Take 30 minutes before breakfast   topiramate 25 MG tablet Commonly known as: TOPAMAX TAKE ONE TABLET BY MOUTH EVERY MORNING and TAKE ONE TABLET AT BEDTIME    traMADol 50 MG tablet Commonly known as: ULTRAM Take 100 mg by mouth daily as needed.   VITAMIN D3 PO Take 850 mg by mouth 2 (two) times daily.         OBJECTIVE:   Vital Signs: BP 122/80 (BP Location: Left Arm, Patient Position: Sitting, Cuff Size: Large)   Pulse 86   Ht 5\' 3"  (1.6 m)   Wt 184 lb (83.5 kg)   SpO2 94%   BMI 32.59 kg/m   Wt Readings from Last 3 Encounters:  06/15/23 184 lb (83.5 kg)  12/08/22 181 lb (82.1 kg)  08/17/22 182 lb (82.6 kg)     Exam: General: Pt appears well and is in NAD Right thyroid asymmetry  appreciated  Lungs: Clear with good BS bilat   Heart: RRR   Extremities: No pretibial edema.   Neuro: MS is good with appropriate affect, pt is alert and Ox3     DATA REVIEWED:  Lab Results  Component Value Date   HGBA1C 7.0 (A) 12/08/2022   HGBA1C 7.0 (A) 04/30/2022   HGBA1C 7.8 (A) 10/10/2021        04/30/2023  BUN 19 CR 1.1 GFR 49 Ca 11.1 Alb 4 A1c 7.0% LDL 34 Alb/Cr 26.4 TSH 2.260    Thyroid Ultrasound 03/08/2021 Nodule # 1:  Prior biopsy: No  Location: Right; inferior  Maximum size: 1.1 cm; Other 2 dimensions: 0.8 x 1.0 cm, previously, 1.2 x 1.0 x 1.0 cm  Composition: solid/almost completely solid (2)  Echogenicity: isoechoic (1)  Shape: not taller-than-wide (0)  Margins: ill-defined (0)  Echogenic foci: none (0)  ACR TI-RADS total points: 3.  ACR TI-RADS risk category:  TR3 (3 points).  Significant change in size (>/= 20% in two dimensions and minimal increase of 2 mm): No  Change in features: No  Change in ACR TI-RADS risk category: No  ACR TI-RADS recommendations:  Given size (<1.4 cm) and appearance, this nodule does NOT meet TI-RADS criteria for biopsy or dedicated follow-up.  _________________________________________________________  Subcentimeter left thyroid nodule does not meet criteria for FNA or imaging follow-up. Procedure Note  Mir, Jeannine Kitten, MD - 03/11/2021 Formatting  of this note might be different from the original. CLINICAL DATA:  Difficulty swallowing pills  EXAM: THYROID ULTRASOUND  TECHNIQUE: Ultrasound examination of the thyroid gland and adjacent soft tissues was performed.  COMPARISON:  Ultrasound neck 01/13/2019  FINDINGS: Parenchymal Echotexture: Mildly heterogeneous  Isthmus: 0.2 cm  Right lobe: 5.0 x 1.7 x 1.5 cm  Left lobe: 3.0 x 1.2 x 1.4 cm  _________________________________________________________  Estimated total number of nodules >/= 1 cm: 1  Number of spongiform nodules >/=  2 cm not described below (TR1): 0  Number of mixed cystic and solid nodules >/= 1.5 cm not described below (TR2): 0  _________________________________________________________  Nodule # 1:  Prior biopsy: No  Location: Right; inferior  Maximum size: 1.1 cm; Other 2 dimensions: 0.8 x 1.0 cm, previously, 1.2 x 1.0 x 1.0 cm  Composition: solid/almost completely solid (2)  Echogenicity: isoechoic (1)  Shape: not taller-than-wide (0)  Margins: ill-defined (0)  Echogenic foci: none (0)  ACR TI-RADS total points: 3.  ACR TI-RADS risk category:  TR3 (3 points).  Significant change in size (>/= 20% in two dimensions and minimal increase of 2 mm): No  Change in features: No  Change in ACR TI-RADS risk category: No  ACR TI-RADS recommendations:  Given size (<1.4 cm) and appearance, this nodule does NOT meet TI-RADS criteria for biopsy or dedicated follow-up.  _________________________________________________________  Subcentimeter left thyroid nodule does not meet criteria for FNA or imaging follow-up.  IMPRESSION: No suspicious thyroid nodules.    Old records , labs and images have been reviewed.    ASSESSMENT / PLAN / RECOMMENDATIONS:   1) Type 2 Diabetes Mellitus, optimally controlled, With neuropathic, CKD III and macrovascular complications - Most recent A1c of 7.0 %. Goal A1c <7.5%.    -A1c at goal  -Due to  noted hypoglycemia overnight, will decrease Lantus as below -They had declines GLP-1 agonists in the past    MEDICATIONS: -Decrease Lantus 12 units daily  -Correction factor : Humalog (BG -140/40) TIDQAC  EDUCATION / INSTRUCTIONS: BG monitoring instructions: Patient is instructed  to check her blood sugars 3 times a day, before meals. Call [facility] if: BG persistently < 70  I reviewed the Rule of 15 for the treatment of hypoglycemia in detail with the patient. Literature supplied.   2) Diabetic complications:  Eye: Does not have known diabetic retinopathy.  Neuro/ Feet: Does have known diabetic peripheral neuropathy .  Renal: Patient does  have known baseline CKD. She   is on an ACEI/ARB at present.    3) MNG:   -No local neck symptoms -No prior history of FNA -Per [facility]where ultrasound dated 4/60/2022 none of the nodule met criteria for follow-up    4) Hyperparathyroidism:  -Patient signed ROI to get records from Dr. Daune Perch office as well as DXA scan from [facility] but we never  get these records  -Most likely the patient will be managed medically due to advanced age - GFR low but continues to fluctuate  - Serum calcium elevated but overall stable  - Encouraged hydration   F/U in 6 months  Signed electronically by: Lyndle Herrlich, MD  [facility]  [facility] [address] [place], [place] [address] Phone: [phone] FAX: [phone]   CC: Chilton Greathouse, NP [address] [place] [address] Phone: [phone]  Fax: [phone]  Return to Endocrinology clinic as below: Future Appointments  Date Time Provider Department Center  07/27/2023  1:00 PM Butch Penny, NP [facility] None  09/01/2023 10:00 AM Jake Bathe, MD [facility]  12/21/2023 10:30 AM Minette Manders, Konrad Dolores, MD [facility] None

## 2023-06-15 NOTE — Patient Instructions (Signed)
-   Decrease  Lantus 12 units daily  -Humalog correctional insulin: Use the scale below to help guide you before each meal   Blood sugar before meal Number of units to inject  Less than 180 0 unit  181 -  220 1 units  221 -  260 2 units  261 -  300 3 units  301 -  340 4 units  341 -  380 5 units  381 -  420 6 units      HOW TO TREAT LOW BLOOD SUGARS (Blood sugar LESS THAN 70 MG/DL) Please follow the RULE OF 15 for the treatment of hypoglycemia treatment (when your (blood sugars are less than 70 mg/dL)   STEP 1: Take 15 grams of carbohydrates when your blood sugar is low, which includes:  3-4 GLUCOSE TABS  OR 3-4 OZ OF JUICE OR REGULAR SODA OR ONE TUBE OF GLUCOSE GEL    STEP 2: RECHECK blood sugar in 15 MINUTES STEP 3: If your blood sugar is still low at the 15 minute recheck --> then, go back to STEP 1 and treat AGAIN with another 15 grams of carbohydrates.

## 2023-06-16 ENCOUNTER — Encounter: Payer: Self-pay | Admitting: Internal Medicine

## 2023-07-26 NOTE — Progress Notes (Unsigned)
PATIENT: Kristen Weber DOB: 1934/08/13  REASON FOR VISIT: follow up HISTORY FROM: patient  Virtual Visit via Video Note  I connected with [name] on 07/26/23 at  1:00 PM EDT by a video enabled telemedicine application located remotely at [facility] and verified that I am speaking with the correct person using two identifiers who was located at their own home.   I discussed the limitations of evaluation and management by telemedicine and the availability of in person appointments. The patient expressed understanding and agreed to proceed.   PATIENT: Kristen Weber DOB: Mar 08, 1934  REASON FOR VISIT: follow up HISTORY FROM: patient  HISTORY OF PRESENT ILLNESS: Today 07/26/23:  Kristen Weber is a 89 y.o. female with a history of essential tremor. Returns today for follow-up.  Patient feels that her tremor has remained relatively stable.  She continues on Topamax 25 mg twice a day.  Her daughter reports that her memory has gotten worse.  She states that she is forgetful with where she puts objects.   Patient has never had a formal memory evaluation  HISTORY Kristen Weber is a 89 y.o. female who has been followed in this office for essential tremor.  She was unable to do a video visit.  She reports that her symptoms have remained stable.  She continues on Topamax 25 mg twice a day.  She states that she has a hard time with her handwriting but she is able to button buttons and feed herself.  Denies any new symptoms.   REVIEW OF SYSTEMS: Out of a complete 14 system review of symptoms, the patient complains only of the following symptoms, and all other reviewed systems are negative.  ALLERGIES: Allergies  Allergen Reactions   Lisinopril Cough    cough    HOME MEDICATIONS: Outpatient Medications Prior to Visit  Medication Sig Dispense Refill   ACCU-CHEK AVIVA PLUS test strip 1 each by Other route 2 (two) times daily.      aspirin EC 81 MG tablet Take 1 tablet  (81 mg total) by mouth at bedtime. 90 tablet 3   atorvastatin (LIPITOR) 20 MG tablet TAKE ONE TABLET BY MOUTH EVERY MORNING 90 tablet 3   Cholecalciferol (VITAMIN D3 PO) Take 850 mg by mouth 2 (two) times daily.     cilostazol (PLETAL) 50 MG tablet Take 1 tablet (50 mg total) by mouth 2 (two) times daily. 60 tablet 1   Continuous Blood Gluc Receiver (FREESTYLE LIBRE 2 READER) DEVI 1 Device by Does not apply route as directed. 1 each 0   Continuous Blood Gluc Sensor (FREESTYLE LIBRE 2 SENSOR) MISC 1 Device by Does not apply route as directed. 2 each 11   diltiazem (CARDIZEM CD) 120 MG 24 hr capsule TAKE ONE CAPSULE BY MOUTH AT BEDTIME 90 capsule 3   insulin glargine (LANTUS SOLOSTAR) 100 UNIT/ML Solostar Pen Inject 16 Units into the skin daily. 15 mL 3   Insulin Pen Needle 32G X 4 MM MISC 1 Device by Does not apply route in the morning, at noon, in the evening, and at bedtime. 150 each 6   isosorbide mononitrate (IMDUR) 60 MG 24 hr tablet TAKE ONE TABLET BY MOUTH EVERY MORNING 90 tablet 3   loratadine (CLARITIN) 10 MG tablet Take 10 mg by mouth daily.     losartan (COZAAR) 25 MG tablet Take 1 tablet (25 mg total) by mouth at bedtime. 90 tablet 3   nitroGLYCERIN (NITROSTAT) 0.4 MG SL tablet Place 1 tablet (0.4  mg total) under the tongue every 5 (five) minutes as needed. 25 tablet 6   pantoprazole (PROTONIX) 20 MG tablet Take 1 tablet (20 mg total) by mouth daily. Take 30 minutes before breakfast 30 tablet 5   topiramate (TOPAMAX) 25 MG tablet TAKE ONE TABLET BY MOUTH EVERY MORNING and TAKE ONE TABLET AT BEDTIME 180 tablet 3   traMADol (ULTRAM) 50 MG tablet Take 100 mg by mouth daily as needed.     No facility-administered medications prior to visit.    PAST MEDICAL HISTORY: Past Medical History:  Diagnosis Date   Abnormal cardiovascular stress test 09/20/2013   Possibly abnormal nuclear stress at [facility]-anteroseptal abnormality interpreted as possible breast attenuation as well, motion  artifact. Normal EF. No TID. Watching medically   Allergy    Anxiety    Cataract    Diabetes (HCC) 09/20/2013   Diabetes mellitus (HCC)    Type 2   Essential hypertension 09/20/2013   GERD (gastroesophageal reflux disease)    H/O: hysterectomy    Hyperlipidemia 09/20/2013   RA (rheumatoid arthritis) (HCC)    Sleep apnea    uses cpap   Thyroid disease    Thyroid Nodule    PAST SURGICAL HISTORY: Past Surgical History:  Procedure Laterality Date   CARDIAC CATHETERIZATION  02/12/2015   LEFT HEART CATHETERIZATION WITH CORONARY ANGIOGRAM N/A 02/12/2015   Procedure: LEFT HEART CATHETERIZATION WITH CORONARY ANGIOGRAM;  Surgeon: Lennette Bihari, MD;  Location: [facility] CATH LAB;  Service: Cardiovascular;  Laterality: N/A;   TOTAL ABDOMINAL HYSTERECTOMY      FAMILY HISTORY: Family History  Problem Relation Age of Onset   Diabetes Mother    Colon cancer Neg Hx    Prostate cancer Neg Hx    Rectal cancer Neg Hx    Stomach cancer Neg Hx     SOCIAL HISTORY: Social History   Socioeconomic History   Marital status: Widowed    Spouse name: Greggory Stallion   Number of children: 2   Years of education: 12   Highest education level: Not on file  Occupational History   Occupation: Retired  Tobacco Use   Smoking status: Never   Smokeless tobacco: Current    Types: Snuff  Vaping Use   Vaping status: Never Used  Substance and Sexual Activity   Alcohol use: No    Alcohol/week: 0.0 standard drinks of alcohol   Drug use: No   Sexual activity: Not Currently  Other Topics Concern   Not on file  Social History Narrative   Patient lives alone 03/04/17   Patient has 2 children.    Patient is retired.   Patient is right-handed.     Caffeine use: Drinks 1 cup coffee per day   Social Determinants of Health   Financial Resource Strain: Low Risk  (11/24/2022)   [facility], [facility]   Overall Financial Resource Strain (CARDIA)    Difficulty of Paying Living Expenses: Not hard  at all  Food Insecurity: No Food Insecurity (11/24/2022)   Received from [facility], [facility]   Hunger Vital Sign    Worried About Running Out of Food in the Last Year: Never true    Ran Out of Food in the Last Year: Never true  Transportation Needs: No Transportation Needs (11/24/2022)   Received from [facility], [facility]   [facility] - Transportation    Lack of Transportation (Medical): No    Lack of Transportation (Non-Medical): No  Physical  Activity: Sufficiently Active (11/24/2022)   Received from [facility], [facility]   Exercise Vital Sign    Days of Exercise per Week: 6 days    Minutes of Exercise per Session: 120 min  Stress: No Stress Concern Present (11/24/2022)   Received from [facility], [facility] - Occupational Stress Questionnaire    Feeling of Stress : Not at all  Social Connections: Moderately Integrated (11/24/2022)   Received from [facility], [facility]   Social Connection and Isolation Panel [NHANES]    Frequency of Communication with Friends and Family: More than three times a week    Frequency of Social Gatherings with Friends and Family: More than three times a week    Attends Religious Services: More than 4 times per year    Active Member of [employer] or Organizations: Yes    Attends Banker Meetings: More than 4 times per year    Marital Status: Widowed  Intimate Partner Violence: Not At Risk (11/24/2022)   Received from [facility], [facility]   Humiliation, Afraid, Rape, and Kick questionnaire    Fear of Current or Ex-Partner: No    Emotionally Abused: No    Physically Abused: No    Sexually Abused: No      PHYSICAL EXAM Generalized: Well developed, in no acute distress   Neurological examination  Mentation: Alert oriented to time, place, history taking. Follows all commands speech and language fluent Cranial nerve II-XII: Facial symmetry  noted  DIAGNOSTIC DATA (LABS, IMAGING, TESTING) - I reviewed patient records, labs, notes, testing and imaging myself where available.  Lab Results  Component Value Date   WBC 4.0 01/08/2022   HGB 13.2 01/08/2022   HCT 40.1 01/08/2022   MCV 89.6 01/08/2022   PLT 151.0 01/08/2022      Component Value Date/Time   NA 141 12/08/2022 1037   K 3.9 12/08/2022 1037   CL 106 12/08/2022 1037   CO2 28 12/08/2022 1037   GLUCOSE 134 (H) 12/08/2022 1037   BUN 14 12/08/2022 1037   CREATININE 1.30 (H) 03/15/2023 0941   CALCIUM 11.1 (H) 12/08/2022 1037   PROT 6.3 01/08/2022 1203   ALBUMIN 4.2 12/08/2022 1037   AST 15 01/08/2022 1203   ALT 12 01/08/2022 1203   ALKPHOS 67 01/08/2022 1203   BILITOT 0.5 01/08/2022 1203   GFRNONAA 49 (L) 09/27/2015 1159   GFRAA 56 (L) 09/27/2015 1159   Lab Results  Component Value Date   CHOL 184 10/17/2014   HDL 44.80 10/17/2014   LDLDIRECT 64.8 10/17/2014   TRIG 311.0 (H) 10/17/2014   CHOLHDL 4 10/17/2014   Lab Results  Component Value Date   HGBA1C 7.0 (A) 12/08/2022   No results found for: "VITAMINB12" Lab Results  Component Value Date   TSH 4.03 04/30/2022      ASSESSMENT AND PLAN 89 y.o. year old female  has a past medical history of Abnormal cardiovascular stress test (09/20/2013), Allergy, Anxiety, Cataract, Diabetes (HCC) (09/20/2013), Diabetes mellitus (HCC), Essential hypertension (09/20/2013), GERD (gastroesophageal reflux disease), H/O: hysterectomy, Hyperlipidemia (09/20/2013), RA (rheumatoid arthritis) (HCC), Sleep apnea, and Thyroid disease. here with:  Essential tremor  -Reduce Topamax to 25 mg at bedtime.  We will see if her memory improves with a decreased dose of Topamax.  If not I advised that they would need to do a formal memory evaluation. -- Follow-up in 6 months or sooner if  needed   Butch Penny, MSN, NP-C 07/26/2023, 10:51 AM [facility] [address], [address] [place], [place] [address] [phone]

## 2023-07-27 ENCOUNTER — Telehealth (INDEPENDENT_AMBULATORY_CARE_PROVIDER_SITE_OTHER): Payer: 59 | Admitting: Adult Health

## 2023-07-27 DIAGNOSIS — G250 Essential tremor: Secondary | ICD-10-CM

## 2023-07-27 MED ORDER — TOPIRAMATE 25 MG PO TABS: 25.0000 mg | ORAL_TABLET | Freq: Every day | ORAL | 1 refills | Status: DC

## 2023-07-28 ENCOUNTER — Telehealth: Payer: Self-pay | Admitting: Adult Health

## 2023-07-28 NOTE — Telephone Encounter (Signed)
LVM at 10:37 am Calling about prescription that was sen in for pt Kristen Weber. Have a question about topiramate (TOPAMAX) 25 MG tablet  that was sent in.

## 2023-07-28 NOTE — Telephone Encounter (Signed)
Spoke to daughter  (checked DPR) Daughter wants to give pt prevagen to help with memory Per Megan,NP can give to pt if daughter wants Medication is a OTC and still decrease Topirmate to 25mg  at bedtime and see if that helps with pt memory.Per last office note did discuss and agree to decrease Topiramate to 25 mg nightly Daughter expressed understanding and thanked me for calling

## 2023-07-31 ENCOUNTER — Other Ambulatory Visit: Payer: Self-pay | Admitting: Adult Health

## 2023-08-11 ENCOUNTER — Other Ambulatory Visit: Payer: Self-pay

## 2023-08-11 DIAGNOSIS — E1169 Type 2 diabetes mellitus with other specified complication: Secondary | ICD-10-CM

## 2023-08-11 MED ORDER — LANTUS SOLOSTAR 100 UNIT/ML ~~LOC~~ SOPN
12.0000 [IU] | PEN_INJECTOR | Freq: Every day | SUBCUTANEOUS | 1 refills | Status: DC
Start: 2023-08-11 — End: 2023-12-21

## 2023-08-14 ENCOUNTER — Other Ambulatory Visit: Payer: Self-pay | Admitting: Adult Health

## 2023-09-01 ENCOUNTER — Ambulatory Visit: Payer: 59 | Attending: Cardiology | Admitting: Cardiology

## 2023-09-01 ENCOUNTER — Encounter: Payer: Self-pay | Admitting: Cardiology

## 2023-09-01 VITALS — BP 114/80 | HR 83 | Ht 63.0 in | Wt 185.2 lb

## 2023-09-01 DIAGNOSIS — I10 Essential (primary) hypertension: Secondary | ICD-10-CM | POA: Diagnosis not present

## 2023-09-01 DIAGNOSIS — I723 Aneurysm of iliac artery: Secondary | ICD-10-CM

## 2023-09-01 DIAGNOSIS — I2089 Other forms of angina pectoris: Secondary | ICD-10-CM

## 2023-09-01 DIAGNOSIS — I739 Peripheral vascular disease, unspecified: Secondary | ICD-10-CM

## 2023-09-01 NOTE — Progress Notes (Signed)
  Cardiology Office Note:  .   Date:  09/01/2023  ID:  Kristen Weber, DOB 1934-10-06, MRN [id number] PCP: Chilton Greathouse, NP  [facility] Providers Cardiologist:  Donato Schultz, MD     History of Present Illness: Kristen Weber   Kristen Weber is a 89 y.o. female Discussed withthe use of AI scribe   History of Present Illness   The patient, an 89 year old with a history of peripheral vascular disease, coronary artery disease, hypertension, and hyperlipidemia, presents for a follow-up visit. She underwent cardiac catheterization in 2016, revealing coronary disease not amenable to PCI. She has been evaluated by vascular surgery and [facility] for peripheral vascular disease and is currently managed medically. She experienced a TIA in October 2021. Her anginal symptoms are controlled with isosorbide and she is on atorvastatin 20 mg.  However, she reports leg pain and fatigue, which affects her mobility. She has no new complaints related to her cardiac or vascular conditions.  Despite these issues, her overall condition appears stable with no new or worsening symptoms.          ROS: mild memory impairment  Studies Reviewed: Kristen Weber   EKG Interpretation Date/Time:  Wednesday September 01 2023 10:02:27 EDT Ventricular Rate:  76 PR Interval:  176 QRS Duration:  84 QT Interval:  364 QTC Calculation: 409 R Axis:   -17  Text Interpretation: Sinus rhythm with occasional Premature ventricular complexes When compared with ECG of 27-Sep-2015 11:14, No significant change since last tracing Confirmed by Donato Schultz ([id number]) on 09/01/2023 10:07:48 AM    Results DIAGNOSTIC Echo: EF 60% EKG: Normal (09/01/2023) Lower extremities Doppler: Peripheral vascular disease with good collateral blood flow (2023)  Risk Assessment/Calculations:            Physical Exam:   VS:  BP 114/80   Pulse 83   Ht 5\' 3"  (1.6 m)   Wt 185 lb 3.2 oz (84 kg)   SpO2 95%   BMI 32.81 kg/m    Wt Readings from Last 3 Encounters:   09/01/23 185 lb 3.2 oz (84 kg)  06/15/23 184 lb (83.5 kg)  12/08/22 181 lb (82.1 kg)    GEN: Well nourished, well developed in no acute distress NECK: No JVD; No carotid bruits CARDIAC: RRR, no murmurs, no rubs, no gallops RESPIRATORY:  Clear to auscultation without rales, wheezing or rhonchi  ABDOMEN: Soft, non-tender, non-distended EXTREMITIES:  No edema; No deformity   ASSESSMENT AND PLAN: .    Assessment and Plan    Peripheral Vascular Disease Reports no change in leg pain. Prior evaluation by vascular surgery and [facility] with decision for medical management. Lower extremity Dopplers last year showed disease but good collateral blood flow. -Continue current medical management. ASA Pletal  Coronary Artery Disease Stable angina controlled with Isosorbide. Prior cardiac catheterization in 2016 showed disease not amenable to PCI. -Continue Isosorbide.  Hypertension Stable, blood pressure within normal range during visit. -Continue current antihypertensive regimen.  Hyperlipidemia Stable, on Atorvastatin 20mg . -Continue Atorvastatin 20mg  daily.  Transient Ischemic Attack (TIA) History of TIA in October 2021. -Continue current medical management.  Follow-up in 1 year unless changes occur.             Signed, Donato Schultz, MD

## 2023-09-01 NOTE — Patient Instructions (Signed)
Medication Instructions:   *If you need a refill on your cardiac medications before your next appointment, please call your pharmacy*   Lab Work:  If you have labs (blood work) drawn today and your tests are completely normal, you will receive your results only by: MyChart Message (if you have MyChart) OR A paper copy in the mail If you have any lab test that is abnormal or we need to change your treatment, we will call you to review the results.   Testing/Procedures:    Follow-Up: At [facility], you and your health needs are our priority.  As part of our continuing mission to provide you with exceptional heart care, we have created designated Provider Care Teams.  These Care Teams include your primary Cardiologist (physician) and Advanced Practice Providers (APPs -  Physician Assistants and Nurse Practitioners) who all work together to provide you with the care you need, when you need it.  We recommend signing up for the patient portal called "MyChart".  Sign up information is provided on this After Visit Summary.  MyChart is used to connect with patients for Virtual Visits (Telemedicine).  Patients are able to view lab/test results, encounter notes, upcoming appointments, etc.  Non-urgent messages can be sent to your provider as well.   To learn more about what you can do with MyChart, go to [email or web address].    Your next appointment:   1 year(s)  Provider:   Donato Schultz, MD     Other Instructions

## 2023-09-04 ENCOUNTER — Other Ambulatory Visit: Payer: Self-pay | Admitting: Cardiology

## 2023-09-07 ENCOUNTER — Telehealth: Payer: Self-pay | Admitting: Gastroenterology

## 2023-09-07 NOTE — Telephone Encounter (Signed)
PT daughter is calling to speak about her mother's symptoms. She has been experiencing severe abdominal pain and would like to see if anything can be prescribed for her. Please advise.

## 2023-09-08 NOTE — Telephone Encounter (Signed)
Pt daughter Steward Drone stated that she had a good BM yesterday evening and the pain is better.Steward Drone questioning if there is something that she could take to prevent the constipation. Steward Drone was notified that she could use MiraLAX daily. Steward Drone verbalized understanding with all questions answered.

## 2023-09-10 ENCOUNTER — Other Ambulatory Visit: Payer: Self-pay

## 2023-09-10 MED ORDER — PANTOPRAZOLE SODIUM 20 MG PO TBEC: 20.0000 mg | DELAYED_RELEASE_TABLET | Freq: Every day | ORAL | 5 refills | Status: DC

## 2023-09-10 NOTE — Telephone Encounter (Signed)
Pantoprazole refilled as pharmacy requested. 

## 2023-10-02 ENCOUNTER — Other Ambulatory Visit: Payer: Self-pay | Admitting: Cardiology

## 2023-12-07 ENCOUNTER — Telehealth: Payer: Self-pay | Admitting: Cardiology

## 2023-12-07 NOTE — Telephone Encounter (Signed)
 Spoke to dtr, dpr on file. Pt been SOB w/ exertion for the last week now.  PCP instructed them to discuss w/ cardiology. Denies CP, edema/wt gain, dizziness/light headedness.  Denies recent illness. Discussed with Dr. Jeffrie.   Pt scheduled to see DOD tomorrow for further evaluation. Dtr agreeable to plan.

## 2023-12-07 NOTE — Telephone Encounter (Signed)
 Pt c/o Shortness Of Breath: STAT if SOB developed within the last 24 hours or pt is noticeably SOB on the phone  1. Are you currently SOB (can you hear that pt is SOB on the phone)?    2. How long have you been experiencing SOB?  About 1 week   3. Are you SOB when sitting or when up moving around?  When up and moving around   4. Are you currently experiencing any other symptoms?  No

## 2023-12-08 ENCOUNTER — Ambulatory Visit: Payer: 59 | Attending: Cardiology | Admitting: Cardiology

## 2023-12-08 ENCOUNTER — Encounter: Payer: Self-pay | Admitting: Cardiology

## 2023-12-08 ENCOUNTER — Encounter: Payer: Self-pay | Admitting: *Deleted

## 2023-12-08 VITALS — BP 102/71 | HR 79 | Resp 16 | Ht 63.0 in | Wt 189.4 lb

## 2023-12-08 DIAGNOSIS — R0609 Other forms of dyspnea: Secondary | ICD-10-CM | POA: Diagnosis not present

## 2023-12-08 DIAGNOSIS — I739 Peripheral vascular disease, unspecified: Secondary | ICD-10-CM | POA: Diagnosis not present

## 2023-12-08 DIAGNOSIS — I25118 Atherosclerotic heart disease of native coronary artery with other forms of angina pectoris: Secondary | ICD-10-CM | POA: Diagnosis not present

## 2023-12-08 DIAGNOSIS — K222 Esophageal obstruction: Secondary | ICD-10-CM | POA: Diagnosis not present

## 2023-12-08 MED ORDER — NITROGLYCERIN 0.4 MG SL SUBL: 0.4000 mg | SUBLINGUAL_TABLET | SUBLINGUAL | 2 refills | Status: AC | PRN

## 2023-12-08 NOTE — Patient Instructions (Signed)
 Medication Instructions:  Your physician recommends that you continue on your current medications as directed. Please refer to the Current Medication list given to you today.  *If you need a refill on your cardiac medications before your next appointment, please call your pharmacy*   Lab Work: none If you have labs (blood work) drawn today and your tests are completely normal, you will receive your results only by: MyChart Message (if you have MyChart) OR A paper copy in the mail If you have any lab test that is abnormal or we need to change your treatment, we will call you to review the results.   Testing/Procedures: Your physician has requested that you have a lexiscan  myoview . For further information please visit [email or web address] Please follow instruction sheet, as given.    Follow-Up: At [facility], you and your health needs are our priority.  As part of our continuing mission to provide you with exceptional heart care, we have created designated Provider Care Teams.  These Care Teams include your primary Cardiologist (physician) and Advanced Practice Providers (APPs -  Physician Assistants and Nurse Practitioners) who all work together to provide you with the care you need, when you need it.  We recommend signing up for the patient portal called "MyChart".  Sign up information is provided on this After Visit Summary.  MyChart is used to connect with patients for Virtual Visits (Telemedicine).  Patients are able to view lab/test results, encounter notes, upcoming appointments, etc.  Non-urgent messages can be sent to your provider as well.   To learn more about what you can do with MyChart, go to [email or web address].    Your next appointment:   6 month(s)  Provider:   Dorothye Gathers, MD     Other Instructions You have been referred to Dr Venice Gillis --[facility]

## 2023-12-08 NOTE — Progress Notes (Signed)
 Cardiology Office Note:  .   Date:  12/08/2023  ID:  Kristen Weber, DOB [birth year], MRN [id number] PCP: Kristen Nim, NP  Kristen Weber Providers Cardiologist:  Kristen Gathers, MD   History of Present Illness: Kristen Weber   Kristen Weber is a 89 y.o. 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  Discussed the use of AI scribe software for clinical note transcription with the patient, who gave verbal consent to proceed.  History of Present Illness   The patient, with a history of diabetes and peripheral vascular disease, presents with worsening shortness of breath and difficulty swallowing. She reports that she becomes 'huffing and puffing' after taking a bath, a symptom that has been ongoing for a while but has worsened over the past six months. She also experiences chest tightness when moving around the house.  The patient has been experiencing difficulty swallowing for a long time, but it has recently worsened. She describes a sensation of food getting stuck in her chest, which eases after sipping water.  The patient has been using a CPAP machine for sleep and reports improved sleep quality since starting the therapy. She denies waking up in the middle of the night feeling breathless.  The patient also reports shoulder and knee pain, for which she is scheduled to start therapy.      Labs   Lab Results  Component Value Date   NA 141 12/08/2022   K 3.9 12/08/2022   CO2 28 12/08/2022   GLUCOSE 134 (H) 12/08/2022   BUN 14 12/08/2022   CREATININE 1.30 (H) 03/15/2023   CALCIUM  11.1 (H) 12/08/2022   GFR  45.09 (L) 12/08/2022   GFRNONAA 49 (L) 09/27/2015      Latest Ref Rng & Units 03/15/2023    9:41 AM 12/08/2022   10:37 AM 04/30/2022   11:03 AM  BMP  Glucose 70 - 99 mg/dL  409  811   BUN 6 - 23 mg/dL  14  14   Creatinine 9.14 - 1.00 mg/dL 7.82  9.56  2.13   Sodium 135 - 145 mEq/L  141  141   Potassium 3.5 - 5.1 mEq/L  3.9  3.4   Chloride 96 - 112 mEq/L  106  107   CO2 19 - 32 mEq/L  28  28   Calcium  8.4 - 10.5 mg/dL  08.6  57.8       Latest Ref Rng & Units 01/08/2022   12:03 PM 09/27/2015   11:59 AM 02/12/2015    5:28 AM  CBC  WBC 4.0 - 10.5 K/uL 4.0  3.0  3.2   Hemoglobin 12.0 - 15.0 g/dL 46.9  62.9  52.8   Hematocrit 36.0 - 46.0 % 40.1  40.4  38.6   Platelets 150.0 - 400.0 K/uL 151.0  156  157    External Labs:  Labs 05/12/2022:  Total cholesterol 130, triglycerides 100, HDL 49, LDL 61.  A1c 5.6%.  TSH normal at 2.080.  Review of Systems  Cardiovascular:  Positive for chest pain and dyspnea on exertion. Negative for leg swelling.  Gastrointestinal:  Positive for dysphagia and heartburn.    Physical Exam:   VS:  BP  102/71 (BP Location: Left Arm, Patient Position: Sitting, Cuff Size: Normal)   Pulse 79   Resp 16   Ht 5\' 3"  (1.6 m)   Wt 189 lb 6.4 oz (85.9 kg)   SpO2 95%   BMI 33.55 kg/m    Wt Readings from Last 3 Encounters:  12/08/23 189 lb 6.4 oz (85.9 kg)  09/01/23 185 lb 3.2 oz (84 kg)  06/15/23 184 lb (83.5 kg)  Physical Exam Neck:     Vascular: No carotid bruit or JVD.  Cardiovascular:     Rate and Rhythm: Normal rate and regular rhythm.     Pulses:          Dorsalis pedis pulses are 0 on the right side and 0 on the left side.       Posterior tibial pulses are 0 on the right side and 0 on the left side.     Heart sounds: Normal heart sounds. No murmur heard.    No gallop.  Pulmonary:     Effort: Pulmonary effort is normal.     Breath sounds: Examination of the right-lower field reveals rhonchi. Examination of the left-lower field reveals rhonchi.  Rhonchi present.  Abdominal:     General: Bowel sounds are normal.     Palpations: Abdomen is soft.  Musculoskeletal:     Right lower leg: No edema.     Left lower leg: No edema.  Skin:    Capillary Refill: Capillary refill takes less than 2 seconds.    Studies Reviewed: Kristen Weber    ABI 08/13/2022:  Right: Resting right ankle-brachial index indicates mild right lower  extremity arterial disease.  The right toe-brachial index is normal.   Left: Resting left ankle-brachial index is within normal range. The left  toe-brachial index is normal.   EKG:    EKG Interpretation Date/Time:  Wednesday December 08 2023 15:28:13 EST Ventricular Rate:  79 PR Interval:  170 QRS Duration:  86 QT Interval:  374 QTC Calculation: 428 R Axis:   -35  Text Interpretation: EKG 12/08/2023: Normal sinus rhythm at rate of 79 bpm, leftward axis, incomplete right bundle branch block.  Poor R progression, cannot exclude anteroseptal infarct old.  Low-voltage complexes.  Pulmonary disease pattern.  PVCs (2) compared to 09/01/2023, no significant change. Confirmed by Kristen Weber, Kristen Weber ([id number]) on 12/08/2023 3:38:16 PM    Medications and allergies    Allergies  Allergen Reactions   Lisinopril Cough    cough    Current Outpatient Medications:    aspirin  EC 81 MG tablet, Take 1 tablet (81 mg total) by mouth at bedtime., Disp: 90 tablet, Rfl: 3   atorvastatin  (LIPITOR) 20 MG tablet, TAKE ONE TABLET BY MOUTH EVERY MORNING, Disp: 90 tablet, Rfl: 3   Cholecalciferol (VITAMIN D3 PO), Take 850 mg by mouth 2 (two) times daily., Disp: , Rfl:    Continuous Blood Gluc Receiver (FREESTYLE LIBRE 2 READER) DEVI, 1 Device by Does not apply route as directed., Disp: 1 each, Rfl: 0   diltiazem  (CARDIZEM  CD) 120 MG 24 hr capsule, TAKE ONE CAPSULE BY MOUTH AT BEDTIME, Disp: 90 capsule, Rfl: 3   insulin  glargine (LANTUS  SOLOSTAR) 100 UNIT/ML Solostar Pen, Inject 12 Units into the skin daily., Disp: 15 mL, Rfl: 1   Insulin  Pen Needle  32G X 4 MM MISC, 1 Device by Does not apply route in the morning, at noon, in the evening, and at bedtime., Disp: 150 each, Rfl: 6   isosorbide  mononitrate (IMDUR ) 60 MG 24  hr tablet, TAKE ONE TABLET BY MOUTH EVERY MORNING, Disp: 90 tablet, Rfl: 3   loratadine (CLARITIN) 10 MG tablet, Take 10 mg by mouth daily., Disp: , Rfl:    losartan  (COZAAR ) 25 MG tablet, TAKE ONE TABLET BY MOUTH AT BEDTIME, Disp: 90 tablet, Rfl: 3   pantoprazole  (PROTONIX ) 20 MG tablet, Take 1 tablet (20 mg total) by mouth daily. Take 30 minutes before breakfast, Disp: 30 tablet, Rfl: 5   topiramate  (TOPAMAX ) 25 MG tablet, Take 1 tablet (25 mg total) by mouth at bedtime., Disp: 90 tablet, Rfl: 1   traMADol  (ULTRAM ) 50 MG tablet, Take 100 mg by mouth daily as needed., Disp: , Rfl:    nitroGLYCERIN  (NITROSTAT ) 0.4 MG SL tablet, Place 1 tablet (0.4 mg total) under the tongue every 5 (five) minutes as needed., Disp: 25 tablet, Rfl: 2   ASSESSMENT AND PLAN: .      ICD-10-CM   1. Dyspnea on exertion  R06.09 EKG 12-Lead    MYOCARDIAL PERFUSION IMAGING    Cardiac Stress Test: Informed Consent Details: Physician/Practitioner Attestation; Transcribe to consent form and obtain patient signature    2. Coronary artery disease of native artery of native heart with stable angina pectoris (HCC)  I25.118 nitroGLYCERIN  (NITROSTAT ) 0.4 MG SL tablet    MYOCARDIAL PERFUSION IMAGING    Cardiac Stress Test: Informed Consent Details: Physician/Practitioner Attestation; Transcribe to consent form and obtain patient signature    3. PAD (peripheral artery disease) (HCC)  I73.9     4. Esophageal stricture  K22.2 Ambulatory referral to Gastroenterology      Assessment and Plan  Exertional Dyspnea and Chest Tightness Worsening over the past six months. Possible cardiac etiology, probably has underlying coronary artery disease with stable angina pectoris given history of peripheral vascular disease and diabetes. -Schedule a pharmacologic stress  test. -If stress test is positive, consider cardiac catheterization.  Although 89 years of age fairly active.  Will try to avoid any invasive procedures as much as possible unless the stress test is high risk. -Presently on appropriate medical therapy including 2 vasodilatory agents, diltiazem  CD and Imdur , also on losartan  and statin and aspirin . -Prescribe nitroglycerin  sublingual tablets for chest discomfort, instruct patient to replace every six months.  Dysphagia Longstanding issue, worsening recently. Likely due to esophageal stricture. Food possibly aspirating into lungs, contributing to shortness of breath. -Refer to Dr. Venice Gillis at [facility] for GI consultation and possible esophageal dilation.  Sleep Apnea Improved with consistent CPAP use. -Continue CPAP as prescribed.  Follow-up Schedule appointment with Dr. Dorothye Weber in six months, or sooner if stress test is abnormal.        Informed Consent   Shared Decision Making/Informed Consent The risks [chest pain, shortness of breath, cardiac arrhythmias, dizziness, blood pressure fluctuations, myocardial infarction, stroke/transient ischemic attack, nausea, vomiting, allergic reaction, radiation exposure, metallic taste sensation and life-threatening complications (estimated to be 1 in 10,000)], benefits (risk stratification, diagnosing coronary artery disease, treatment guidance) and alternatives of a nuclear stress test were discussed in detail with Ms. Ruplinger and she agrees to proceed.      Signed,  Knox Perl, MD, [facility] 12/08/2023, 8:40 PM [facility] [address] [place], [place] [address] Phone: [phone]. Fax:  [phone]

## 2023-12-10 ENCOUNTER — Telehealth: Payer: Self-pay | Admitting: Gastroenterology

## 2023-12-10 NOTE — Telephone Encounter (Signed)
Pt daughter Steward Drone  stated that pt recently seen the cardiologist for SOB. Cardiologist notified them that the pt food is "not going down and drpping into the lungs. Pt to have stress test on Monday.  Pt was scheduled to see Hyacinth Meeker PA on 12/16/2023 at 8:30. Steward Drone made aware. Steward Drone verbalized understanding with all questions answered.

## 2023-12-10 NOTE — Telephone Encounter (Signed)
PT daughter would like to discuss PT's symptoms and if she should maybe come in to discuss having an EGD due to her choking on food. Please advise.

## 2023-12-13 ENCOUNTER — Telehealth: Payer: Self-pay | Admitting: Cardiology

## 2023-12-13 NOTE — Telephone Encounter (Signed)
Daughter calling for instructions for Stress Test tomorrow.

## 2023-12-13 NOTE — Telephone Encounter (Signed)
Sent patient the Stress Test instructions via MyChart message. Called patient's daughter (per DPR) to let her know. While on the phone she was able to pull up the instructions and I went over them with her.

## 2023-12-14 ENCOUNTER — Encounter: Payer: Self-pay | Admitting: Cardiology

## 2023-12-14 ENCOUNTER — Ambulatory Visit (HOSPITAL_COMMUNITY): Payer: 59 | Attending: Cardiology

## 2023-12-14 DIAGNOSIS — I25118 Atherosclerotic heart disease of native coronary artery with other forms of angina pectoris: Secondary | ICD-10-CM | POA: Diagnosis present

## 2023-12-14 DIAGNOSIS — R0609 Other forms of dyspnea: Secondary | ICD-10-CM

## 2023-12-14 LAB — MYOCARDIAL PERFUSION IMAGING
Base ST Depression (mm): 0 mm
Estimated workload: 1
Exercise duration (min): 1 min
Exercise duration (sec): 0 s
LV dias vol: 57 mL (ref 46–106)
LV sys vol: 31 mL
MPHR: 133 {beats}/min
Nuc Stress EF: 46 %
Peak HR: 102 {beats}/min
Percent HR: 76 %
Rest HR: 75 {beats}/min
Rest Nuclear Isotope Dose: 10.6 mCi
SDS: 2
SRS: 0
SSS: 2
ST Depression (mm): 0 mm
Stress Nuclear Isotope Dose: 30.4 mCi
TID: 0.88

## 2023-12-14 MED ORDER — TECHNETIUM TC 99M TETROFOSMIN IV KIT
10.6000 | PACK | Freq: Once | INTRAVENOUS | Status: AC | PRN
  Administered 2023-12-14: 10.6 via INTRAVENOUS

## 2023-12-14 MED ORDER — TECHNETIUM TC 99M TETROFOSMIN IV KIT
30.4000 | PACK | Freq: Once | INTRAVENOUS | Status: AC | PRN
Start: 2023-12-14 — End: 2023-12-14
  Administered 2023-12-14: 30.4 via INTRAVENOUS

## 2023-12-14 MED ORDER — REGADENOSON 0.4 MG/5ML IV SOLN
0.4000 mg | Freq: Once | INTRAVENOUS | Status: AC
  Administered 2023-12-14: 0.4 mg via INTRAVENOUS

## 2023-12-14 NOTE — Progress Notes (Signed)
Stress test does not appear to show any significant blockages, low risk stress test.

## 2023-12-16 ENCOUNTER — Encounter: Payer: Self-pay | Admitting: Gastroenterology

## 2023-12-16 ENCOUNTER — Ambulatory Visit: Payer: 59 | Admitting: Physician Assistant

## 2023-12-16 ENCOUNTER — Ambulatory Visit (INDEPENDENT_AMBULATORY_CARE_PROVIDER_SITE_OTHER): Payer: 59 | Admitting: Gastroenterology

## 2023-12-16 VITALS — BP 110/74 | HR 96 | Ht 63.0 in | Wt 186.0 lb

## 2023-12-16 DIAGNOSIS — R1310 Dysphagia, unspecified: Secondary | ICD-10-CM | POA: Diagnosis not present

## 2023-12-16 NOTE — Patient Instructions (Signed)
You have been scheduled for a Barium Esophogram at [facility] (1st floor of the hospital) on Friday 12/24/23 at 10 am. Please arrive 30 minutes prior to your appointment for registration. Make certain not to have anything to eat or drink 3 hours prior to your test. If you need to reschedule for any reason, please contact radiology at [phone] to do so. __________________________________________________________________ A barium swallow is an examination that concentrates on views of the esophagus. This tends to be a double contrast exam (barium and two liquids which, when combined, create a gas to distend the wall of the oesophagus) or single contrast (non-ionic iodine based). The study is usually tailored to your symptoms so a good history is essential. Attention is paid during the study to the form, structure and configuration of the esophagus, looking for functional disorders (such as aspiration, dysphagia, achalasia, motility and reflux) EXAMINATION You may be asked to change into a gown, depending on the type of swallow being performed. A radiologist and radiographer will perform the procedure. The radiologist will advise you of the type of contrast selected for your procedure and direct you during the exam. You will be asked to stand, sit or lie in several different positions and to hold a small amount of fluid in your mouth before being asked to swallow while the imaging is performed .In some instances you may be asked to swallow barium coated marshmallows to assess the motility of a solid food bolus. The exam can be recorded as a digital or video fluoroscopy procedure. POST PROCEDURE It will take 1-2 days for the barium to pass through your system. To facilitate this, it is important, unless otherwise directed, to increase your fluids for the next 24-48hrs and to resume your normal diet.  This test typically takes about 30 minutes to  perform. ________________________________________________________________________  Kristen Weber have been scheduled for an endoscopy. Please follow written instructions given to you at your visit today.  If you use inhalers (even only as needed), please bring them with you on the day of your procedure.  If you take any of the following medications, they will need to be adjusted prior to your procedure:   DO NOT TAKE 7 DAYS PRIOR TO TEST- Trulicity (dulaglutide) Ozempic, Wegovy (semaglutide) Mounjaro (tirzepatide) Bydureon Bcise (exanatide extended release)  DO NOT TAKE 1 DAY PRIOR TO YOUR TEST Rybelsus (semaglutide) Adlyxin (lixisenatide) Victoza (liraglutide) Byetta (exanatide) ___________________________________________________________________________

## 2023-12-16 NOTE — Progress Notes (Signed)
12/16/2023 Kristen Weber [id number] 08-29-1934   HISTORY OF PRESENT ILLNESS: This is an 89 year old female who is a patient of Dr. Urban Gibson.  She is here today with complaints of dysphagia.  She tells me that she is having trouble swallowing pills and points to the area of her throat.  She tells me that she does not have any issues swallowing food at all.  However, her daughter tells me that for the most part she eats just fine, but she said it does seem that when she eats foods that are harder to go down that sometimes she will have some issues.  Soft foods go down just fine.  Her daughter tells me that these are the same symptoms that she had before she had her esophagus stretched in the past.  Patient denies any issues with coughing/choking/sputtering when drinking liquids.  EGD 03/2021:  - Non- obstructing and moderate distal esophageal ring. Dilated. - 2 cm hiatal hernia. - Gastritis. Biopsied.  Surgical [P], gastric antrum and gastric body - REACTIVE GASTROPATHY WITH ATYPIA AND INTESTINAL METAPLASIA - NO DYSPLASIA OR H. PYLORI IDENTIFIED - SEE COMMENT.  Esophagram 03/2021:  IMPRESSION: 1. Distal esophageal mucosal ring measures at most 0.9 cm in diameter. Rings of this size are expected to cause symptoms, and dilation should be considered. 2. Small type 1 hiatal hernia. 3. Occasional secondary and tertiary contractions in the esophagus although primary peristaltic waves are not disrupted. 4. The patient has some limited oral control and was unable to swallow the pill. Speech pathology assessment should be considered.   Past Medical History:  Diagnosis Date   Abnormal cardiovascular stress test 09/20/2013   Possibly abnormal nuclear stress at [facility]-anteroseptal abnormality interpreted as possible breast attenuation as well, motion artifact. Normal EF. No TID. Watching medically   Allergy    Anxiety    Cataract    Diabetes (HCC) 09/20/2013   Diabetes mellitus (HCC)     Type 2   Essential hypertension 09/20/2013   GERD (gastroesophageal reflux disease)    H/O: hysterectomy    Hyperlipidemia 09/20/2013   RA (rheumatoid arthritis) (HCC)    Sleep apnea    uses cpap   Thyroid disease    Thyroid Nodule   Past Surgical History:  Procedure Laterality Date   CARDIAC CATHETERIZATION  02/12/2015   LEFT HEART CATHETERIZATION WITH CORONARY ANGIOGRAM N/A 02/12/2015   Procedure: LEFT HEART CATHETERIZATION WITH CORONARY ANGIOGRAM;  Surgeon: Lennette Bihari, MD;  Location: [facility] CATH LAB;  Service: Cardiovascular;  Laterality: N/A;   TOTAL ABDOMINAL HYSTERECTOMY      reports that she has never smoked. Her smokeless tobacco use includes snuff. She reports that she does not drink alcohol and does not use drugs. family history includes Diabetes in her mother. Allergies  Allergen Reactions   Lisinopril Cough    cough      Outpatient Encounter Medications as of 12/16/2023  Medication Sig   aspirin EC 81 MG tablet Take 1 tablet (81 mg total) by mouth at bedtime.   atorvastatin (LIPITOR) 20 MG tablet TAKE ONE TABLET BY MOUTH EVERY MORNING   cetirizine (ZYRTEC) 10 MG chewable tablet Chew 10 mg by mouth daily.   Cholecalciferol (VITAMIN D3 PO) Take 850 mg by mouth 2 (two) times daily.   Continuous Blood Gluc Receiver (FREESTYLE LIBRE 2 READER) DEVI 1 Device by Does not apply route as directed.   diltiazem (CARDIZEM CD) 120 MG 24 hr capsule TAKE ONE CAPSULE BY MOUTH AT BEDTIME  insulin glargine (LANTUS SOLOSTAR) 100 UNIT/ML Solostar Pen Inject 12 Units into the skin daily.   Insulin Pen Needle 32G X 4 MM MISC 1 Device by Does not apply route in the morning, at noon, in the evening, and at bedtime.   isosorbide mononitrate (IMDUR) 60 MG 24 hr tablet TAKE ONE TABLET BY MOUTH EVERY MORNING   losartan (COZAAR) 25 MG tablet TAKE ONE TABLET BY MOUTH AT BEDTIME   pantoprazole (PROTONIX) 20 MG tablet Take 1 tablet (20 mg total) by mouth daily. Take 30 minutes before breakfast    topiramate (TOPAMAX) 25 MG tablet Take 1 tablet (25 mg total) by mouth at bedtime.   traMADol (ULTRAM) 50 MG tablet Take 100 mg by mouth daily as needed.   nitroGLYCERIN (NITROSTAT) 0.4 MG SL tablet Place 1 tablet (0.4 mg total) under the tongue every 5 (five) minutes as needed. (Patient not taking: Reported on 12/16/2023)   [DISCONTINUED] loratadine (CLARITIN) 10 MG tablet Take 10 mg by mouth daily.   No facility-administered encounter medications on file as of 12/16/2023.     REVIEW OF SYSTEMS  : All other systems reviewed and negative except where noted in the History of Present Illness.   PHYSICAL EXAM: BP 110/74 (BP Location: Left Arm, Patient Position: Sitting, Cuff Size: Normal)   Pulse 96   Ht 5\' 3"  (1.6 m)   Wt 186 lb (84.4 kg)   BMI 32.95 kg/m  General: Well developed female in no acute distress Head: Normocephalic and atraumatic Eyes:  Sclerae anicteric, conjunctiva pink. Ears: Normal auditory acuity Lungs: Clear throughout to auscultation; no W/R/R. Heart: Regular rate and rhythm; no M/R/G. Musculoskeletal: Symmetrical with no gross deformities  Skin: No lesions on visible extremities Extremities: No edema  Neurological: Alert oriented x 4, grossly non-focal Psychological:  Alert and cooperative. Normal mood and affect  ASSESSMENT AND PLAN: *Dysphagia: Patient describes issues with swallowing pills in the location of the oropharynx.  Rarely any issues with swallowing foods, sometimes foods that are harder to swallow go down a little bit more difficult.  Sounds like she needs a modified barium swallow study with speech, but her daughter reports that these are the same symptoms that she had when she had her esophagus stretched with Schatzki's ring in the past.  That was last done in May 2022.  Will check an esophagram first to see if that shows a tight Schatzki's ring again then we can consider repeating dilation, but otherwise she may need modified barium swallow study with  speech to evaluate oropharyngeal swallowing.  There was note of "limited oral control" and mention of speech pathology assessment being considered on her esophagram in 2022.   CC:  Kristen Greathouse, NP

## 2023-12-21 ENCOUNTER — Ambulatory Visit (INDEPENDENT_AMBULATORY_CARE_PROVIDER_SITE_OTHER): Payer: 59 | Admitting: Internal Medicine

## 2023-12-21 VITALS — BP 126/82 | HR 65 | Ht 63.0 in | Wt 186.0 lb

## 2023-12-21 DIAGNOSIS — E1159 Type 2 diabetes mellitus with other circulatory complications: Secondary | ICD-10-CM

## 2023-12-21 DIAGNOSIS — R809 Proteinuria, unspecified: Secondary | ICD-10-CM

## 2023-12-21 DIAGNOSIS — E1169 Type 2 diabetes mellitus with other specified complication: Secondary | ICD-10-CM | POA: Diagnosis not present

## 2023-12-21 DIAGNOSIS — E210 Primary hyperparathyroidism: Secondary | ICD-10-CM | POA: Diagnosis not present

## 2023-12-21 DIAGNOSIS — E1142 Type 2 diabetes mellitus with diabetic polyneuropathy: Secondary | ICD-10-CM | POA: Diagnosis not present

## 2023-12-21 DIAGNOSIS — Z794 Long term (current) use of insulin: Secondary | ICD-10-CM

## 2023-12-21 LAB — POCT GLYCOSYLATED HEMOGLOBIN (HGB A1C): Hemoglobin A1C: 7.3 % — AB (ref 4.0–5.6)

## 2023-12-21 MED ORDER — INSULIN PEN NEEDLE 32G X 4 MM MISC: 1.0000 | Freq: Four times a day (QID) | 6 refills | Status: AC

## 2023-12-21 MED ORDER — INSULIN LISPRO (1 UNIT DIAL) 100 UNIT/ML (KWIKPEN): PEN_INJECTOR | SUBCUTANEOUS | 11 refills | Status: AC

## 2023-12-21 MED ORDER — LOSARTAN POTASSIUM 50 MG PO TABS: 50.0000 mg | ORAL_TABLET | Freq: Every day | ORAL | 3 refills | Status: AC

## 2023-12-21 MED ORDER — LANTUS SOLOSTAR 100 UNIT/ML ~~LOC~~ SOPN: 12.0000 [IU] | PEN_INJECTOR | Freq: Every day | SUBCUTANEOUS | 1 refills | Status: AC

## 2023-12-21 NOTE — Progress Notes (Signed)
Name: Kristen Weber  Age/ Sex: 89 y.o., female   MRN/ DOB: [id number], [birth year]     PCP: Chilton Greathouse, NP   Reason for Endocrinology Evaluation: Type 2 Diabetes Mellitus  Initial Endocrine Consultative Visit: 01/03/2021    PATIENT IDENTIFIER: [name] is a 89 y.o. female with a past medical history of T2DM, CAD, HTN and Dyslipidemia. The patient has followed with Endocrinology clinic since 01/03/2021 for consultative assistance with management of her diabetes.  DIABETIC HISTORY:  Kristen Weber was diagnosed with DM yrs ago,Metformin-ineffective. Her hemoglobin A1c has ranged from 6.6% in 2016, peaking at 7.9% in 2015.  On her initial visit to our clinic she had an  A1c 6.1%, pt with recurrent hypoglycemia, she was on basal insulin and Glipizide . We stopped Glipizide, reduced basal insulin and offered add-on therapy ( GLP-1 agonist vs prandial insulin) due to low GFR of 38 . Opted for prandial insulin     MNG/PARATHYROID HISTORY:  Patient has been following up with Dr. Sharl Ma for multinodular goiter and hypercalcemia for many years. She has no history of thyroid nodule FNAs in the past   SUBJECTIVE:   During the last visit (06/15/2023): A1c 7.0 %.     Today (12/21/2023): Kristen Weber is here for a follow up on diabetes management.  She is accompanied by her daughter. She checks her blood sugars 3 times daily through freestyle libre. The patient has had hypoglycemic episodes since the last clinic visit.    She was evaluated by GI 11/2023 for dysphagia She was evaluated by cardiology 11/2023 for PVD, CAD, HTN and dyslipidemia-managed medically She continues to follow-up with vascular and vein specialist for left internal iliac artery aneurysm Follows with neurology for essential tremors    Denies local neck swelling  Denies nausea, vomiting Continues with chronic  constipation , no diarrhea  Denies renal stones     HOME DIABETES REGIMEN:  Lantus 12 units daily  CF:  HUmalog (BG-130/ 40) Vitamin D 2000 iu daily    Statin: yes ACE-I/ARB: yes Prior Diabetic Education: yes   CONTINUOUS GLUCOSE MONITORING RECORD INTERPRETATION    Dates of Recording:1/15-1/28/2025  Sensor description:dexcom  Results statistics:   CGM use % of time 72  Average and SD 170/24.8  Time in range 63%  % Time Above 180 33  % Time above 250 4  % Time Below target 0    Glycemic patterns summary: Bg's optima through the night and fluctuate during the day  Hyperglycemic episodes  postprandial  Hypoglycemic episodes occurred N/A  Overnight periods: Optimal      DIABETIC COMPLICATIONS: Microvascular complications:  Neuropathy, CKD Denies:  retinopathy Last Eye Exam: Completed 12/2020  Macrovascular complications:  CAD, CVA Denies:  PVD   HISTORY:  Past Medical History:  Past Medical History:  Diagnosis Date   Abnormal cardiovascular stress test 09/20/2013   Possibly abnormal nuclear stress at [place]-anteroseptal abnormality interpreted as possible breast attenuation as well, motion artifact. Normal EF. No TID. Watching medically   Allergy    Anxiety    Cataract    Diabetes (HCC) 09/20/2013   Diabetes mellitus (HCC)    Type 2   Essential hypertension 09/20/2013   GERD (gastroesophageal reflux disease)    H/O: hysterectomy    Hyperlipidemia 09/20/2013   RA (rheumatoid arthritis) (HCC)    Sleep apnea    uses cpap   Thyroid disease    Thyroid Nodule   Past Surgical History:  Past Surgical History:  Procedure Laterality Date  CARDIAC CATHETERIZATION  02/12/2015   LEFT HEART CATHETERIZATION WITH CORONARY ANGIOGRAM N/A 02/12/2015   Procedure: LEFT HEART CATHETERIZATION WITH CORONARY ANGIOGRAM;  Surgeon: Lennette Bihari, MD;  Location: [facility] CATH LAB;  Service: Cardiovascular;  Laterality: N/A;   TOTAL ABDOMINAL HYSTERECTOMY     Social History:  reports that she has never smoked. Her smokeless tobacco use includes snuff. She reports that she does  not drink alcohol and does not use drugs. Family History:  Family History  Problem Relation Age of Onset   Diabetes Mother    Colon cancer Neg Hx    Prostate cancer Neg Hx    Rectal cancer Neg Hx    Stomach cancer Neg Hx      HOME MEDICATIONS: Allergies as of 12/21/2023       Reactions   Lisinopril Cough   cough        Medication List        Accurate as of December 21, 2023 10:22 AM. If you have any questions, ask your nurse or doctor.          aspirin EC 81 MG tablet Take 1 tablet (81 mg total) by mouth at bedtime.   atorvastatin 20 MG tablet Commonly known as: LIPITOR TAKE ONE TABLET BY MOUTH EVERY MORNING   cetirizine 10 MG chewable tablet Commonly known as: ZYRTEC Chew 10 mg by mouth daily.   diltiazem 120 MG 24 hr capsule Commonly known as: CARDIZEM CD TAKE ONE CAPSULE BY MOUTH AT BEDTIME   FreeStyle Libre 2 Reader Devi 1 Device by Does not apply route as directed.   Insulin Pen Needle 32G X 4 MM Misc 1 Device by Does not apply route in the morning, at noon, in the evening, and at bedtime.   isosorbide mononitrate 60 MG 24 hr tablet Commonly known as: IMDUR TAKE ONE TABLET BY MOUTH EVERY MORNING   Lantus SoloStar 100 UNIT/ML Solostar Pen Generic drug: insulin glargine Inject 12 Units into the skin daily.   losartan 25 MG tablet Commonly known as: COZAAR TAKE ONE TABLET BY MOUTH AT BEDTIME   nitroGLYCERIN 0.4 MG SL tablet Commonly known as: Nitrostat Place 1 tablet (0.4 mg total) under the tongue every 5 (five) minutes as needed.   pantoprazole 20 MG tablet Commonly known as: Protonix Take 1 tablet (20 mg total) by mouth daily. Take 30 minutes before breakfast   topiramate 25 MG tablet Commonly known as: TOPAMAX Take 1 tablet (25 mg total) by mouth at bedtime.   traMADol 50 MG tablet Commonly known as: ULTRAM Take 100 mg by mouth daily as needed.   VITAMIN D3 PO Take 850 mg by mouth 2 (two) times daily.         OBJECTIVE:    Vital Signs: BP 126/82 (BP Location: Left Arm, Patient Position: Sitting, Cuff Size: Normal)   Pulse 65   Ht 5\' 3"  (1.6 m)   Wt 186 lb (84.4 kg)   SpO2 96%   BMI 32.95 kg/m   Wt Readings from Last 3 Encounters:  12/21/23 186 lb (84.4 kg)  12/16/23 186 lb (84.4 kg)  12/14/23 189 lb (85.7 kg)     Exam: General: Pt appears well and is in NAD Right thyroid asymmetry  appreciated  Lungs: Clear with good BS bilat   Heart: RRR   Extremities: No pretibial edema.   Neuro: MS is good with appropriate affect, pt is alert and Ox3    DM Foot Exam 12/21/2023  The skin of the  feet is intact without sores or ulcerations. The pedal pulses are 2+ on right and 2+ on left. The sensation is intact to a screening 5.07, 10 gram monofilament bilaterally    DATA REVIEWED:  Lab Results  Component Value Date   HGBA1C 7.0 (A) 12/08/2022   HGBA1C 7.0 (A) 04/30/2022   HGBA1C 7.8 (A) 10/10/2021        11/30/2023  BUN 17 CR 1.1 GFR 49 Ca 10.8 Alb 4.1 TG 179 HDL 72 LDL 52 Alb/cr ratio 78.4   Thyroid Ultrasound 03/08/2021 Nodule # 1:  Prior biopsy: No  Location: Right; inferior  Maximum size: 1.1 cm; Other 2 dimensions: 0.8 x 1.0 cm, previously, 1.2 x 1.0 x 1.0 cm  Composition: solid/almost completely solid (2)  Echogenicity: isoechoic (1)  Shape: not taller-than-wide (0)  Margins: ill-defined (0)  Echogenic foci: none (0)  ACR TI-RADS total points: 3.  ACR TI-RADS risk category:  TR3 (3 points).  Significant change in size (>/= 20% in two dimensions and minimal increase of 2 mm): No  Change in features: No  Change in ACR TI-RADS risk category: No  ACR TI-RADS recommendations:  Given size (<1.4 cm) and appearance, this nodule does NOT meet TI-RADS criteria for biopsy or dedicated follow-up.  _________________________________________________________  Subcentimeter left thyroid nodule does not meet criteria for FNA or imaging follow-up. Procedure Note  Mir,  Jeannine Kitten, MD - 03/11/2021 Formatting of this note might be different from the original. CLINICAL DATA:  Difficulty swallowing pills  EXAM: THYROID ULTRASOUND  TECHNIQUE: Ultrasound examination of the thyroid gland and adjacent soft tissues was performed.  COMPARISON:  Ultrasound neck 01/13/2019  FINDINGS: Parenchymal Echotexture: Mildly heterogeneous  Isthmus: 0.2 cm  Right lobe: 5.0 x 1.7 x 1.5 cm  Left lobe: 3.0 x 1.2 x 1.4 cm  _________________________________________________________  Estimated total number of nodules >/= 1 cm: 1  Number of spongiform nodules >/=  2 cm not described below (TR1): 0  Number of mixed cystic and solid nodules >/= 1.5 cm not described below (TR2): 0  _________________________________________________________  Nodule # 1:  Prior biopsy: No  Location: Right; inferior  Maximum size: 1.1 cm; Other 2 dimensions: 0.8 x 1.0 cm, previously, 1.2 x 1.0 x 1.0 cm  Composition: solid/almost completely solid (2)  Echogenicity: isoechoic (1)  Shape: not taller-than-wide (0)  Margins: ill-defined (0)  Echogenic foci: none (0)  ACR TI-RADS total points: 3.  ACR TI-RADS risk category:  TR3 (3 points).  Significant change in size (>/= 20% in two dimensions and minimal increase of 2 mm): No  Change in features: No  Change in ACR TI-RADS risk category: No  ACR TI-RADS recommendations:  Given size (<1.4 cm) and appearance, this nodule does NOT meet TI-RADS criteria for biopsy or dedicated follow-up.  _________________________________________________________  Subcentimeter left thyroid nodule does not meet criteria for FNA or imaging follow-up.  IMPRESSION: No suspicious thyroid nodules.    Old records , labs and images have been reviewed.    ASSESSMENT / PLAN / RECOMMENDATIONS:   1) Type 2 Diabetes Mellitus, optimally controlled, With neuropathic, CKD III and macrovascular complications - Most recent A1c of 7.3 %. Goal  A1c <7.5%.    -A1c at goal, but is trending up  -They had declines GLP-1 agonists in the past  -Hypoglycemia resolved overnight -I have changed her target glucose reading from 140 to 130 mg/dL, so that way she will receive Humalog at a lower level to prevent severe hypoglycemia postprandially   MEDICATIONS: -Continue  Lantus 12 units daily  -Correction factor : Humalog (BG -140/40) TIDQAC  EDUCATION / INSTRUCTIONS: BG monitoring instructions: Patient is instructed to check her blood sugars 3 times a day, before meals. Call [facility] [facility] if: BG persistently < 70  I reviewed the Rule of 15 for the treatment of hypoglycemia in detail with the patient. Literature supplied.   2) Diabetic complications:  Eye: Does not have known diabetic retinopathy.  Neuro/ Feet: Does have known diabetic peripheral neuropathy .  Renal: Patient does  have known baseline CKD. She   is on an ACEI/ARB at present.    3) MNG:   -No local neck symptoms -No prior history of FNA -Per [facility] ultrasound dated 4/60/2022 none of the nodule met criteria for follow-up -I did offer for repeat ultrasound as the daughter mentioned multinodular goiter, but patient opted to hold off on this   4) Hyperparathyroidism:  -Most likely the patient will be managed medically due to advanced age - GFR low , we will proceed with 24-hour urine collection -I have attempted to order DXA scan but per daughter this has been done by previous endocrinologist not long ago  5) Microalbuminuria:  -I have recommended increasing losartan as below -Patient is concerned about low BP, I did advise the daughter that if hypotension ensues she may contact cardiology to decrease Cardizem  Medication Increase losartan 50 mg daily    F/U in 6 months  Signed electronically by: Lyndle Herrlich, MD  [facility]  [facility] [address] [place] [place], [place]  [address] Phone: [phone] FAX: [phone]   CC: Chilton Greathouse, NP [address] [place] [address] Phone: [phone]  Fax: [phone]  Return to Endocrinology clinic as below: Future Appointments  Date Time Provider Department Center  12/21/2023 10:30 AM Kristen Weber, Konrad Dolores, MD [facility] None  12/24/2023 10:00 AM [facility] R/F 1 [facility] [place]  03/03/2024  9:00 AM Butch Penny, NP [facility] None

## 2023-12-21 NOTE — Patient Instructions (Addendum)
-   Lantus 12 units daily  -Humalog correctional insulin: Use the scale below to help guide you before each meal   Blood sugar before meal Number of units to inject  Less than 170 0 unit  171 -  210 1 units  211 - 250 2 units  251 - 290 3 units  291 - 330 4 units  331 - 370 5 units  371 - 410 6 units      HOW TO TREAT LOW BLOOD SUGARS (Blood sugar LESS THAN 70 MG/DL) Please follow the RULE OF 15 for the treatment of hypoglycemia treatment (when your (blood sugars are less than 70 mg/dL)   STEP 1: Take 15 grams of carbohydrates when your blood sugar is low, which includes:  3-4 GLUCOSE TABS  OR 3-4 OZ OF JUICE OR REGULAR SODA OR ONE TUBE OF GLUCOSE GEL    STEP 2: RECHECK blood sugar in 15 MINUTES STEP 3: If your blood sugar is still low at the 15 minute recheck --> then, go back to STEP 1 and treat AGAIN with another 15 grams of carbohydrates.  But has slightly

## 2023-12-22 ENCOUNTER — Encounter: Payer: Self-pay | Admitting: Internal Medicine

## 2023-12-24 ENCOUNTER — Other Ambulatory Visit: Payer: Self-pay | Admitting: Gastroenterology

## 2023-12-24 ENCOUNTER — Ambulatory Visit (HOSPITAL_COMMUNITY)
Admission: RE | Admit: 2023-12-24 | Discharge: 2023-12-24 | Disposition: A | Discharge status: Home / Self Care | Payer: 59 | Source: Ambulatory Visit | Attending: Gastroenterology | Admitting: Gastroenterology

## 2023-12-24 ENCOUNTER — Other Ambulatory Visit (HOSPITAL_COMMUNITY): Payer: 59

## 2023-12-24 DIAGNOSIS — R1310 Dysphagia, unspecified: Secondary | ICD-10-CM

## 2023-12-27 ENCOUNTER — Other Ambulatory Visit (HOSPITAL_COMMUNITY): Payer: 59

## 2024-01-26 ENCOUNTER — Other Ambulatory Visit: Payer: Self-pay

## 2024-01-26 ENCOUNTER — Telehealth: Payer: Self-pay | Admitting: Internal Medicine

## 2024-01-26 DIAGNOSIS — Z794 Long term (current) use of insulin: Secondary | ICD-10-CM

## 2024-01-26 MED ORDER — FREESTYLE LIBRE 2 READER DEVI: 1.0000 | 0 refills | Status: AC

## 2024-01-26 NOTE — Telephone Encounter (Signed)
.  MEDICATION:  FreeStyle Libre 2 Reader Continuous Blood Gluc Receiver (FREESTYLE LIBRE 2 READER) DEVI  PHARMACY:    [facility] [place] - [place], [place] - [address] (Ph: [phone])    HAS THE PATIENT CONTACTED THEIR PHARMACY?  Yes  IS THIS A 90 DAY SUPPLY : Yes  IS PATIENT OUT OF MEDICATION: No  IF NOT; HOW MUCH IS LEFT: Has 1 left & it will be out tomorrow (01/27/2024)  LAST APPOINTMENT DATE: @7 /23/2024  NEXT APPOINTMENT DATE:@7 /29/2025  DO WE HAVE YOUR PERMISSION TO LEAVE A DETAILED MESSAGE?:Yes  OTHER COMMENTS:    **Let patient know to contact pharmacy at the end of the day to make sure medication is ready. **  ** Please notify patient to allow 48-72 hours to process**  **Encourage patient to contact the pharmacy for refills or they can request refills through [facility]**

## 2024-02-05 ENCOUNTER — Other Ambulatory Visit: Payer: Self-pay | Admitting: Adult Health

## 2024-03-03 ENCOUNTER — Telehealth: Payer: 59 | Admitting: Adult Health

## 2024-03-03 DIAGNOSIS — G250 Essential tremor: Secondary | ICD-10-CM | POA: Diagnosis not present

## 2024-03-03 NOTE — Progress Notes (Signed)
 PATIENT: Kristen Weber DOB: [birth year]  REASON FOR VISIT: follow up HISTORY FROM: patient  Virtual Visit via Video Note  I connected with Kristen Weber on 03/03/24 at  9:00 AM EDT by a video enabled telemedicine application located remotely at [facility] and verified that I am speaking with the correct person using two identifiers who was located at their own home.   I discussed the limitations of evaluation and management by telemedicine and the availability of in person appointments. The patient expressed understanding and agreed to proceed.   PATIENT: Kristen Weber DOB: May 15, 1934  REASON FOR VISIT: follow up HISTORY FROM: patient  HISTORY OF PRESENT ILLNESS: Today 03/03/24:  Kristen Weber is a 89 y.o. female with a history of essential tremor. Returns today for follow-up.  At the last visit we reduced Topamax to 25 mg at bedtime.  Her daughter feels that her memory did improve after that.  She reports that she still notices some changes with her memory but not as significant before the reduction in Topamax.  She primarily notices the tremors in the upper extremities.  She reports the right side is worse than the left.  She is right-handed.  She notices it when holding a drink.  Although she does state that her writing has improved since she has been on Topamax.  In the past she has tried Sinemet, beta-blockers primidone and gabapentin.   07/27/23: Kristen Weber is a 89 y.o. female with a history of essential tremor. Returns today for follow-up.  Patient feels that her tremor has remained relatively stable.  She continues on Topamax 25 mg twice a day.  Her daughter reports that her memory has gotten worse.  She states that she is forgetful with where she puts objects.   Patient has never had a formal memory evaluation  HISTORY Kristen Weber is a 89 y.o. female who has been followed in this office for essential tremor.  She was unable to do a video visit.  She reports  that her symptoms have remained stable.  She continues on Topamax 25 mg twice a day.  She states that she has a hard time with her handwriting but she is able to button buttons and feed herself.  Denies any new symptoms.   REVIEW OF SYSTEMS: Out of a complete 14 system review of symptoms, the patient complains only of the following symptoms, and all other reviewed systems are negative.  ALLERGIES: Allergies  Allergen Reactions   Lisinopril Cough    cough    HOME MEDICATIONS: Outpatient Medications Prior to Visit  Medication Sig Dispense Refill   aspirin EC 81 MG tablet Take 1 tablet (81 mg total) by mouth at bedtime. 90 tablet 3   atorvastatin (LIPITOR) 20 MG tablet TAKE ONE TABLET BY MOUTH EVERY MORNING 90 tablet 3   cetirizine (ZYRTEC) 10 MG chewable tablet Chew 10 mg by mouth daily.     Cholecalciferol (VITAMIN D3 PO) Take 850 mg by mouth 2 (two) times daily.     Continuous Glucose Receiver (FREESTYLE LIBRE 2 READER) DEVI 1 Device by Does not apply route as directed. 1 each 0   diltiazem (CARDIZEM CD) 120 MG 24 hr capsule TAKE ONE CAPSULE BY MOUTH AT BEDTIME 90 capsule 3   insulin glargine (LANTUS SOLOSTAR) 100 UNIT/ML Solostar Pen Inject 12 Units into the skin daily. 15 mL 1   insulin lispro (HUMALOG KWIKPEN) 100 UNIT/ML KwikPen Max daily 30 units 15 mL 11   Insulin Pen Needle  32G X 4 MM MISC 1 Device by Does not apply route in the morning, at noon, in the evening, and at bedtime. 150 each 6   isosorbide mononitrate (IMDUR) 60 MG 24 hr tablet TAKE ONE TABLET BY MOUTH EVERY MORNING 90 tablet 3   losartan (COZAAR) 50 MG tablet Take 1 tablet (50 mg total) by mouth daily. 90 tablet 3   nitroGLYCERIN (NITROSTAT) 0.4 MG SL tablet Place 1 tablet (0.4 mg total) under the tongue every 5 (five) minutes as needed. 25 tablet 2   pantoprazole (PROTONIX) 20 MG tablet Take 1 tablet (20 mg total) by mouth daily. Take 30 minutes before breakfast 30 tablet 5   topiramate (TOPAMAX) 25 MG tablet TAKE ONE  TABLET BY MOUTH AT BEDTIME 90 tablet 1   traMADol (ULTRAM) 50 MG tablet Take 100 mg by mouth daily as needed.     No facility-administered medications prior to visit.    PAST MEDICAL HISTORY: Past Medical History:  Diagnosis Date   Abnormal cardiovascular stress test 09/20/2013   Possibly abnormal nuclear stress at [facility]-anteroseptal abnormality interpreted as possible breast attenuation as well, motion artifact. Normal EF. No TID. Watching medically   Allergy    Anxiety    Cataract    Diabetes (HCC) 09/20/2013   Diabetes mellitus (HCC)    Type 2   Essential hypertension 09/20/2013   GERD (gastroesophageal reflux disease)    H/O: hysterectomy    Hyperlipidemia 09/20/2013   RA (rheumatoid arthritis) (HCC)    Sleep apnea    uses cpap   Thyroid disease    Thyroid Nodule    PAST SURGICAL HISTORY: Past Surgical History:  Procedure Laterality Date   CARDIAC CATHETERIZATION  02/12/2015   LEFT HEART CATHETERIZATION WITH CORONARY ANGIOGRAM N/A 02/12/2015   Procedure: LEFT HEART CATHETERIZATION WITH CORONARY ANGIOGRAM;  Surgeon: Lennette Bihari, MD;  Location: [facility] CATH LAB;  Service: Cardiovascular;  Laterality: N/A;   TOTAL ABDOMINAL HYSTERECTOMY      FAMILY HISTORY: Family History  Problem Relation Age of Onset   Diabetes Mother    Colon cancer Neg Hx    Prostate cancer Neg Hx    Rectal cancer Neg Hx    Stomach cancer Neg Hx     SOCIAL HISTORY: Social History   Socioeconomic History   Marital status: Widowed    Spouse name: Greggory Stallion   Number of children: 2   Years of education: 12   Highest education level: Not on file  Occupational History   Occupation: Retired  Tobacco Use   Smoking status: Never   Smokeless tobacco: Current    Types: Snuff  Vaping Use   Vaping status: Never Used  Substance and Sexual Activity   Alcohol use: No    Alcohol/week: 0.0 standard drinks of alcohol   Drug use: No   Sexual activity: Not Currently  Other Topics Concern   Not on  file  Social History Narrative   Patient lives alone 03/04/17   Patient has 2 children.    Patient is retired.   Patient is right-handed.     Caffeine use: Drinks 1 cup coffee per day   Social Drivers of Health   Financial Resource Strain: Low Risk  (11/24/2022)   Received [facility], [facility]   Overall Financial Resource Strain (CARDIA)    Difficulty of Paying Living Expenses: Not hard at all  Food Insecurity: No Food Insecurity (08/09/2023)   Received from [facility]   Hunger Vital Sign  Worried About Programme researcher, broadcasting/film/video in the Last Year: Never true    Ran Out of Food in the Last Year: Never true  Transportation Needs: No Transportation Needs (08/09/2023)   Received from [facility]   PRAPARE - Transportation    Lack of Transportation (Medical): No    Lack of Transportation (Non-Medical): No  Physical Activity: Sufficiently Active (11/24/2022)   Received from [facility], [facility]   Exercise Vital Sign    Days of Exercise per Week: 6 days    Minutes of Exercise per Session: 120 min  Stress: No Stress Concern Present (11/24/2022)   Received from [facility], [facility] of Occupational [facility] - Occupational Stress Questionnaire    Feeling of Stress : Not at all  Social Connections: Moderately Integrated (11/24/2022)   Received from [facility], [facility]   Social Connection and Isolation Panel [NHANES]    Frequency of Communication with Friends and Family: More than three times a week    Frequency of Social Gatherings with Friends and Family: More than three times a week    Attends Religious Services: More than 4 times per year    Active Member of [employer] or Organizations: Yes    Attends Banker Meetings: More than 4 times per year    Marital Status: Widowed  Intimate Partner Violence: Not At Risk (11/24/2022)   Received from [facility], [facility]   Humiliation, Afraid, Rape, and Kick  questionnaire    Fear of Current or Ex-Partner: No    Emotionally Abused: No    Physically Abused: No    Sexually Abused: No      PHYSICAL EXAM Generalized: Well developed, in no acute distress   Neurological examination  Mentation: Alert oriented to time, place, history taking. Follows all commands speech and language fluent Cranial nerve II-XII: Facial symmetry noted  DIAGNOSTIC DATA (LABS, IMAGING, TESTING) - I reviewed patient records, labs, notes, testing and imaging myself where available.  Lab Results  Component Value Date   WBC 4.0 01/08/2022   HGB 13.2 01/08/2022   HCT 40.1 01/08/2022   MCV 89.6 01/08/2022   PLT 151.0 01/08/2022      Component Value Date/Time   NA 141 12/08/2022 1037   K 3.9 12/08/2022 1037   CL 106 12/08/2022 1037   CO2 28 12/08/2022 1037   GLUCOSE 134 (H) 12/08/2022 1037   BUN 14 12/08/2022 1037   CREATININE 1.30 (H) 03/15/2023 0941   CALCIUM 11.1 (H) 12/08/2022 1037   PROT 6.3 01/08/2022 1203   ALBUMIN 4.2 12/08/2022 1037   AST 15 01/08/2022 1203   ALT 12 01/08/2022 1203   ALKPHOS 67 01/08/2022 1203   BILITOT 0.5 01/08/2022 1203   GFRNONAA 49 (L) 09/27/2015 1159   GFRAA 56 (L) 09/27/2015 1159   Lab Results  Component Value Date   CHOL 184 10/17/2014   HDL 44.80 10/17/2014   LDLDIRECT 64.8 10/17/2014   TRIG 311.0 (H) 10/17/2014   CHOLHDL 4 10/17/2014   Lab Results  Component Value Date   HGBA1C 7.3 (A) 12/21/2023   No results found for: "VITAMINB12" Lab Results  Component Value Date   TSH 4.03 04/30/2022      ASSESSMENT AND PLAN 89 y.o. year old female  has a past medical history of Abnormal cardiovascular stress test (09/20/2013), Allergy, Anxiety, Cataract, Diabetes (HCC) (09/20/2013), Diabetes mellitus (HCC), Essential hypertension (09/20/2013), GERD (gastroesophageal reflux disease), H/O:  hysterectomy, Hyperlipidemia (09/20/2013), RA (rheumatoid arthritis) (HCC), Sleep apnea, and Thyroid disease. here  with:  Essential tremor  - Continue Topamax to 25 mg at bedtime.   -Advise next visit we will do an in office visit to do a physical exam.  If she still having trouble with her memory will consider doing an MMSE. - Follow-up in 6 months IN OFFICE or sooner if needed   Butch Penny, MSN, NP-C 03/03/2024, 9:09 AM [facility] [address], [address] [place], [place] [address] [phone]

## 2024-03-20 ENCOUNTER — Other Ambulatory Visit: Payer: Self-pay

## 2024-03-20 MED ORDER — PANTOPRAZOLE SODIUM 20 MG PO TBEC: 20.0000 mg | DELAYED_RELEASE_TABLET | Freq: Every day | ORAL | 5 refills | Status: DC

## 2024-06-20 ENCOUNTER — Ambulatory Visit: Payer: 59 | Admitting: Internal Medicine

## 2024-06-27 ENCOUNTER — Ambulatory Visit: Admitting: Internal Medicine

## 2024-07-03 ENCOUNTER — Encounter: Payer: Self-pay | Admitting: Internal Medicine

## 2024-07-03 ENCOUNTER — Ambulatory Visit (INDEPENDENT_AMBULATORY_CARE_PROVIDER_SITE_OTHER): Admitting: Internal Medicine

## 2024-07-03 VITALS — BP 110/74 | HR 80 | Ht 63.0 in | Wt 184.0 lb

## 2024-07-03 DIAGNOSIS — Z794 Long term (current) use of insulin: Secondary | ICD-10-CM | POA: Diagnosis not present

## 2024-07-03 DIAGNOSIS — E210 Primary hyperparathyroidism: Secondary | ICD-10-CM

## 2024-07-03 DIAGNOSIS — E1142 Type 2 diabetes mellitus with diabetic polyneuropathy: Secondary | ICD-10-CM

## 2024-07-03 DIAGNOSIS — E1159 Type 2 diabetes mellitus with other circulatory complications: Secondary | ICD-10-CM | POA: Diagnosis not present

## 2024-07-03 DIAGNOSIS — R809 Proteinuria, unspecified: Secondary | ICD-10-CM | POA: Diagnosis not present

## 2024-07-03 LAB — POCT GLYCOSYLATED HEMOGLOBIN (HGB A1C): Hemoglobin A1C: 7.6 % — AB (ref 4.0–5.6)

## 2024-07-03 NOTE — Patient Instructions (Addendum)
 Blood pressure today 110/74, I suspect this may be the reason for the dizziness given that losartan  has been increased.  My suggestion would be to talk to the heart doctor regarding the possibility of decreasing Cardizem , or which ever heart medication that they would see if it.   - Lantus  12 units daily  -Humalog  correctional insulin : Use the scale below to help guide you before each meal   Blood sugar before meal Number of units to inject  Less than 170 0 unit  171 -  210 1 units  211 - 250 2 units  251 - 290 3 units  291 - 330 4 units  331 - 370 5 units  371 - 410 6 units      HOW TO TREAT LOW BLOOD SUGARS (Blood sugar LESS THAN 70 MG/DL) Please follow the RULE OF 15 for the treatment of hypoglycemia treatment (when your (blood sugars are less than 70 mg/dL)   STEP 1: Take 15 grams of carbohydrates when your blood sugar is low, which includes:  3-4 GLUCOSE TABS  OR 3-4 OZ OF JUICE OR REGULAR SODA OR ONE TUBE OF GLUCOSE GEL    STEP 2: RECHECK blood sugar in 15 MINUTES STEP 3: If your blood sugar is still low at the 15 minute recheck --> then, go back to STEP 1 and treat AGAIN with another 15 grams of carbohydrates.  But has slightly

## 2024-07-03 NOTE — Progress Notes (Signed)
 Name: Kristen Weber  Age/ Sex: 89 y.o., female   MRN/ DOB: [id number], 11-07-1934     PCP: Claudene Arland BROCKS, NP   Reason for Endocrinology Evaluation: Type 2 Diabetes Mellitus  Initial Endocrine Consultative Visit: 01/03/2021    PATIENT IDENTIFIER: Kristen Weber is a 89 y.o. female with a past medical history of T2DM, CAD, HTN and Dyslipidemia. The patient has followed with Endocrinology clinic since 01/03/2021 for consultative assistance with management of her diabetes.  DIABETIC HISTORY:  Kristen Weber was diagnosed with DM yrs ago,Metformin-ineffective. Her hemoglobin A1c has ranged from 6.6% in 2016, peaking at 7.9% in 2015.  On her initial visit to our clinic she had an  A1c 6.1%, pt with recurrent hypoglycemia, she was on basal insulin  and Glipizide  . We stopped Glipizide , reduced basal insulin  and offered add-on therapy ( GLP-1 agonist vs prandial insulin ) due to low GFR of 38 . Opted for prandial insulin      MNG/PARATHYROID  HISTORY:  Patient has been following up with Dr. Faythe for multinodular goiter and hypercalcemia for many years. She has no history of thyroid  nodule FNAs in the past  24-hour urinary calcium  normal at 209 mg in August, 2025  SUBJECTIVE:   During the last visit (12/21/2023): A1c 7.0 %.     Today (07/03/2024): Kristen Weber is here for a follow up on diabetes management.  She is accompanied by her daughter. She checks her blood sugars 3 times daily through freestyle libre. The patient has had hypoglycemic episodes since the last clinic visit.    She follows with cardiology for PVD, CAD, HTN and dyslipidemia-managed medically She continues to follow-up with vascular and vein specialist for left internal iliac artery aneurysm Follows with neurology for essential tremors    Denies local neck swelling  Denies nausea, vomiting Continues with chronic  constipation , uses senna  Denies renal stones  She stays hydrated  Has noted dizziness , worse with walking    Daughter has bought beet juice which caused darkening of the urine, this has resolved with discontinuation of beet juice, reassurance provided  HOME DIABETES REGIMEN:  Lantus  12 units daily  CF: HUmalog  (BG-130/ 40) Vitamin D  2000 iu daily  Losartan  50 mg , 1.5 tabs daily      Statin: yes ACE-I/ARB: yes Prior Diabetic Education: yes   CONTINUOUS GLUCOSE MONITORING RECORD INTERPRETATION    Dates of Recording:7/29-8/09/2024  Sensor description:dexcom  Results statistics:   CGM use % of time 76  Average and SD 146/24.4  Time in range 81 %  % Time Above 180 19  % Time above 250 0  % Time Below target 0    Glycemic patterns summary: BGs are optimal overnight with slight fluctuations during the day  Hyperglycemic episodes rare, postprandial  Hypoglycemic episodes occurred N/A  Overnight periods: Optimal      DIABETIC COMPLICATIONS: Microvascular complications:  Neuropathy, CKD Denies:  retinopathy Last Eye Exam: Completed 12/02/2023  Macrovascular complications:  CAD, CVA Denies:  PVD   HISTORY:  Past Medical History:  Past Medical History:  Diagnosis Date   Abnormal cardiovascular stress test 09/20/2013   Possibly abnormal nuclear stress at [place]-anteroseptal abnormality interpreted as possible breast attenuation as well, motion artifact. Normal EF. No TID. Watching medically   Allergy    Anxiety    Cataract    Diabetes (HCC) 09/20/2013   Diabetes mellitus (HCC)    Type 2   Essential hypertension 09/20/2013   GERD (gastroesophageal reflux disease)    H/O: hysterectomy  Hyperlipidemia 09/20/2013   RA (rheumatoid arthritis) (HCC)    Sleep apnea    uses cpap   Thyroid  disease    Thyroid  Nodule   Past Surgical History:  Past Surgical History:  Procedure Laterality Date   CARDIAC CATHETERIZATION  02/12/2015   LEFT HEART CATHETERIZATION WITH CORONARY ANGIOGRAM N/A 02/12/2015   Procedure: LEFT HEART CATHETERIZATION WITH CORONARY ANGIOGRAM;   Surgeon: Debby DELENA Sor, MD;  Location: [facility] CATH LAB;  Service: Cardiovascular;  Laterality: N/A;   TOTAL ABDOMINAL HYSTERECTOMY     Social History:  reports that she has never smoked. Her smokeless tobacco use includes snuff. She reports that she does not drink alcohol and does not use drugs. Family History:  Family History  Problem Relation Age of Onset   Diabetes Mother    Colon cancer Neg Hx    Prostate cancer Neg Hx    Rectal cancer Neg Hx    Stomach cancer Neg Hx      HOME MEDICATIONS: Allergies as of 07/03/2024       Reactions   Lisinopril Cough   cough        Medication List        Accurate as of July 03, 2024 12:26 PM. If you have any questions, ask your nurse or doctor.          aspirin  EC 81 MG tablet Take 1 tablet (81 mg total) by mouth at bedtime.   atorvastatin  20 MG tablet Commonly known as: LIPITOR TAKE ONE TABLET BY MOUTH EVERY MORNING   cetirizine 10 MG chewable tablet Commonly known as: ZYRTEC Chew 10 mg by mouth daily.   diltiazem  120 MG 24 hr capsule Commonly known as: CARDIZEM  CD TAKE ONE CAPSULE BY MOUTH AT BEDTIME   FreeStyle Libre 2 Reader Devi 1 Device by Does not apply route as directed.   insulin  lispro 100 UNIT/ML KwikPen Commonly known as: HumaLOG  KwikPen Max daily 30 units   Insulin  Pen Needle 32G X 4 MM Misc 1 Device by Does not apply route in the morning, at noon, in the evening, and at bedtime.   isosorbide  mononitrate 60 MG 24 hr tablet Commonly known as: IMDUR  TAKE ONE TABLET BY MOUTH EVERY MORNING   Lantus  SoloStar 100 UNIT/ML Solostar Pen Generic drug: insulin  glargine Inject 12 Units into the skin daily.   losartan  50 MG tablet Commonly known as: COZAAR  Take 1 tablet (50 mg total) by mouth daily.   nitroGLYCERIN  0.4 MG SL tablet Commonly known as: Nitrostat  Place 1 tablet (0.4 mg total) under the tongue every 5 (five) minutes as needed.   pantoprazole  20 MG tablet Commonly known as: Protonix  Take 1  tablet (20 mg total) by mouth daily. Take 30 minutes before breakfast   topiramate  25 MG tablet Commonly known as: TOPAMAX  TAKE ONE TABLET BY MOUTH AT BEDTIME   traMADol  50 MG tablet Commonly known as: ULTRAM  Take 100 mg by mouth daily as needed.   VITAMIN D3 PO Take 850 mg by mouth 2 (two) times daily.         OBJECTIVE:   Vital Signs: BP 110/74 (BP Location: Left Arm, Patient Position: Sitting, Cuff Size: Normal)   Pulse 80   Ht 5' 3 (1.6 m)   Wt 184 lb (83.5 kg)   SpO2 96%   BMI 32.59 kg/m   Wt Readings from Last 3 Encounters:  07/03/24 184 lb (83.5 kg)  12/21/23 186 lb (84.4 kg)  12/16/23 186 lb (84.4 kg)  Exam: General: Pt appears well and is in NAD Right thyroid  asymmetry  appreciated  Lungs: Clear with good BS bilat   Heart: RRR   Extremities: No pretibial edema.   Neuro: MS is good with appropriate affect, pt is alert and Ox3    DM Foot Exam 12/21/2023  The skin of the feet is intact without sores or ulcerations. The pedal pulses are 2+ on right and 2+ on left. The sensation is intact to a screening 5.07, 10 gram monofilament bilaterally    DATA REVIEWED:  Lab Results  Component Value Date   HGBA1C 7.6 (A) 07/03/2024   HGBA1C 7.3 (A) 12/21/2023   HGBA1C 7.0 (A) 12/08/2022         Latest Reference Range & Units 07/03/24 12:34  Sodium 135 - 146 mmol/L 141  Potassium 3.5 - 5.3 mmol/L 4.0  Chloride 98 - 110 mmol/L 106  CO2 20 - 32 mmol/L 29  Glucose 65 - 99 mg/dL 891 (H)  BUN 7 - 25 mg/dL 15  Creatinine 9.39 - 9.04 mg/dL 8.86 (H)  Calcium  8.6 - 10.4 mg/dL 89.3 (H)  BUN/Creatinine Ratio 6 - 22 (calc) 13  eGFR > OR = 60 mL/min/1.67m2 47 (L)  (H): Data is abnormally high (L): Data is abnormally low   Thyroid  Ultrasound 03/08/2021  Mir, Aliene Senior, MD - 03/11/2021 Formatting of this note might be different from the original. CLINICAL DATA:  Difficulty swallowing pills  EXAM: THYROID  ULTRASOUND  TECHNIQUE: Ultrasound  examination of the thyroid  gland and adjacent soft tissues was performed.  COMPARISON:  Ultrasound neck 01/13/2019  FINDINGS: Parenchymal Echotexture: Mildly heterogeneous  Isthmus: 0.2 cm  Right lobe: 5.0 x 1.7 x 1.5 cm  Left lobe: 3.0 x 1.2 x 1.4 cm  _________________________________________________________  Estimated total number of nodules >/= 1 cm: 1  Number of spongiform nodules >/=  2 cm not described below (TR1): 0  Number of mixed cystic and solid nodules >/= 1.5 cm not described below (TR2): 0  _________________________________________________________  Nodule # 1:  Prior biopsy: No  Location: Right; inferior  Maximum size: 1.1 cm; Other 2 dimensions: 0.8 x 1.0 cm, previously, 1.2 x 1.0 x 1.0 cm  Composition: solid/almost completely solid (2)  Echogenicity: isoechoic (1)  Shape: not taller-than-wide (0)  Margins: ill-defined (0)  Echogenic foci: none (0)  ACR TI-RADS total points: 3.  ACR TI-RADS risk category:  TR3 (3 points).  Significant change in size (>/= 20% in two dimensions and minimal increase of 2 mm): No  Change in features: No  Change in ACR TI-RADS risk category: No  ACR TI-RADS recommendations:  Given size (<1.4 cm) and appearance, this nodule does NOT meet TI-RADS criteria for biopsy or dedicated follow-up.  _________________________________________________________  Subcentimeter left thyroid  nodule does not meet criteria for FNA or imaging follow-up.  IMPRESSION: No suspicious thyroid  nodules.    Old records , labs and images have been reviewed.    ASSESSMENT / PLAN / RECOMMENDATIONS:   1) Type 2 Diabetes Mellitus, optimally controlled, With neuropathic, CKD III and macrovascular complications - Most recent A1c of 7.6 %. Goal A1c <7.5%.    -A1c continues to trend up -Fasting BG's are optimal, patient with postprandial hyperglycemia, I have encouraged the patient to use Humalog  per correction scale with each  meal -They had declines GLP-1 agonists in the past   MEDICATIONS: -Continue Lantus  12 units daily  -Correction factor : Humalog  (BG -140/40) TIDQAC  EDUCATION / INSTRUCTIONS: BG monitoring instructions: Patient is instructed  to check her blood sugars 3 times a day, before meals. Call [place] if: BG persistently < 70  I reviewed the Rule of 15 for the treatment of hypoglycemia in detail with the patient. Literature supplied.   2) Diabetic complications:  Eye: Does not have known diabetic retinopathy.  Neuro/ Feet: Does have known diabetic peripheral neuropathy .  Renal: Patient does  have known baseline CKD. She   is on an ACEI/ARB at present.     3) Hyperparathyroidism:  - This is managed medically due to advanced age - 24-hour urinary calcium  excretion is normal at 209 mg/DL August, 7974 -I have attempted to order DXA scan but per daughter this has been done by previous endocrinologist within the past year, no records available - Serum calcium  elevated but trending down - PTH normal - GFR improving  Recommendations Stay hydrated Avoid over-the-counter calcium  tablets Consume 2-3 servings of dietary calcium  daily  4) Microalbuminuria:  -I have increased losartan  in January, 2025, but this was increased further by PCP, per daughter. - Patient has been dizzy which daughter attributes to possibly increasing losartan , we did discuss renal benefits of losartan , and I have recommended decreasing other antihypertensives to cardiology,   Medication Continue losartan  50 mg, 1.5 tablets daily  5) Dizziness  -Suspect this may be due to low-normal BP - I have advised the daughter to follow-up with cardiology regarding the possibility of decreasing Cardizem   F/U in 6 months  Signed electronically by: Stefano Redgie Butts, MD  [facility]  [facility] [address] [place] [place], [place] [address] Phone: [phone] FAX:  [phone]   CC: Claudene Arland BROCKS, NP [address] [place] [address] Phone: [phone]  Fax: [phone]  Return to Endocrinology clinic as below: Future Appointments  Date Time Provider Department Center  08/01/2024  9:30 AM Sherryl Bouchard, NP [facility] None

## 2024-07-04 ENCOUNTER — Telehealth: Payer: Self-pay | Admitting: Cardiology

## 2024-07-04 ENCOUNTER — Ambulatory Visit: Payer: Self-pay | Admitting: Internal Medicine

## 2024-07-04 LAB — BASIC METABOLIC PANEL WITH GFR
BUN/Creatinine Ratio: 13 (calc) (ref 6–22)
BUN: 15 mg/dL (ref 7–25)
CO2: 29 mmol/L (ref 20–32)
Calcium: 10.6 mg/dL — ABNORMAL HIGH (ref 8.6–10.4)
Chloride: 106 mmol/L (ref 98–110)
Creat: 1.13 mg/dL — ABNORMAL HIGH (ref 0.60–0.95)
Glucose, Bld: 108 mg/dL — ABNORMAL HIGH (ref 65–99)
Potassium: 4 mmol/L (ref 3.5–5.3)
Sodium: 141 mmol/L (ref 135–146)
eGFR: 47 mL/min/1.73m2 — ABNORMAL LOW (ref >=60)

## 2024-07-04 LAB — VITAMIN D 25 HYDROXY (VIT D DEFICIENCY, FRACTURES): Vit D, 25-Hydroxy: 59 ng/mL (ref 30–100)

## 2024-07-04 LAB — CREATININE, URINE, 24 HOUR: Creatinine, 24H Ur: 1.38 g/(24.h) (ref 0.50–2.15)

## 2024-07-04 LAB — CALCIUM, URINE, 24 HOUR: Calcium, 24H Urine: 209 mg/(24.h)

## 2024-07-04 LAB — PARATHYROID HORMONE, INTACT (NO CA): PTH: 65 pg/mL (ref 16–77)

## 2024-07-04 NOTE — Telephone Encounter (Signed)
 Please let the daughter/patient know that the urine test came back normal, kidney function is improving  Calcium  continues to be elevated but it is better  Parathyroid  hormone and vitamin D  are normal but she does need to decrease vitamin D  from 2000 international units to 1000 international units daily    Encourage hydration   Thanks

## 2024-07-04 NOTE — Telephone Encounter (Signed)
 Spoke with daughter Kristen Weber) OK per DPR on file who is reporting pt has been having some lightheadedness and her BP has been a little low since Kristen Sharps, NP changed her Losartan  to 75 mg a day about 2 to 3 months ago.  Daughter reports she mentioned this to Kristen Weber who advised to contact Kristen Weber.  Advised daughter since Kristen Weber PIETY adjusted Losartan  to 75 mg daily (per daughter's report) and pt is having dizziness, she should contact her office for management of BP medication.  She states understanding.

## 2024-07-04 NOTE — Telephone Encounter (Signed)
 Jeffrie Oneil BROCKS, MD to Me (Selected Message)    07/04/24  5:52 PM OK to stop ARB Oneil Jeffrie, MD    Spoke with daughter who is aware of the above instructions.  She is uncomfortable with pt stopping the losartan  because her PCP said it will protect her kidneys.  Daughter would like to know if Diltiazem  should be decreased or stopped instead.

## 2024-07-04 NOTE — Telephone Encounter (Signed)
 STAT if patient feels like he/she is going to faint   Are you dizzy, lightheaded, or faint now? Yes (a little dizzy)  Have you passed out? No IF YES MOVE TO .SYNCOPECVD  Do you have any other symptoms? No  Have you checked your HR and BP (record if available)? 107/74 yesterday    Pt's daughter is calling as they went to PCP yesterday and PCP is worried cetirizine  is making patient dizzy

## 2024-07-06 NOTE — Telephone Encounter (Signed)
 Spoke with daughter, Erminio who reports pt's PCP decreased Losartan  to 50 mg a day.  Advised Dr Jeffrie did say OK to d/c diltiazem .  Per daughter, she plans to give Losartan  50 mg daily and continue the diltiazem  for now.  She will call back if any questions or concerns.

## 2024-08-01 ENCOUNTER — Encounter: Payer: Self-pay | Admitting: Adult Health

## 2024-08-01 ENCOUNTER — Ambulatory Visit (INDEPENDENT_AMBULATORY_CARE_PROVIDER_SITE_OTHER): Admitting: Adult Health

## 2024-08-01 VITALS — BP 140/87 | HR 86 | Ht 63.0 in | Wt 182.0 lb

## 2024-08-01 DIAGNOSIS — R413 Other amnesia: Secondary | ICD-10-CM | POA: Diagnosis not present

## 2024-08-01 DIAGNOSIS — G250 Essential tremor: Secondary | ICD-10-CM | POA: Diagnosis not present

## 2024-08-01 MED ORDER — TOPIRAMATE 25 MG PO TABS: 25.0000 mg | ORAL_TABLET | Freq: Every day | ORAL | 3 refills | Status: AC

## 2024-08-01 NOTE — Patient Instructions (Signed)
 Your Plan:  Continue Topamax  25 mg at bedtime If your symptoms worsen or you develop new symptoms please let us  know.       Thank you for coming to see us  at [facility]. I hope we have been able to provide you high quality care today.  You may receive a patient satisfaction survey over the next few weeks. We would appreciate your feedback and comments so that we may continue to improve ourselves and the health of our patients.

## 2024-08-01 NOTE — Progress Notes (Signed)
 PATIENT: Kristen Weber DOB: 1934-08-22  REASON FOR VISIT: follow up HISTORY FROM: patient  Chief Complaint  Patient presents with   Rm 19    Patient is here with her tremor and memory. Daughter feels her memory is about the same. States patient does well for the most part.       HISTORY OF PRESENT ILLNESS: Today 08/01/24:  Kristen Weber is a 89 y.o. female with a history of essential tremor. Returns today for follow-up.  She remains on Topamax  25 mg at bedtime.  Overall she feels that her tremor is stable.  Since he noticed the tremor more in the right hand than left.  She does feel that her memory has remained stable.  Her daughter is with her today and agrees with that.  Her daughter lives with her.  She does require help physically with ADLs.  Daughter reports that this has nothing to do with her mentation.  She does operate a motor vehicle but only for small distances.  Daughter assist her with her medications appointments and finances.  She returns today for an evaluation.     03/03/24: Kristen Weber is a 89 y.o. female with a history of essential tremor. Returns today for follow-up.  At the last visit we reduced Topamax  to 25 mg at bedtime.  Her daughter feels that her memory did improve after that.  She reports that she still notices some changes with her memory but not as significant before the reduction in Topamax .  She primarily notices the tremors in the upper extremities.  She reports the right side is worse than the left.  She is right-handed.  She notices it when holding a drink.  Although she does state that her writing has improved since she has been on Topamax .  In the past she has tried Sinemet , beta-blockers primidone  and gabapentin .   07/27/23: Kristen Weber is a 89 y.o. female with a history of essential tremor. Returns today for follow-up.  Patient feels that her tremor has remained relatively stable.  She continues on Topamax  25 mg twice a day.  Her daughter reports  that her memory has gotten worse.  She states that she is forgetful with where she puts objects.   Patient has never had a formal memory evaluation  HISTORY Kristen Weber is a 89 y.o. female who has been followed in this office for essential tremor.  She was unable to do a video visit.  She reports that her symptoms have remained stable.  She continues on Topamax  25 mg twice a day.  She states that she has a hard time with her handwriting but she is able to button buttons and feed herself.  Denies any new symptoms.   REVIEW OF SYSTEMS: Out of a complete 14 system review of symptoms, the patient complains only of the following symptoms, and all other reviewed systems are negative.  ALLERGIES: Allergies  Allergen Reactions   Lisinopril Cough    cough    HOME MEDICATIONS: Outpatient Medications Prior to Visit  Medication Sig Dispense Refill   aspirin  EC 81 MG tablet Take 1 tablet (81 mg total) by mouth at bedtime. 90 tablet 3   atorvastatin  (LIPITOR) 20 MG tablet TAKE ONE TABLET BY MOUTH EVERY MORNING 90 tablet 3   cetirizine (ZYRTEC) 10 MG chewable tablet Chew 10 mg by mouth daily.     Continuous Glucose Receiver (FREESTYLE LIBRE 2 READER) DEVI 1 Device by Does not apply route as directed. 1 each 0  diltiazem  (CARDIZEM  CD) 120 MG 24 hr capsule TAKE ONE CAPSULE BY MOUTH AT BEDTIME 90 capsule 3   insulin  glargine (LANTUS  SOLOSTAR) 100 UNIT/ML Solostar Pen Inject 12 Units into the skin daily. 15 mL 1   insulin  lispro (HUMALOG  KWIKPEN) 100 UNIT/ML KwikPen Max daily 30 units 15 mL 11   Insulin  Pen Needle 32G X 4 MM MISC 1 Device by Does not apply route in the morning, at noon, in the evening, and at bedtime. 150 each 6   isosorbide  mononitrate (IMDUR ) 60 MG 24 hr tablet TAKE ONE TABLET BY MOUTH EVERY MORNING 90 tablet 3   losartan  (COZAAR ) 50 MG tablet Take 1 tablet (50 mg total) by mouth daily. 90 tablet 3   nitroGLYCERIN  (NITROSTAT ) 0.4 MG SL tablet Place 1 tablet (0.4 mg total) under the tongue  every 5 (five) minutes as needed. 25 tablet 2   pantoprazole  (PROTONIX ) 20 MG tablet Take 1 tablet (20 mg total) by mouth daily. Take 30 minutes before breakfast 30 tablet 5   Probiotic Product (PROBIOTIC PO) Take by mouth.     topiramate  (TOPAMAX ) 25 MG tablet TAKE ONE TABLET BY MOUTH AT BEDTIME 90 tablet 1   traMADol  (ULTRAM ) 50 MG tablet Take 100 mg by mouth daily as needed.     TURMERIC PO Take by mouth.     Vitamin D -Vitamin K (D3 + K2 PO) Take 1 tablet by mouth daily.     Cholecalciferol (VITAMIN D3 PO) Take 850 mg by mouth 2 (two) times daily. (Patient not taking: Reported on 08/01/2024)     No facility-administered medications prior to visit.    PAST MEDICAL HISTORY: Past Medical History:  Diagnosis Date   Abnormal cardiovascular stress test 09/20/2013   Possibly abnormal nuclear stress at [place]-anteroseptal abnormality interpreted as possible breast attenuation as well, motion artifact. Normal EF. No TID. Watching medically   Allergy    Anxiety    Cataract    Diabetes (HCC) 09/20/2013   Diabetes mellitus (HCC)    Type 2   Essential hypertension 09/20/2013   GERD (gastroesophageal reflux disease)    H/O: hysterectomy    Hyperlipidemia 09/20/2013   RA (rheumatoid arthritis) (HCC)    Sleep apnea    uses cpap   Thyroid  disease    Thyroid  Nodule    PAST SURGICAL HISTORY: Past Surgical History:  Procedure Laterality Date   CARDIAC CATHETERIZATION  02/12/2015   LEFT HEART CATHETERIZATION WITH CORONARY ANGIOGRAM N/A 02/12/2015   Procedure: LEFT HEART CATHETERIZATION WITH CORONARY ANGIOGRAM;  Surgeon: Debby DELENA Sor, MD;  Location: [facility] CATH LAB;  Service: Cardiovascular;  Laterality: N/A;   TOTAL ABDOMINAL HYSTERECTOMY      FAMILY HISTORY: Family History  Problem Relation Age of Onset   Diabetes Mother    Colon cancer Neg Hx    Prostate cancer Neg Hx    Rectal cancer Neg Hx    Stomach cancer Neg Hx     SOCIAL HISTORY: Social History   Socioeconomic History    Marital status: Widowed    Spouse name: Zachary   Number of children: 2   Years of education: 12   Highest education level: Not on file  Occupational History   Occupation: Retired  Tobacco Use   Smoking status: Never   Smokeless tobacco: Current    Types: Snuff  Vaping Use   Vaping status: Never Used  Substance and Sexual Activity   Alcohol use: No    Alcohol/week: 0.0 standard drinks of alcohol   Drug  use: No   Sexual activity: Not Currently  Other Topics Concern   Not on file  Social History Narrative   Patient lives alone 03/04/17   Patient has 2 children.    Patient is retired.   Patient is right-handed.     Caffeine use: Drinks 1 cup coffee per day   Social Drivers of Health   Financial Resource Strain: Low Risk  (11/24/2022)   Received [facility]   Overall Financial Resource Strain (CARDIA)    Difficulty of Paying Living Expenses: Not hard at all  Food Insecurity: No Food Insecurity (08/09/2023)   Received from [facility]   Hunger Vital Sign    Within the past 12 months, you worried that your food would run out before you got the money to buy more.: Never true    Within the past 12 months, the food you bought just didn't last and you didn't have money to get more.: Never true  Transportation Needs: No Transportation Needs (08/09/2023)   Received from [facility]   PRAPARE - Transportation    Lack of Transportation (Medical): No    Lack of Transportation (Non-Medical): No  Physical Activity: Sufficiently Active (11/24/2022)   Received from [facility]   Exercise Vital Sign    On average, how many days per week do you engage in moderate to strenuous exercise (like a brisk walk)?: 6 days    On average, how many minutes do you engage in exercise at this level?: 120 min  Stress: No Stress Concern Present (11/24/2022)   Received from [facility] - Occupational Stress Questionnaire    Feeling of Stress : Not  at all  Social Connections: Moderately Integrated (11/24/2022)   Received from [facility]   Social Connection and Isolation Panel    In a typical week, how many times do you talk on the phone with family, friends, or neighbors?: More than three times a week    How often do you get together with friends or relatives?: More than three times a week    How often do you attend church or religious services?: More than 4 times per year    Do you belong to any clubs or organizations such as church groups, unions, fraternal or athletic groups, or school groups?: Yes    How often do you attend meetings of the clubs or organizations you belong to?: More than 4 times per year    Are you married, widowed, divorced, separated, never married, or living with a partner?: Widowed  Intimate Partner Violence: Not At Risk (11/24/2022)   Received from [facility]   Humiliation, Afraid, Rape, and Kick questionnaire    Within the last year, have you been afraid of your partner or ex-partner?: No    Within the last year, have you been humiliated or emotionally abused in other ways by your partner or ex-partner?: No    Within the last year, have you been kicked, hit, slapped, or otherwise physically hurt by your partner or ex-partner?: No    Within the last year, have you been raped or forced to have any kind of sexual activity by your partner or ex-partner?: No      PHYSICAL EXAM  Today's Vitals   08/01/24 0928  BP: (!) 140/87  Pulse: 86  Weight: 182 lb (82.6 kg)  Height: 5' 3 (1.6 m)   Body mass index is 32.24 kg/m.  No data to display          Generalized: Well developed, in no acute distress    Neurological examination  Mentation: Alert oriented to time, place, history taking. Follows all commands speech and language fluent Cranial nerve II-XII: extraocular movements were full, visual field were full on confrontational test. Facial sensation and strength were normal. . Uvula tongue  midline. Head turning and shoulder shrug  were normal and symmetric. Motor: The motor testing reveals 5 over 5 strength of all 4 extremities. Good symmetric motor tone is noted throughout.  Sensory: Sensory testing is intact to pinprick, soft touch, vibration sensation, and position sense on all 4 extremities. No evidence of extinction is noted.  Coordination: Cerebellar testing reveals good finger-nose-finger and heel-to-shin bilaterally.  Gait and station: Gait is normal.  Reflexes: Deep tendon reflexes are symmetric and normal bilaterally.     DIAGNOSTIC DATA (LABS, IMAGING, TESTING) - I reviewed patient records, labs, notes, testing and imaging myself where available.  Lab Results  Component Value Date   WBC 4.0 01/08/2022   HGB 13.2 01/08/2022   HCT 40.1 01/08/2022   MCV 89.6 01/08/2022   PLT 151.0 01/08/2022      Component Value Date/Time   NA 141 07/03/2024 1234   K 4.0 07/03/2024 1234   CL 106 07/03/2024 1234   CO2 29 07/03/2024 1234   GLUCOSE 108 (H) 07/03/2024 1234   BUN 15 07/03/2024 1234   CREATININE 1.13 (H) 07/03/2024 1234   CALCIUM  10.6 (H) 07/03/2024 1234   PROT 6.3 01/08/2022 1203   ALBUMIN 4.2 12/08/2022 1037   AST 15 01/08/2022 1203   ALT 12 01/08/2022 1203   ALKPHOS 67 01/08/2022 1203   BILITOT 0.5 01/08/2022 1203   GFRNONAA 49 (L) 09/27/2015 1159   GFRAA 56 (L) 09/27/2015 1159   Lab Results  Component Value Date   CHOL 184 10/17/2014   HDL 44.80 10/17/2014   LDLDIRECT 64.8 10/17/2014   TRIG 311.0 (H) 10/17/2014   CHOLHDL 4 10/17/2014   Lab Results  Component Value Date   HGBA1C 7.6 (A) 07/03/2024   No results found for: VITAMINB12 Lab Results  Component Value Date   TSH 4.03 04/30/2022      ASSESSMENT AND PLAN 89 y.o. year old female  has a past medical history of Abnormal cardiovascular stress test (09/20/2013), Allergy, Anxiety, Cataract, Diabetes (HCC) (09/20/2013), Diabetes mellitus (HCC), Essential hypertension (09/20/2013), GERD  (gastroesophageal reflux disease), H/O: hysterectomy, Hyperlipidemia (09/20/2013), RA (rheumatoid arthritis) (HCC), Sleep apnea, and Thyroid  disease. here with:  Essential tremor  - Continue Topamax  to 25 mg at bedtime.   - MMSE 23/30, advise if they feel her memory worsens we can get her back in for sooner appointment. - Follow-up in 1 year or sooner if needed   Duwaine Russell, MSN, NP-C 08/01/2024, 9:37 AM [facility] [address] [place], [place] [address] [phone]  The patient's condition requires frequent monitoring and adjustments in the treatment plan, reflecting the ongoing complexity of care.  This provider is the continuing focal point for all needed services for this condition.

## 2024-09-02 ENCOUNTER — Other Ambulatory Visit: Payer: Self-pay | Admitting: Cardiology

## 2024-09-11 ENCOUNTER — Other Ambulatory Visit: Payer: Self-pay

## 2024-09-11 MED ORDER — PANTOPRAZOLE SODIUM 20 MG PO TBEC: 20.0000 mg | DELAYED_RELEASE_TABLET | Freq: Every day | ORAL | 3 refills | Status: DC

## 2024-09-15 ENCOUNTER — Other Ambulatory Visit: Payer: Self-pay

## 2024-09-15 MED ORDER — PANTOPRAZOLE SODIUM 20 MG PO TBEC: 20.0000 mg | DELAYED_RELEASE_TABLET | Freq: Every day | ORAL | 3 refills | Status: AC

## 2024-12-12 ENCOUNTER — Telehealth: Payer: Self-pay | Admitting: Cardiology

## 2024-12-12 NOTE — Telephone Encounter (Signed)
" °*  STAT* If patient is at the pharmacy, call can be transferred to refill team.   1. Which medications need to be refilled? (please list name of each medication and dose if known)   atorvastatin  (LIPITOR) 20 MG tablet  sosorbide mononitrate (IMDUR ) 60 MG 24 hr tablet  diltiazem  (CARDIZEM  CD) 120 MG 24 hr capsule  2. Would you like to learn more about the convenience, safety, & potential cost savings by using the [facility]? No    3. Are you open to using the [facility] (Type [facility]. no   4. Which pharmacy/location (including street and city if local pharmacy) is medication to be sent to?   [facility] - [place], [place] - [place] - [address]   5. Do they need a 30 day or 90 day supply? 90 day    Pt is completely out.  "

## 2024-12-13 NOTE — Telephone Encounter (Signed)
 Pt scheduled for f/u 02/22/25

## 2024-12-14 MED ORDER — ISOSORBIDE MONONITRATE ER 60 MG PO TB24: 60.0000 mg | ORAL_TABLET | Freq: Every morning | ORAL | 0 refills | Status: AC

## 2024-12-14 MED ORDER — DILTIAZEM HCL ER COATED BEADS 120 MG PO CP24: 120.0000 mg | ORAL_CAPSULE | Freq: Every day | ORAL | 0 refills | Status: AC

## 2024-12-14 MED ORDER — ATORVASTATIN CALCIUM 20 MG PO TABS: 20.0000 mg | ORAL_TABLET | Freq: Every morning | ORAL | 0 refills | Status: AC

## 2024-12-14 NOTE — Telephone Encounter (Signed)
 Pt's medications were sent to pt's pharmacy as requested. Confirmation received.

## 2025-01-22 ENCOUNTER — Ambulatory Visit: Admitting: Internal Medicine

## 2025-03-14 ENCOUNTER — Ambulatory Visit: Admitting: Cardiology

## 2025-08-07 ENCOUNTER — Telehealth: Admitting: Adult Health
